# Patient Record
Sex: Male | Born: 1975
Health system: Southern US, Community
[De-identification: ages and names within clinical notes are randomized; demographics above are authoritative.]

## PROBLEM LIST (undated history)

## (undated) DIAGNOSIS — R600 Localized edema: Secondary | ICD-10-CM

## (undated) DIAGNOSIS — I10 Essential (primary) hypertension: Secondary | ICD-10-CM

## (undated) DIAGNOSIS — Z87442 Personal history of urinary calculi: Secondary | ICD-10-CM

## (undated) DIAGNOSIS — H269 Unspecified cataract: Secondary | ICD-10-CM

## (undated) DIAGNOSIS — E1121 Type 2 diabetes mellitus with diabetic nephropathy: Secondary | ICD-10-CM

## (undated) DIAGNOSIS — A630 Anogenital (venereal) warts: Secondary | ICD-10-CM

## (undated) DIAGNOSIS — N186 End stage renal disease: Secondary | ICD-10-CM

## (undated) DIAGNOSIS — C4490 Unspecified malignant neoplasm of skin, unspecified: Secondary | ICD-10-CM

## (undated) DIAGNOSIS — B348 Other viral infections of unspecified site: Secondary | ICD-10-CM

## (undated) DIAGNOSIS — Z7960 Long term (current) use of unspecified immunomodulators and immunosuppressants: Secondary | ICD-10-CM

## (undated) DIAGNOSIS — Z9889 Other specified postprocedural states: Secondary | ICD-10-CM

## (undated) DIAGNOSIS — Z79899 Other long term (current) drug therapy: Secondary | ICD-10-CM

## (undated) DIAGNOSIS — E109 Type 1 diabetes mellitus without complications: Secondary | ICD-10-CM

## (undated) DIAGNOSIS — E11319 Type 2 diabetes mellitus with unspecified diabetic retinopathy without macular edema: Secondary | ICD-10-CM

## (undated) HISTORY — PX: CYST REMOVAL TRUNK: SHX6283

## (undated) HISTORY — PX: KIDNEY TRANSPLANT: SHX239

## (undated) HISTORY — DX: Unspecified cataract: H26.9

## (undated) HISTORY — DX: Essential (primary) hypertension: I10

---
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
## 2007-05-19 ENCOUNTER — Emergency Department: Payer: Self-pay | Admitting: Emergency Medicine

## 2007-06-30 ENCOUNTER — Emergency Department: Payer: Self-pay | Admitting: Internal Medicine

## 2007-10-14 ENCOUNTER — Emergency Department: Payer: Self-pay | Admitting: Emergency Medicine

## 2007-10-15 ENCOUNTER — Emergency Department: Payer: Self-pay | Admitting: Emergency Medicine

## 2007-10-16 ENCOUNTER — Inpatient Hospital Stay: Payer: Self-pay | Admitting: Internal Medicine

## 2008-05-17 ENCOUNTER — Emergency Department: Payer: Self-pay | Admitting: Emergency Medicine

## 2009-06-11 ENCOUNTER — Emergency Department: Payer: Self-pay | Admitting: Emergency Medicine

## 2009-10-10 ENCOUNTER — Emergency Department: Payer: Self-pay | Admitting: Emergency Medicine

## 2010-08-09 ENCOUNTER — Other Ambulatory Visit: Payer: Self-pay | Admitting: Internal Medicine

## 2010-08-19 ENCOUNTER — Other Ambulatory Visit: Payer: Self-pay | Admitting: Internal Medicine

## 2010-11-15 ENCOUNTER — Other Ambulatory Visit: Payer: Self-pay | Admitting: Internal Medicine

## 2010-11-20 ENCOUNTER — Other Ambulatory Visit: Payer: Self-pay | Admitting: Internal Medicine

## 2011-03-08 ENCOUNTER — Emergency Department: Payer: Self-pay | Admitting: *Deleted

## 2012-09-18 ENCOUNTER — Emergency Department: Payer: Self-pay | Admitting: Emergency Medicine

## 2013-05-12 DIAGNOSIS — Z9841 Cataract extraction status, right eye: Secondary | ICD-10-CM

## 2013-05-12 HISTORY — DX: Cataract extraction status, right eye: Z98.41

## 2013-05-12 HISTORY — PX: CATARACT EXTRACTION W/ INTRAOCULAR LENS IMPLANT: SHX1309

## 2014-07-07 ENCOUNTER — Observation Stay: Payer: Self-pay | Admitting: Internal Medicine

## 2014-07-15 ENCOUNTER — Emergency Department: Payer: Self-pay | Admitting: Emergency Medicine

## 2014-08-09 ENCOUNTER — Ambulatory Visit: Payer: Self-pay | Admitting: Endocrinology

## 2014-09-10 NOTE — Discharge Summary (Signed)
PATIENT NAME:  Gabriel Huff, Gabriel Huff MR#:  [id number] DATE OF BIRTH:  10-Aug-1975  DATE OF ADMISSION:  07/07/2014 DATE OF DISCHARGE:  07/09/2014  DISCHARGE DIAGNOSES: 1.  Hypoglycemia, noncompliance. 2.  Acute on chronic kidney disease stage IV.    PROCEDURES: None.   CONSULTATIONS: Dr. Holley Raring, nephrology.  BRIEF HISTORY AND PHYSICAL AND HOSPITAL COURSE BASED ON PROBLEM:   Please review history and physical for details.  1.  Hypoglycemia. The patient is a 39 year old Caucasian male with a history of diabetes mellitus, dependent on insulin, who was brought into the ED as he was acting funny at his workplace and found to have a low blood sugar, in the mid 30s. The patient was given 0.5 amp of dextrose following which the blood sugar increased to 102, but again it dropped down to 55. The patient's BUN was at 34 and creatinine 3.58. The patient was having intermittent episodes of low blood sugars 2 days prior to the admission. He denies any urinary symptoms, nausea, vomiting. The patient was admitted to the hospital with the chief complaint of hypoglycemia. The patient was started on D5 half normal saline and blood sugars were monitored. His insulins were held. His Accu-Cheks were monitored closely, every 2 hours. The patient admitted he is not compliant with his insulin. He is supposed to take Humulin N 20 units in the a.m. and 15 units at bedtime, but admits to not taking insulin sometimes and sometimes he skips meals and takes insulin, which might be the reason for hypoglycemic episodes. By the next day, his hypoglycemia was resolved. D5 drip was discontinued The patient was started on NPH insulin at 10 units subcutaneously twice a day as he was having hypoglycemic episodes recently. The patient was not taking his meals on a regular basis. We have reinforced the importance of being compliant with his meals and taking insulin on a regular basis. The patient verbalized understanding. A consult with a dietician was  ordered to provide details about diabetic diet, but the patient has refused as he is familiar with diabetic diet. His NPH insulin was not available in the pharmacy and he was given Levemir and at the time of discharge he was asked to switch back to his home insulin NPH 10 units subcutaneous b.i.d. His diabetes mellitus was uncontrolled with hemoglobin A1c at 9, which is probably secondary to noncompliance.  2.  Hypertension. Blood pressure was elevated. His Coreg dose was increased to 12.5 mg, and nephrology has ordered losartan following which his blood pressure was better   3.  Renal failure. The patient has acute on chronic renal failure. His baseline creatinine was at 3.3. IV fluids were provided. Nephrotoxic agents were discontinued. Nephrology consult was placed. They have recommended the patient to follow up with them as an outpatient on a regular basis. The patient admitted that he is not seen by nephrology for more than 8 months because he does not have insurance. Dr. Elwyn Lade group is providing charity care and sliding scale insulin. We have encouraged the patient to follow up with nephrology as an outpatient. The patient is aware that Dr. Elwyn Lade nephrology group is offering a helping hand by doing charity care and sliding scale.  CONDITION AT DISCHARGE: The patient is discharged home in stable condition.  PHYSICAL EXAMINATION: VITAL SIGNS: At the time of discharge, his temperature is 97.8, pulse 78, respirations 18, blood pressure 134/80, and pulse ox is 98% on room air.  GENERAL APPEARANCE: Not in any distress. Moderately built and nourished.  HEENT: Normocephalic, atraumatic. Pupils are equally reacting to light and accommodation. No scleral icterus. No conjunctival injection. No sinus tenderness. No postnasal drip. Moist mucous membranes.  NECK: Supple. No JVD. No thyromegaly. Range of motion is intact.  LUNGS: Clear to auscultation bilaterally. No accessory muscle usage. No anterior chest  wall tenderness on palpation.  CARDIOVASCULAR: S1 and S2. Regular rate and rhythm. No murmurs.  GASTROINTESTINAL: Abdominal soft. Bowel sounds are positive. Nontender, nondistended. No masses.  NEUROLOGIC: Awake, alert, and oriented x3. Cranial nerves II through XII are grossly intact. Motor and sensory are intact. Reflexes are 2+.  EXTREMITIES: No edema. No cyanosis. No clubbing.  SKIN: Warm to touch. Normal turgor. No rashes. No lesions.   DIAGNOSTIC DATA: EKG: Normal EKG at 76 beats per minute.  On 27th February glucose 122, BUN 32, creatinine 3.55. Sodium and potassium are normal. Chloride 112. CO2 24. Anion gap 8.  Serum osmolality 95. Calcium 8.1. Hemoglobin A1c 9. Phosphorus 3. WBC 6.4, hemoglobin 9.8, hematocrit 39.8, platelets 231,000.  DISCHARGE MEDICATIONS: Gentlemen's therapeutic multivitamin 1 tablet p.o. once daily, Humulin N (NPH) 10  units subcutaneously 2 times a day, Coreg 12.5 mg p.o. b.i.d., losartan 50 mg p.o. once daily, insulin Aspart subcutaneously 3 times a day with meals. Follow protocol: Less than 150 no coverage, 151 to 200 two units, 201 to 250 four units, 251 to 300 six units; 301 to 350 eight units and 351 to  400 ten units.   DISCHARGE DIET: Low-sodium, carb-controlled. Regular consistency.   DISCHARGE ACTIVITY: As tolerated.   DISCHARGE INSTRUCTIONS: Follow up with primary care physician in a week, Dr. Juleen China from nephrology group in 2 to 4 weeks, outpatient diabetic clinic in a week.   The diagnosis and plan of care was discussed in detail with the patient who voiced understanding of the plan. Care management was consulted regarding medicine assistance and outpatient followup. All the patient's questions were answered.   TOTAL TIME SPENT ON DISCHARGE AND COORDINATION OF CARE: 45 minutes.  ____________________________ Nicholes Mango, MD ag:sb D: 07/13/2014 22:22:47 ET T: 07/14/2014 09:28:26 ET JOB#: [id number]  cc: Nicholes Mango, MD, <Dictator> Nicholes Mango  MD ELECTRONICALLY SIGNED 07/25/2014 20:43

## 2014-09-10 NOTE — H&P (Signed)
PATIENT NAME:  Gabriel Huff, Gabriel Huff MR#:  [id number] DATE OF BIRTH:  10/11/1975  DATE OF ADMISSION:  07/07/2014  REFERRING PHYSICIAN: Debbrah Alar, MD.  PRIMARY CARE PRACTITIONER: Nonlocal.   ADMITTING PHYSICIAN: Juluis Mire, MD    CHIEF COMPLAINT: Acting funny at his workplace and found to have low blood sugars of mid 30s.   HISTORY OF PRESENT ILLNESS: A 39 year old Caucasian male with a history of diabetes mellitus on insulin, hypertension, was brought by the EMS with a history of acting funny at workplace and was found to have blood glucose in the mid 30s. The patient was given 1/2 ampule of dextrose, following which her blood sugar increased to 102. On arrival to the Emergency Room, the patient was again noted to have blood sugars of 55. By the time he had arrived to the Emergency Room, he was alert, awake, and oriented, but he continues to run low blood sugars. Work-up in the Emergency Room revealed low blood sugars on Accu-Cheks, as mentioned earlier, and his Chem-7 was remarkable for renal failure with a BUN of 34 and a creatinine of 3.58. The patient was given further D50 ampules in the Emergency Room and currently, blood sugars are maintaining around 100-108. The patient is alert, awake, and oriented. The patient states that he took his usual doses of insulin this morning, but did not have adequate food intake and went to work, following which, the events happened. He did have an episode of low blood sugars about 2 days ago, during which time, EMS was called and found the blood sugars to be low. Regarding his renal insufficiency, the patient states that he has seen a nephrologist a few weeks ago and was told that  he has renal insufficiency and he is close to being initiated on dialysis and further details of the work-up are not known at this time. Denies any fever, cough. No history of any nausea, vomiting, diarrhea, or illness recently. No urinary symptoms. The patient is alert, awake,  and oriented x 3 and resting comfortably in the bed at this time.   PAST MEDICAL HISTORY: 1. Hypertension.  2. Diabetes mellitus type 2, on insulin.    PAST SURGICAL HISTORY:  Right breast surgery for gynecomastia.   ALLERGIES: No known drug allergies.   HOME MEDICATIONS:  1.  Carvedilol 6.5 mg tablet, 1 tablet orally 2 times a day.  2.  Centrum multivitamin tablet, 1 tablet orally once a day.  3.  Humulin N 20 units subcutaneously once in the morning.   FAMILY HISTORY: Mother with diabetes mellitus and coronary artery disease.   SOCIAL HISTORY: He is single, works as an Clinical biochemist, and denies any history of smoking, alcohol, or substance abuse.    REVIEW OF SYSTEMS: CONSTITUTIONAL: Negative for fever, chills. No fatigue. No generalized weakness. EYES: Negative for blurred vision, double vision. No pain. No redness. No discharge.  EARS, NOSE, AND THROAT: Negative for tinnitus, ear pain, hearing loss, epistaxis, nasal discharge, or difficulty swallowing.  RESPIRATORY: Negative for cough, wheezing, dyspnea, hemoptysis, or painful respiration.  CARDIOVASCULAR: Negative for chest pain, palpitations, dizziness, syncopal episodes, orthopnea, dyspnea on exertion, pedal edema.  GASTROINTESTINAL: Negative for nausea, vomiting, diarrhea, abdominal pain, hematemesis, melena, rectal bleeding, or GERD symptoms.  GENITOURINARY: Negative for dysuria, frequency, urgency, or hematuria.  ENDOCRINE: Negative for polyuria, nocturia, or heat or cold intolerance.  HEMATOLOGIC AND LYMPHATIC: Negative for anemia, easy bruising, bleeding, or swollen glands.  ENDOCRINE: Negative for acne, skin rash, or lesions.  MUSCULOSKELETAL:  Negative for neck or back pain. No history of arthritis or gout.  NEUROLOGICAL: Negative for focal weakness, numbness. No history of CVA, TIA, or seizure disorder.  PSYCHIATRIC: Negative for anxiety, insomnia, depression.   PHYSICAL EXAMINATION: VITAL SIGNS: Temperature 97.6,  respirations 20 per minute, blood pressure 191/106, and pulse oximetry 100% on room air.  GENERAL: Well-developed, well-nourished, alert, in no acute distress, comfortably resting in the bed.  HEAD: Atraumatic, normocephalic.  EYES: Pupils are equal, react to light and accommodation. No conjunctivae. No icterus. Extraocular movements intact.  NOSE: No drainage. No lesions.  EARS: No drainage. No external lesions.  ORAL CAVITY: No mucosal lesions. No exudates.  NECK: Supple. No JVD. No thyromegaly. No carotid bruit. Range of motion of neck within normal limits.  RESPIRATORY: Good respiratory effort. Not using accessory muscles of respiration. Bilateral vesicular breath sounds and no rales or rhonchi.  CARDIOVASCULAR: S1, S2 regular. No murmurs, gallops, or clicks. Peripheral pulses equal at carotid, femoral, and pedal pulses, no peripheral edema.  GASTROINTESTINAL: Abdomen is soft and nontender. No hepatosplenomegaly. No masses noted. No guarding. Bowel sounds present and equal in all 4 quadrants.  GENITOURINARY: Deferred.  MUSCULOSKELETAL: No joint tenderness or effusion. Range of motion is adequate. Strength and tone equal bilaterally.  SKIN: Inspection within normal limits. No obvious wounds.  LYMPHATIC: No cervical lymphadenopathy.  VASCULAR: Good dorsalis pedis and posterior tibial pulses.  NEUROLOGICAL: Alert, awake, and oriented x 3. Cranial nerves II through XII are grossly intact. No sensory deficit. Motor strength is 5/5 in both upper and lower extremities. DTR are 2+ bilaterally and symmetrical. Plantars downgoing.  PSYCHIATRIC: Alert, awake, and oriented x 3, Judgment and insight adequate. Memory and mood within normal limits.   ANCILLARY DATA:   LABORATORY DATA: Blood glucose and point of test Accu-Cheks 55 on arrival and repeat in 1 hour of 108, serum glucose 125, BUN 34, creatinine 3.58, sodium 143, potassium 4.4, chloride 110, bicarbonate 26, total calcium 8.5. WBC 7.1, hemoglobin  10.3, hematocrit 31.2, platelet count 235,000.   EKG normal sinus rhythm with ventricular rate of 76 beats per minute. No acute ST or T changes.   ASSESSMENT AND PLAN: A 39 year old Caucasian male with a history of diabetes mellitus type 2, hypertension, presents with acting funny at work, found to have low blood sugars in the mid-30s, also noted to have renal insufficiency, probably acute on chronic, admitted for observation for further evaluation and management.  1.  Hypoglycemia: Recurrent, secondary to inadequate oral intake following taking his usual dosage of insulin this morning.  Plan: Admit to medical floor for observation. Hold insulin for now. Begin D5 1/2 normal saline. Monitor blood sugars hourly and follow up accordingly.  2.  Renal failure: Likely acute on chronic. The patient was seen by nephrology a few weeks as an outpatient and was told he is close to being initiated on dialysis.  Work-up done at the nephrologist's office not known at this time. Plan: Gentle IV hydration. Follow BMP. Avoid nephrotoxic agents. Nephrology consultation requested.  3.  Diabetes mellitus on insulin: Now with hypoglycemia. Plan: We hold insulin for now and start sliding scale insulin.  4.  Hypertension: On medications, blood pressure poorly controlled. Plan:  Continue home medications and adjust medications for optimal control of blood pressure.  5.  Deep vein thrombosis prophylaxis subcutaneous heparin.  6. Gastrointestinal. PPI.   CODE STATUS: Full code.   TIME SPENT: 50 minutes.     ____________________________ Juluis Mire, MD enr:mw D: 07/07/2014  15:12:37 ET T: 07/07/2014 15:39:06 ET JOB#: [id number]  cc: Juluis Mire, MD, <Dictator> PRIMARY CARE PRACTITIONER Juluis Mire MD ELECTRONICALLY SIGNED 07/12/2014 8:38

## 2014-11-14 ENCOUNTER — Telehealth: Payer: Self-pay | Admitting: Unknown Physician Specialty

## 2014-11-14 DIAGNOSIS — N19 Unspecified kidney failure: Secondary | ICD-10-CM

## 2014-11-14 DIAGNOSIS — E1022 Type 1 diabetes mellitus with diabetic chronic kidney disease: Secondary | ICD-10-CM

## 2014-11-14 DIAGNOSIS — IMO0002 Reserved for concepts with insufficient information to code with codable children: Secondary | ICD-10-CM

## 2014-11-14 DIAGNOSIS — I10 Essential (primary) hypertension: Secondary | ICD-10-CM

## 2014-11-14 DIAGNOSIS — E1159 Type 2 diabetes mellitus with other circulatory complications: Secondary | ICD-10-CM | POA: Insufficient documentation

## 2014-11-14 DIAGNOSIS — Z940 Kidney transplant status: Secondary | ICD-10-CM | POA: Insufficient documentation

## 2014-11-14 DIAGNOSIS — I129 Hypertensive chronic kidney disease with stage 1 through stage 4 chronic kidney disease, or unspecified chronic kidney disease: Secondary | ICD-10-CM

## 2014-11-14 DIAGNOSIS — E119 Type 2 diabetes mellitus without complications: Secondary | ICD-10-CM | POA: Insufficient documentation

## 2014-11-14 DIAGNOSIS — E1065 Type 1 diabetes mellitus with hyperglycemia: Secondary | ICD-10-CM | POA: Insufficient documentation

## 2014-11-14 NOTE — Telephone Encounter (Signed)
Pt's father called in and stated that he was in last appt that he needed to go to a diabetic doctor and due to lack of insurance he was unable to until now. Pt now has insurance and is wanting a referral to go to [facility] to see dr. Gracy Bruins colum phone number is [phone]. Pt's dad is requesting a call back at [phone]. i scheduled an appt for pt July 15th just in case they needed to come in.

## 2014-11-14 NOTE — Telephone Encounter (Signed)
Routing to provider  

## 2014-11-15 NOTE — Telephone Encounter (Signed)
Called and let patient's father know about referral.

## 2014-11-15 NOTE — Telephone Encounter (Signed)
Referral entered  

## 2014-11-23 ENCOUNTER — Telehealth: Payer: Self-pay | Admitting: Unknown Physician Specialty

## 2014-11-23 NOTE — Telephone Encounter (Signed)
Routing to provider  

## 2014-11-24 ENCOUNTER — Ambulatory Visit: Payer: Medicaid Other | Admitting: Unknown Physician Specialty

## 2014-11-24 NOTE — Telephone Encounter (Signed)
Called and scheduled patient an appointment on 11/29/14.

## 2014-11-24 NOTE — Telephone Encounter (Signed)
Actually patient is wanting to establish with Gabriel Huff; I discharged him at last visit; discussed with office manager

## 2014-11-24 NOTE — Telephone Encounter (Signed)
Since I don't know him, I would have to see him.

## 2014-11-24 NOTE — Telephone Encounter (Signed)
Dr. Sanda Klein is his primary

## 2014-11-29 ENCOUNTER — Telehealth: Payer: Self-pay | Admitting: Unknown Physician Specialty

## 2014-11-29 ENCOUNTER — Ambulatory Visit: Payer: Self-pay | Admitting: Unknown Physician Specialty

## 2014-11-29 NOTE — Telephone Encounter (Signed)
Patient did not show up for the visit with Malachy Mood today. Would you be willing to write this?

## 2014-11-29 NOTE — Telephone Encounter (Signed)
Pt's father called stated that he needs Gabriel Huff to write a letter stating that the pt is unable to work so that he can get food stamp benefits. Pt's father stated he has 14% function in his kidneys. Letter would have to be on [place]. Please call if further information is needed. Pt is unable to work due to dialysis. Please call when letter is ready for pick up. Thanks.

## 2014-11-29 NOTE — Telephone Encounter (Signed)
I am forwarding to the office manager I ended my relationship with the patient at his last visit with me I only put in the referral in Cheryl's absence while she was out of the office

## 2014-11-30 ENCOUNTER — Ambulatory Visit
Admission: RE | Admit: 2014-11-30 | Discharge: 2014-11-30 | Disposition: A | Discharge status: Home / Self Care | Payer: Medicaid Other | Source: Ambulatory Visit | Attending: Vascular Surgery | Admitting: Vascular Surgery

## 2014-11-30 DIAGNOSIS — Z01812 Encounter for preprocedural laboratory examination: Secondary | ICD-10-CM | POA: Insufficient documentation

## 2014-11-30 DIAGNOSIS — N186 End stage renal disease: Secondary | ICD-10-CM | POA: Insufficient documentation

## 2014-11-30 DIAGNOSIS — I10 Essential (primary) hypertension: Secondary | ICD-10-CM

## 2014-11-30 DIAGNOSIS — I120 Hypertensive chronic kidney disease with stage 5 chronic kidney disease or end stage renal disease: Secondary | ICD-10-CM | POA: Diagnosis not present

## 2014-11-30 DIAGNOSIS — Z7982 Long term (current) use of aspirin: Secondary | ICD-10-CM | POA: Diagnosis not present

## 2014-11-30 DIAGNOSIS — Z01818 Encounter for other preprocedural examination: Secondary | ICD-10-CM | POA: Diagnosis present

## 2014-11-30 DIAGNOSIS — E1122 Type 2 diabetes mellitus with diabetic chronic kidney disease: Secondary | ICD-10-CM | POA: Insufficient documentation

## 2014-11-30 DIAGNOSIS — Z992 Dependence on renal dialysis: Secondary | ICD-10-CM | POA: Diagnosis not present

## 2014-11-30 DIAGNOSIS — Z794 Long term (current) use of insulin: Secondary | ICD-10-CM | POA: Diagnosis not present

## 2014-11-30 DIAGNOSIS — Z79899 Other long term (current) drug therapy: Secondary | ICD-10-CM | POA: Diagnosis not present

## 2014-11-30 DIAGNOSIS — E119 Type 2 diabetes mellitus without complications: Secondary | ICD-10-CM

## 2014-11-30 DIAGNOSIS — N185 Chronic kidney disease, stage 5: Secondary | ICD-10-CM | POA: Diagnosis present

## 2014-11-30 LAB — BASIC METABOLIC PANEL
ANION GAP: 6 (ref 5–15)
BUN: 65 mg/dL — AB (ref 6–20)
CALCIUM: 9.1 mg/dL (ref 8.9–10.3)
CO2: 25 mmol/L (ref 22–32)
Chloride: 108 mmol/L (ref 101–111)
Creatinine, Ser: 5.35 mg/dL — ABNORMAL HIGH (ref 0.61–1.24)
GFR calc Af Amer: 14 mL/min — ABNORMAL LOW (ref >60)
GFR calc non Af Amer: 12 mL/min — ABNORMAL LOW (ref >60)
GLUCOSE: 180 mg/dL — AB (ref 65–99)
POTASSIUM: 4.7 mmol/L (ref 3.5–5.1)
SODIUM: 139 mmol/L (ref 135–145)

## 2014-11-30 LAB — CBC
HCT: 27.6 % — ABNORMAL LOW (ref 40.0–52.0)
Hemoglobin: 9.3 g/dL — ABNORMAL LOW (ref 13.0–18.0)
MCH: 29.5 pg (ref 26.0–34.0)
MCHC: 33.7 g/dL (ref 32.0–36.0)
MCV: 87.7 fL (ref 80.0–100.0)
Platelets: 239 10*3/uL (ref 150–440)
RBC: 3.15 MIL/uL — ABNORMAL LOW (ref 4.40–5.90)
RDW: 13.7 % (ref 11.5–14.5)
WBC: 6.6 10*3/uL (ref 3.8–10.6)

## 2014-11-30 LAB — PROTIME-INR
INR: 1.05
Prothrombin Time: 13.9 seconds (ref 11.4–15.0)

## 2014-11-30 LAB — APTT: APTT: 27 s (ref 24–36)

## 2014-11-30 NOTE — Patient Instructions (Signed)
  Your procedure is scheduled on: Wednesday December 06, 2014 Report to Same Day Surgery. To find out your arrival time please call [phone] between 1PM - 3PM on Tuesday July 26. 2016.  Remember: Instructions that are not followed completely may result in serious medical risk, up to and including death, or upon the discretion of your surgeon and anesthesiologist your surgery may need to be rescheduled.    __x__ 1. Do not eat food or drink liquids after midnight. No gum chewing or hard candies.     ____ 2. No Alcohol for 24 hours before or after surgery.   ____ 3. Bring all medications with you on the day of surgery if instructed.    _x___ 4. Notify your doctor if there is any change in your medical condition     (cold, fever, infections).     Do not wear jewelry, make-up, hairpins, clips or nail polish.  Do not wear lotions, powders, or perfumes. You may wear deodorant.  Do not shave 48 hours prior to surgery. Men may shave face and neck.  Do not bring valuables to the hospital.    [facility] is not responsible for any belongings or valuables.               Contacts, dentures or bridgework may not be worn into surgery.  Leave your suitcase in the car. After surgery it may be brought to your room.  For patients admitted to the hospital, discharge time is determined by your  treatment team.   Patients discharged the day of surgery will not be allowed to drive home.    Please read over the following fact sheets that you were given:   [facility] Preparing for Surgery  __x__ Take these medicines the morning of surgery with A SIP OF WATER:    1. amLODipine (NORVASC)     ____ Fleet Enema (as directed)   __x__ Use CHG Soap as directed  ____ Use inhalers on the day of surgery  ____ Stop metformin 2 days prior to surgery    __x_ Take 1/2 of usual insulin dose the night before surgery and none on the morning of surgery.   ____ Stop Coumadin/Plavix/aspirin on does not  apply.  ____ Stop Anti-inflammatories on does not apply. Tylenol is ok to take for pain.   ____ Stop supplements until after surgery.    ____ Bring C-Pap to the hospital.

## 2014-12-05 ENCOUNTER — Ambulatory Visit (INDEPENDENT_AMBULATORY_CARE_PROVIDER_SITE_OTHER): Payer: Medicaid Other | Admitting: Unknown Physician Specialty

## 2014-12-05 ENCOUNTER — Telehealth: Payer: Self-pay | Admitting: Unknown Physician Specialty

## 2014-12-05 ENCOUNTER — Encounter: Payer: Self-pay | Admitting: Unknown Physician Specialty

## 2014-12-05 VITALS — BP 160/101 | HR 77 | Temp 97.9°F | Ht 67.2 in | Wt 154.4 lb

## 2014-12-05 DIAGNOSIS — N19 Unspecified kidney failure: Secondary | ICD-10-CM | POA: Diagnosis not present

## 2014-12-05 DIAGNOSIS — IMO0002 Reserved for concepts with insufficient information to code with codable children: Secondary | ICD-10-CM

## 2014-12-05 DIAGNOSIS — Z23 Encounter for immunization: Secondary | ICD-10-CM

## 2014-12-05 DIAGNOSIS — N189 Chronic kidney disease, unspecified: Secondary | ICD-10-CM

## 2014-12-05 DIAGNOSIS — I129 Hypertensive chronic kidney disease with stage 1 through stage 4 chronic kidney disease, or unspecified chronic kidney disease: Secondary | ICD-10-CM | POA: Diagnosis not present

## 2014-12-05 DIAGNOSIS — E1065 Type 1 diabetes mellitus with hyperglycemia: Secondary | ICD-10-CM | POA: Diagnosis not present

## 2014-12-05 DIAGNOSIS — E1022 Type 1 diabetes mellitus with diabetic chronic kidney disease: Secondary | ICD-10-CM | POA: Diagnosis not present

## 2014-12-05 MED ORDER — GLUCOSE BLOOD VI STRP: 1.0000 | ORAL_STRIP | Status: DC | PRN

## 2014-12-05 NOTE — Assessment & Plan Note (Signed)
Elevated.  Being followed by Dr. Abigail Butts

## 2014-12-05 NOTE — Assessment & Plan Note (Addendum)
Gave an appointment for an endocrinologist.

## 2014-12-05 NOTE — Progress Notes (Signed)
BP 160/101 mmHg  Pulse 77  Temp(Src) 97.9 F (36.6 C)  Ht 5' 7.2" (1.707 m)  Wt 154 lb 6.4 oz (70.035 kg)  BMI 24.04 kg/m2  SpO2 99%   Subjective:    Patient ID: Gabriel Mannan Sr., male    DOB: 20-May-1975, 39 y.o.   MRN: [id number]  HPI: Gabriel ETZEL Sr. is a 39 y.o. male  Chief Complaint  Patient presents with  . Letter for School/Work    pt states he needs a letter for food stamps because he is unable to work. Pt's father states pt is starting dialysis tomorrow   Pt is here with his father stating he is unable to work due to multiple medical problems so he can be eligible for food stamps.    Pt with poorly controlled DM and now on renal failure.  He is currently taking 16 units of Novolog and the other is on a sliding scale.  His nephrologist is currently managing his DM.  He is trying to find an Endocrinologist who will see Medicaid.    He is getting dialysis tomorrow.    Relevant past medical, surgical, family and social history reviewed and updated as indicated. Interim medical history since our last visit reviewed. Allergies and medications reviewed and updated.  Review of Systems  Per HPI unless specifically indicated above     Objective:    BP 160/101 mmHg  Pulse 77  Temp(Src) 97.9 F (36.6 C)  Ht 5' 7.2" (1.707 m)  Wt 154 lb 6.4 oz (70.035 kg)  BMI 24.04 kg/m2  SpO2 99%  Wt Readings from Last 3 Encounters:  12/05/14 154 lb 6.4 oz (70.035 kg)  11/30/14 155 lb (70.308 kg)  09/13/14 153 lb (69.4 kg)    Physical Exam  Constitutional: He is oriented to person, place, and time. He appears well-developed and well-nourished. No distress.  HENT:  Head: Normocephalic and atraumatic.  Eyes: Conjunctivae and lids are normal. Right eye exhibits no discharge. Left eye exhibits no discharge. No scleral icterus.  Cardiovascular: Normal rate, regular rhythm and normal heart sounds.   Pulmonary/Chest: Effort normal and breath sounds normal. No respiratory distress.   Abdominal: Normal appearance. There is no splenomegaly or hepatomegaly.  Musculoskeletal: Normal range of motion.  Neurological: He is alert and oriented to person, place, and time.  Skin: Skin is intact. No rash noted. No pallor.  Psychiatric: He has a normal mood and affect. His behavior is normal. Judgment and thought content normal.    Results for orders placed or performed during the hospital encounter of 11/30/14  CBC  Result Value Ref Range   WBC 6.6 3.8 - 10.6 K/uL   RBC 3.15 (L) 4.40 - 5.90 MIL/uL   Hemoglobin 9.3 (L) 13.0 - 18.0 g/dL   HCT 27.6 (L) 40.0 - 52.0 %   MCV 87.7 80.0 - 100.0 fL   MCH 29.5 26.0 - 34.0 pg   MCHC 33.7 32.0 - 36.0 g/dL   RDW 13.7 11.5 - 14.5 %   Platelets 239 150 - 440 K/uL  Basic metabolic panel  Result Value Ref Range   Sodium 139 135 - 145 mmol/L   Potassium 4.7 3.5 - 5.1 mmol/L   Chloride 108 101 - 111 mmol/L   CO2 25 22 - 32 mmol/L   Glucose, Bld 180 (H) 65 - 99 mg/dL   BUN 65 (H) 6 - 20 mg/dL   Creatinine, Ser 5.35 (H) 0.61 - 1.24 mg/dL   Calcium 9.1  8.9 - 10.3 mg/dL   GFR calc non Af Amer 12 (L) >60 mL/min   GFR calc Af Amer 14 (L) >60 mL/min   Anion gap 6 5 - 15  Protime-INR  Result Value Ref Range   Prothrombin Time 13.9 11.4 - 15.0 seconds   INR 1.05   APTT  Result Value Ref Range   aPTT 27 24 - 36 seconds      Assessment & Plan:   Problem List Items Addressed This Visit      Unprioritized   Renal failure   Relevant Orders   Tdap vaccine greater than or equal to 7yo IM   Pneumococcal polysaccharide vaccine 23-valent greater than or equal to 2yo subcutaneous/IM   Type I diabetes mellitus, uncontrolled - Primary    Gave an appointment for an endocrinologist.        Relevant Orders   Tdap vaccine greater than or equal to 7yo IM   Pneumococcal polysaccharide vaccine 23-valent greater than or equal to 2yo subcutaneous/IM   Hypertension associated with chronic kidney disease due to type 1 diabetes mellitus    Elevated.   Being followed by Dr. Renold Don for note.   Follow up plan: Return if symptoms worsen or fail to improve.  Since he is seeing nephrology and will see Endocrine, Inwill see him on an as needed basis

## 2014-12-05 NOTE — Telephone Encounter (Signed)
Routing to provider  

## 2014-12-05 NOTE — Telephone Encounter (Signed)
Pt's father called stated the pharmacy did not receive the RX for diabetic testing strips. Can RX be sent to [facility] on [address]. [place]. Thanks.

## 2014-12-06 ENCOUNTER — Encounter
Admission: RE | Disposition: A | Discharge status: Home / Self Care | Payer: Self-pay | Source: Ambulatory Visit | Attending: Vascular Surgery

## 2014-12-06 ENCOUNTER — Ambulatory Visit: Payer: Medicaid Other | Admitting: Certified Registered"

## 2014-12-06 ENCOUNTER — Ambulatory Visit
Admission: RE | Admit: 2014-12-06 | Discharge: 2014-12-06 | Disposition: A | Discharge status: Home / Self Care | Payer: Medicaid Other | Source: Ambulatory Visit | Attending: Vascular Surgery | Admitting: Vascular Surgery

## 2014-12-06 ENCOUNTER — Encounter: Payer: Self-pay | Admitting: *Deleted

## 2014-12-06 ENCOUNTER — Telehealth: Payer: Self-pay

## 2014-12-06 DIAGNOSIS — IMO0002 Reserved for concepts with insufficient information to code with codable children: Secondary | ICD-10-CM

## 2014-12-06 DIAGNOSIS — I120 Hypertensive chronic kidney disease with stage 5 chronic kidney disease or end stage renal disease: Secondary | ICD-10-CM | POA: Insufficient documentation

## 2014-12-06 DIAGNOSIS — E1065 Type 1 diabetes mellitus with hyperglycemia: Secondary | ICD-10-CM

## 2014-12-06 DIAGNOSIS — Z7982 Long term (current) use of aspirin: Secondary | ICD-10-CM | POA: Insufficient documentation

## 2014-12-06 DIAGNOSIS — Z794 Long term (current) use of insulin: Secondary | ICD-10-CM | POA: Insufficient documentation

## 2014-12-06 DIAGNOSIS — N185 Chronic kidney disease, stage 5: Secondary | ICD-10-CM | POA: Insufficient documentation

## 2014-12-06 DIAGNOSIS — Z79899 Other long term (current) drug therapy: Secondary | ICD-10-CM | POA: Insufficient documentation

## 2014-12-06 DIAGNOSIS — E1122 Type 2 diabetes mellitus with diabetic chronic kidney disease: Secondary | ICD-10-CM | POA: Insufficient documentation

## 2014-12-06 HISTORY — PX: CAPD INSERTION: SHX5233

## 2014-12-06 LAB — GLUCOSE, CAPILLARY
Glucose-Capillary: 343 mg/dL — ABNORMAL HIGH (ref 65–99)
Glucose-Capillary: 324 mg/dL — ABNORMAL HIGH (ref 65–99)
Glucose-Capillary: 134 mg/dL — ABNORMAL HIGH (ref 65–99)

## 2014-12-06 LAB — POTASSIUM: POTASSIUM: 4.6 mmol/L (ref 3.5–5.1)

## 2014-12-06 SURGERY — LAPAROSCOPIC INSERTION CONTINUOUS AMBULATORY PERITONEAL DIALYSIS  (CAPD) CATHETER
Anesthesia: General | Wound class: Clean Contaminated

## 2014-12-06 MED ORDER — BACITRACIN ZINC 500 UNIT/GM EX OINT
TOPICAL_OINTMENT | CUTANEOUS | Status: AC
  Filled 2014-12-06: qty 0.9

## 2014-12-06 MED ORDER — LIDOCAINE HCL (CARDIAC) 20 MG/ML IV SOLN
INTRAVENOUS | Status: DC | PRN
  Administered 2014-12-06: 100 mg via INTRAVENOUS

## 2014-12-06 MED ORDER — LIDOCAINE HCL 2 % IJ SOLN: 0.1000 mL | Freq: Once | INTRAMUSCULAR | Status: DC

## 2014-12-06 MED ORDER — PROPOFOL 10 MG/ML IV BOLUS
INTRAVENOUS | Status: DC | PRN
  Administered 2014-12-06: 150 mg via INTRAVENOUS

## 2014-12-06 MED ORDER — ROCURONIUM BROMIDE 100 MG/10ML IV SOLN
INTRAVENOUS | Status: DC | PRN
  Administered 2014-12-06: 40 mg via INTRAVENOUS

## 2014-12-06 MED ORDER — INSULIN ASPART PROT & ASPART (70-30 MIX) 100 UNIT/ML ~~LOC~~ SUSP
8.0000 [IU] | Freq: Once | SUBCUTANEOUS | Status: DC
  Filled 2014-12-06: qty 10

## 2014-12-06 MED ORDER — CEFAZOLIN SODIUM 1-5 GM-% IV SOLN
1.0000 g | Freq: Once | INTRAVENOUS | Status: AC
  Administered 2014-12-06: 1 g via INTRAVENOUS

## 2014-12-06 MED ORDER — SODIUM CHLORIDE 0.9 % IV SOLN
INTRAVENOUS | Status: DC
  Administered 2014-12-06: 12:00:00 via INTRAVENOUS

## 2014-12-06 MED ORDER — FENTANYL CITRATE (PF) 100 MCG/2ML IJ SOLN
INTRAMUSCULAR | Status: AC
Start: 2014-12-06 — End: 2014-12-06
  Administered 2014-12-06: 25 ug via INTRAVENOUS
  Filled 2014-12-06: qty 2

## 2014-12-06 MED ORDER — MIDAZOLAM HCL 2 MG/2ML IJ SOLN
INTRAMUSCULAR | Status: DC | PRN
  Administered 2014-12-06: 2 mg via INTRAVENOUS

## 2014-12-06 MED ORDER — SUGAMMADEX SODIUM 500 MG/5ML IV SOLN
INTRAVENOUS | Status: DC | PRN
  Administered 2014-12-06: 280 mg via INTRAVENOUS

## 2014-12-06 MED ORDER — FENTANYL CITRATE (PF) 100 MCG/2ML IJ SOLN
25.0000 ug | INTRAMUSCULAR | Status: DC | PRN
  Administered 2014-12-06 (×4): 25 ug via INTRAVENOUS

## 2014-12-06 MED ORDER — INSULIN ASPART 100 UNIT/ML ~~LOC~~ SOLN
SUBCUTANEOUS | Status: AC
  Administered 2014-12-06: 8 [IU] via SUBCUTANEOUS
  Filled 2014-12-06: qty 8

## 2014-12-06 MED ORDER — ONDANSETRON HCL 4 MG/2ML IJ SOLN
INTRAMUSCULAR | Status: AC
Start: 2014-12-06 — End: 2014-12-06
  Administered 2014-12-06: 4 mg via INTRAVENOUS
  Filled 2014-12-06: qty 2

## 2014-12-06 MED ORDER — BUPIVACAINE-EPINEPHRINE (PF) 0.25% -1:200000 IJ SOLN
INTRAMUSCULAR | Status: AC
  Filled 2014-12-06: qty 30

## 2014-12-06 MED ORDER — OXYCODONE-ACETAMINOPHEN 5-325 MG PO TABS
ORAL_TABLET | ORAL | Status: AC
  Administered 2014-12-06: 16:00:00
  Filled 2014-12-06: qty 1

## 2014-12-06 MED ORDER — PHENYLEPHRINE HCL 10 MG/ML IJ SOLN
INTRAMUSCULAR | Status: DC | PRN
  Administered 2014-12-06: 200 ug via INTRAVENOUS
  Administered 2014-12-06: 100 ug via INTRAVENOUS

## 2014-12-06 MED ORDER — FENTANYL CITRATE (PF) 100 MCG/2ML IJ SOLN
INTRAMUSCULAR | Status: DC | PRN
  Administered 2014-12-06 (×2): 50 ug via INTRAVENOUS

## 2014-12-06 MED ORDER — CEFAZOLIN SODIUM 1-5 GM-% IV SOLN
INTRAVENOUS | Status: AC
  Administered 2014-12-06: 1 g via INTRAVENOUS
  Filled 2014-12-06: qty 50

## 2014-12-06 MED ORDER — CEFAZOLIN (ANCEF) 1 G IV SOLR: 1.0000 g | INTRAVENOUS | Status: DC

## 2014-12-06 MED ORDER — ONDANSETRON HCL 4 MG/2ML IJ SOLN
INTRAMUSCULAR | Status: DC | PRN
  Administered 2014-12-06: 4 mg via INTRAVENOUS

## 2014-12-06 MED ORDER — ONDANSETRON HCL 4 MG/2ML IJ SOLN
4.0000 mg | Freq: Once | INTRAMUSCULAR | Status: AC | PRN
  Administered 2014-12-06: 4 mg via INTRAVENOUS

## 2014-12-06 SURGICAL SUPPLY — 34 items
ADAPTER CATH DIALYSIS 18.75 (CATHETERS) ×2 IMPLANT
ADAPTER CATH DIALYSIS 18.75CM (CATHETERS) ×1
BLADE CLIPPER SURG (BLADE) ×2 IMPLANT
BLADE SURG 15 STRL LF DISP TIS (BLADE) ×1 IMPLANT
BLADE SURG 15 STRL SS (BLADE) ×3
CANISTER SUCT 1200ML W/VALVE (MISCELLANEOUS) ×3 IMPLANT
CATH DLYS SWAN NECK 62.5CM (CATHETERS) ×3 IMPLANT
CHLORAPREP W/TINT 26ML (MISCELLANEOUS) ×3 IMPLANT
GLOVE SURG SYN 8.0 (GLOVE) ×9 IMPLANT
GLOVE SURG SYN 8.0 PF PI (GLOVE) ×1 IMPLANT
GOWN STRL REUS W/ TWL LRG LVL3 (GOWN DISPOSABLE) ×1 IMPLANT
GOWN STRL REUS W/ TWL XL LVL3 (GOWN DISPOSABLE) ×1 IMPLANT
GOWN STRL REUS W/TWL LRG LVL3 (GOWN DISPOSABLE) ×3
GOWN STRL REUS W/TWL XL LVL3 (GOWN DISPOSABLE) ×3
KIT RM TURNOVER STRD PROC AR (KITS) ×3 IMPLANT
LABEL OR SOLS (LABEL) ×3 IMPLANT
LIQUID BAND (GAUZE/BANDAGES/DRESSINGS) ×3 IMPLANT
MINICAP W/POVIDONE IODINE SOL (MISCELLANEOUS) ×3 IMPLANT
NDL SAFETY 25GX1.5 (NEEDLE) ×3 IMPLANT
NEEDLE INSUFFLATION 150MM (ENDOMECHANICALS) ×3 IMPLANT
NS IRRIG 500ML POUR BTL (IV SOLUTION) ×3 IMPLANT
PACK LAP CHOLECYSTECTOMY (MISCELLANEOUS) ×3 IMPLANT
PAD GROUND ADULT SPLIT (MISCELLANEOUS) ×3 IMPLANT
PENCIL ELECTRO HAND CTR (MISCELLANEOUS) ×3 IMPLANT
SET TRANSFER 6 W/TWIST CLAMP 5 (SET/KITS/TRAYS/PACK) ×3 IMPLANT
SLEEVE ENDOPATH XCEL 5M (ENDOMECHANICALS) ×3 IMPLANT
SUT ETHIBOND CT1 BRD 2-0 30IN (SUTURE) ×2 IMPLANT
SUT MNCRL AB 4-0 PS2 18 (SUTURE) ×3 IMPLANT
SUT VIC AB 3-0 SH 27 (SUTURE) ×3
SUT VIC AB 3-0 SH 27X BRD (SUTURE) ×1 IMPLANT
SUT VICRYL 0 AB UR-6 (SUTURE) ×6 IMPLANT
SYR 50ML LL SCALE MARK (SYRINGE) ×3 IMPLANT
TROCAR XCEL NON-BLD 5MMX100MML (ENDOMECHANICALS) ×3 IMPLANT
TUBING INSUFFLATOR HI FLOW (MISCELLANEOUS) ×3 IMPLANT

## 2014-12-06 NOTE — Op Note (Signed)
  OPERATIVE NOTE   PROCEDURE: 1. Laparoscopic peritoneal dialysis catheter placement.  PRE-OPERATIVE DIAGNOSIS: 1. Stage V renal insufficiency 2. Diabetes  POST-OPERATIVE DIAGNOSIS: Same  SURGEON: Schnier, Dolores Lory  ASSISTANT(S): None  ANESTHESIA: general  ESTIMATED BLOOD LOSS: Minimal   FINDING(S): 1. None  SPECIMEN(S): None  INDICATIONS:  Gabriel Tartaglione Police Sr. is a 39 y.o. male who presents with stage V renal insufficiency. The patient has decided to do peritoneal dialysis for his long-term dialysis. Risks and benefits of placement were discussed and he is agreeable to proceed.  Differences between peritoneal dialysis and hemodialysis were discussed.    DESCRIPTION: After obtaining full informed written consent, the patient was brought back to the operating room and placed supine upon the operating table. The patient received IV antibiotics prior to induction. After obtaining adequate anesthesia, the abdomen was prepped and draped in the standard fashion. A small transverse incision was created just to the left of the umbilicus and we dissected down to the fascia and placed a pursestring Vicryl suture. I then entered the peritoneum with an 60mm Optiview trocar placed in the right upper quadrant and insufflated the abdomen with carbon dioxide. I then entered the peritoneum just beside the umbilicus with a trocar and the peritoneal dialysis catheter under direct visualization. The coiled portion of the catheter was parked into the pelvis under direct laparoscopic guidance. The deep cuff was secured to the fascial pursestring suture. A small counterincision was made in the left abdomen and the catheter was brought out this site. The appropriate distal connectors were placed, and I then placed 200 cc of saline through the catheter into the pelvis. The abdomen was desufflated. Immediately, 200 cc of effluent returned through the catheter when the bag was placed to gravity. I took one  more look with the camera to ensure that the catheter was in the pelvis and it was. The 52mm trocar was then removed. I then closed the incisions with 3-0 Vicryl and 4-0 Monocryl and placed Dermabond as dressing. Dry dressing was placed around the catheter exit site. The patient was then awakened from anesthesia and taken to the recovery room in stable condition having tolerated the procedure well.  COMPLICATIONS: None  CONDITION: None  Gabriel Huff 12/06/2014, 2:44 PM

## 2014-12-06 NOTE — OR Nursing (Signed)
Blood sugar results given to Dr Boston Service

## 2014-12-06 NOTE — Transfer of Care (Signed)
Immediate Anesthesia Transfer of Care Note  Patient: Gabriel Stoetzel Brinkmeyer Sr.  Procedure(s) Performed: Procedure(s): LAPAROSCOPIC INSERTION CONTINUOUS AMBULATORY PERITONEAL DIALYSIS  (CAPD) CATHETER (N/A)  Patient Location: PACU  Anesthesia Type:General  Level of Consciousness: awake, alert , oriented and patient cooperative  Airway & Oxygen Therapy: Patient Spontanous Breathing and Patient connected to face mask oxygen  Post-op Assessment: Report given to RN, Post -op Vital signs reviewed and stable and Patient moving all extremities X 4  Post vital signs: Reviewed and stable  Last Vitals:  Filed Vitals:   12/06/14 1451  BP: 136/86  Pulse: 76  Temp:   Resp: 14    Complications: No apparent anesthesia complications

## 2014-12-06 NOTE — Op Note (Signed)
[facility] History & Physical Update  The patient was interviewed and re-examined.  The patient's previous History and Physical has been reviewed and is unchanged.  There is no change in the plan of care. We plan to proceed with the scheduled procedure.  Schnier, Dolores Lory, MD  12/06/2014, 1:25 PM

## 2014-12-06 NOTE — Anesthesia Procedure Notes (Signed)
Procedure Name: Intubation Date/Time: 12/06/2014 1:44 PM Performed by: Silvana Newness Pre-anesthesia Checklist: Patient identified, Emergency Drugs available, Suction available, Patient being monitored and Timeout performed Patient Re-evaluated:Patient Re-evaluated prior to inductionOxygen Delivery Method: Circle system utilized Preoxygenation: Pre-oxygenation with 100% oxygen Intubation Type: IV induction Ventilation: Mask ventilation without difficulty Laryngoscope Size: Mac and 4 Grade View: Grade II Tube type: Oral Tube size: 7.5 mm Number of attempts: 1 Airway Equipment and Method: Rigid stylet Placement Confirmation: ETT inserted through vocal cords under direct vision,  positive ETCO2 and breath sounds checked- equal and bilateral Secured at: 23 cm Tube secured with: Tape Dental Injury: Teeth and Oropharynx as per pre-operative assessment

## 2014-12-06 NOTE — Telephone Encounter (Signed)
Pharmacy called and stated they need an rx for the meter for the patient to test his sugar. The Accu Check plus. Pharmacy is [facility] in [place].

## 2014-12-06 NOTE — Anesthesia Postprocedure Evaluation (Signed)
  Anesthesia Post-op Note  Patient: Gabriel Speyrer Archbold Sr.  Procedure(s) Performed: Procedure(s): LAPAROSCOPIC INSERTION CONTINUOUS AMBULATORY PERITONEAL DIALYSIS  (CAPD) CATHETER (N/A)  Anesthesia type:General  Patient location: PACU  Post pain: Pain level controlled  Post assessment: Post-op Vital signs reviewed, Patient's Cardiovascular Status Stable, Respiratory Function Stable, Patent Airway and No signs of Nausea or vomiting  Post vital signs: Reviewed and stable  Last Vitals:  Filed Vitals:   12/06/14 1543  BP: 114/70  Pulse: 78  Temp: 36.4 C  Resp: 16    Level of consciousness: awake, alert  and patient cooperative  Complications: No apparent anesthesia complications Sugar down to 134 in PACU and doing well

## 2014-12-06 NOTE — Discharge Instructions (Addendum)

## 2014-12-06 NOTE — Anesthesia Preprocedure Evaluation (Addendum)
Anesthesia Evaluation  Patient identified by MRN, date of birth, ID band Patient awake    Reviewed: Allergy & Precautions, NPO status , Patient's Chart, lab work & pertinent test results  History of Anesthesia Complications Negative for: history of anesthetic complications  Airway Mallampati: II       Dental  (+) Teeth Intact   Pulmonary neg pulmonary ROS,    Pulmonary exam normal       Cardiovascular hypertension, Pt. on medications Normal cardiovascular exam    Neuro/Psych negative neurological ROS     GI/Hepatic negative GI ROS, Neg liver ROS,   Endo/Other  diabetes, Poorly Controlled, Type 1, Insulin Dependent  Renal/GU CRFRenal disease     Musculoskeletal negative musculoskeletal ROS (+)   Abdominal   Peds  Hematology negative hematology ROS (+)   Anesthesia Other Findings   Reproductive/Obstetrics                            Anesthesia Physical Anesthesia Plan  ASA: III  Anesthesia Plan: General   Post-op Pain Management:    Induction: Intravenous  Airway Management Planned: Oral ETT  Additional Equipment:   Intra-op Plan:   Post-operative Plan: Extubation in OR  Informed Consent: I have reviewed the patients History and Physical, chart, labs and discussed the procedure including the risks, benefits and alternatives for the proposed anesthesia with the patient or authorized representative who has indicated his/her understanding and acceptance.     Plan Discussed with: CRNA  Anesthesia Plan Comments:         Anesthesia Quick Evaluation

## 2014-12-11 ENCOUNTER — Other Ambulatory Visit: Payer: Self-pay

## 2014-12-11 ENCOUNTER — Other Ambulatory Visit: Payer: Self-pay | Admitting: Unknown Physician Specialty

## 2014-12-11 MED ORDER — ACCU-CHEK AVIVA PLUS W/DEVICE KIT: 1.0000 | PACK | Freq: Four times a day (QID) | Status: DC

## 2014-12-11 NOTE — Telephone Encounter (Signed)
E-fax came through for refill on: Rx: Oxycodone-acetaminophi [id number] aviva plus Rx: Denavir Rx: Valacyclovir Copy in basket.

## 2014-12-12 NOTE — Telephone Encounter (Signed)
Oxycodone was refilled on July 27th. Accu Check Meter was written on 12/11/14.

## 2014-12-27 ENCOUNTER — Telehealth: Payer: Self-pay

## 2014-12-27 NOTE — Telephone Encounter (Signed)
Patient would like referral to go to a DM doctor in [place].

## 2014-12-27 NOTE — Telephone Encounter (Signed)
I called patient back to find out who he was wanting to go see. He did not remember the person's name and stated that he had the info at home. He will call back with the provider and their phone number. Patient also no longer sees Dr. Sanda Klein, but is seeing Kathrine Haddock, [place].

## 2015-03-30 ENCOUNTER — Encounter: Payer: Self-pay | Admitting: Emergency Medicine

## 2015-03-30 ENCOUNTER — Ambulatory Visit (INDEPENDENT_AMBULATORY_CARE_PROVIDER_SITE_OTHER): Payer: Medicaid Other | Admitting: Unknown Physician Specialty

## 2015-03-30 ENCOUNTER — Encounter: Payer: Self-pay | Admitting: Unknown Physician Specialty

## 2015-03-30 ENCOUNTER — Emergency Department: Payer: Medicaid Other

## 2015-03-30 ENCOUNTER — Emergency Department
Admission: EM | Admit: 2015-03-30 | Discharge: 2015-03-30 | Disposition: A | Discharge status: Home / Self Care | Payer: Medicaid Other | Attending: Emergency Medicine | Admitting: Emergency Medicine

## 2015-03-30 VITALS — BP 127/84 | HR 86 | Temp 98.3°F | Ht 67.1 in | Wt 149.8 lb

## 2015-03-30 DIAGNOSIS — E1022 Type 1 diabetes mellitus with diabetic chronic kidney disease: Secondary | ICD-10-CM | POA: Diagnosis not present

## 2015-03-30 DIAGNOSIS — Y998 Other external cause status: Secondary | ICD-10-CM | POA: Insufficient documentation

## 2015-03-30 DIAGNOSIS — N189 Chronic kidney disease, unspecified: Secondary | ICD-10-CM | POA: Insufficient documentation

## 2015-03-30 DIAGNOSIS — M25532 Pain in left wrist: Secondary | ICD-10-CM

## 2015-03-30 DIAGNOSIS — S52502A Unspecified fracture of the lower end of left radius, initial encounter for closed fracture: Secondary | ICD-10-CM | POA: Insufficient documentation

## 2015-03-30 DIAGNOSIS — S52602A Unspecified fracture of lower end of left ulna, initial encounter for closed fracture: Secondary | ICD-10-CM | POA: Insufficient documentation

## 2015-03-30 DIAGNOSIS — Y9351 Activity, roller skating (inline) and skateboarding: Secondary | ICD-10-CM | POA: Diagnosis not present

## 2015-03-30 DIAGNOSIS — Y9289 Other specified places as the place of occurrence of the external cause: Secondary | ICD-10-CM | POA: Diagnosis not present

## 2015-03-30 DIAGNOSIS — S60212A Contusion of left wrist, initial encounter: Secondary | ICD-10-CM | POA: Insufficient documentation

## 2015-03-30 DIAGNOSIS — I129 Hypertensive chronic kidney disease with stage 1 through stage 4 chronic kidney disease, or unspecified chronic kidney disease: Secondary | ICD-10-CM | POA: Insufficient documentation

## 2015-03-30 DIAGNOSIS — S6992XA Unspecified injury of left wrist, hand and finger(s), initial encounter: Secondary | ICD-10-CM | POA: Diagnosis present

## 2015-03-30 MED ORDER — IBUPROFEN 800 MG PO TABS: 800.0000 mg | ORAL_TABLET | Freq: Three times a day (TID) | ORAL | Status: DC | PRN

## 2015-03-30 MED ORDER — IBUPROFEN 800 MG PO TABS
ORAL_TABLET | ORAL | Status: AC
  Administered 2015-03-30: 800 mg via ORAL
  Filled 2015-03-30: qty 1

## 2015-03-30 MED ORDER — OXYCODONE-ACETAMINOPHEN 5-325 MG PO TABS: 2.0000 | ORAL_TABLET | Freq: Four times a day (QID) | ORAL | Status: DC | PRN

## 2015-03-30 MED ORDER — OXYCODONE-ACETAMINOPHEN 5-325 MG PO TABS
2.0000 | ORAL_TABLET | Freq: Once | ORAL | Status: AC
  Administered 2015-03-30: 2 via ORAL

## 2015-03-30 MED ORDER — OXYCODONE-ACETAMINOPHEN 5-325 MG PO TABS
ORAL_TABLET | ORAL | Status: AC
  Administered 2015-03-30: 2 via ORAL
  Filled 2015-03-30: qty 2

## 2015-03-30 MED ORDER — IBUPROFEN 800 MG PO TABS
800.0000 mg | ORAL_TABLET | Freq: Once | ORAL | Status: AC
  Administered 2015-03-30: 800 mg via ORAL

## 2015-03-30 NOTE — ED Notes (Signed)
Pt states he was roller skating and fell on his left wrist yesterday. Large amount of swelling to that wrist

## 2015-03-30 NOTE — Discharge Instructions (Signed)
Radial Fracture  A radial fracture is a break in the radius bone, which is the long bone of the forearm that is on the same side as your thumb. Your forearm is the part of your arm that is between your elbow and your wrist. It is made up of two bones: the radius and the ulna.  Most radial fractures occur near the wrist (distal radialfracture) or near the elbow (radial head fracture). A distal radial fracture is the most common type of broken arm. This fracture usually occurs about an inch above the wrist. Fractures of the middle part of the bone are less common.  CAUSES   Falling with your arm outstretched is the most common cause of a radial fracture. Other causes include:   Car accidents.   Bike accidents.   A direct blow to the middle part of the radius.  RISK FACTORS   You may be at greater risk for a distal radial fracture if you are 60 years of age or older.   You may be at greater risk for a radial head fracture if you are:    Male.    30-40 years old.   You may be at a greater risk for all types of radial fractures if you have a condition that causes your bones to be weak or thin (osteoporosis).  SIGNS AND SYMPTOMS  A radial fracture causes pain immediately after the injury. Other signs and symptoms include:   An abnormal bend or bump in your arm (deformity).   Swelling.   Bruising.   Numbness or tingling.   Tenderness.   Limited movement.  DIAGNOSIS   Your health care provider may diagnose a radial fracture based on:   Your symptoms.   Your medical history, including any recent injury.   A physical exam. Your health care provider will look for any deformity and feel for tenderness over the break. Your health care provider will also check whether the bone is out of place.   An X-ray exam to confirm the diagnosis and learn more about the type of fracture.  TREATMENT  The goals of treatment are to get the bone in proper position for healing and to keep it from moving so it will heal over  time. Your treatment will depend on many factors, especially the type of fracture that you have.   If the fractured bone:    Is in the correct position (nondisplaced), you may only need to wear a cast or a splint.    Has a slightly displaced fracture, you may need to have the bones moved back into place manually (closed reduction) before the splint or cast is put on.   You may have a temporary splint before you have a plaster cast. The splint allows room for some swelling. After a few days, a cast can replace the splint.    You may have to wear the cast for about 6 weeks or as directed by your health care provider.    The cast may be changed after about 3 weeks or as directed by your health care provider.   After your cast is taken off, you may need physical therapy to regain full movement in your wrist or elbow.   You may need emergency surgery if you have:    A fractured bone that is out of position (displaced).    A fracture with multiple fragments (comminuted fracture).    A fracture that breaks the skin (open fracture). This type of   fracture may require surgical wires, plates, or screws to hold the bone in place.   You may have X-rays every couple of weeks to check on your healing.  HOME CARE INSTRUCTIONS   Keep the injured arm above the level of your heart while you are sitting or lying down. This helps to reduce swelling and pain.   Apply ice to the injured area:    Put ice in a plastic bag.    Place a towel between your skin and the bag.    Leave the ice on for 20 minutes, 2-3 times per day.   Move your fingers often to avoid stiffness and to minimize swelling.   If you have a plaster or fiberglass cast:    Do not try to scratch the skin under the cast using sharp or pointed objects.    Check the skin around the cast every day. You may put lotion on any red or sore areas.    Keep your cast dry and clean.   If you have a plaster splint:    Wear the splint as directed.    Loosen the elastic around  the splint if your fingers become numb and tingle, or if they turn cold and blue.   Do not put pressure on any part of your cast until it is fully hardened. Rest your cast only on a pillow for the first 24 hours.   Protect your cast or splint while bathing or showering, as directed by your health care provider. Do not put your cast or splint into water.   Take medicines only as directed by your health care provider.   Return to activities, such as sports, as directed by your health care provider. Ask your health care provider what activities are safe for you.   Keep all follow-up visits as directed by your health care provider. This is important.  SEEK MEDICAL CARE IF:   Your pain medicine is not helping.   Your cast gets damaged or it breaks.   Your cast becomes loose.   Your cast gets wet.   You have more severe pain or swelling than you did before the cast.   You have severe pain when stretching your fingers.   You continue to have pain or stiffness in your elbow or your wrist after your cast is taken off.  SEEK IMMEDIATE MEDICAL CARE IF:   You cannot move your fingers.   You lose feeling in your fingers or your hand.   Your hand or your fingers turn cold and pale or blue.   You notice a bad smell coming from your cast.   You have drainage from underneath your cast.   You have new stains from blood or drainage seeping through your cast.     This information is not intended to replace advice given to you by your health care provider. Make sure you discuss any questions you have with your health care provider.     Document Released: 10/09/2005 Document Revised: 05/19/2014 Document Reviewed: 10/21/2013  Elsevier Interactive Patient Education 2016 [employer]

## 2015-03-30 NOTE — ED Provider Notes (Signed)
[facility] Emergency Department Provider Note     Time seen: ----------------------------------------- 3:33 PM on 03/30/2015 -----------------------------------------    I have reviewed the triage vital signs and the nursing notes.   HISTORY  Chief Complaint No chief complaint on file.    HPI Gabriel Canner Allmon Sr. is a 39 y.o. male who presents ER after he fell roller skating last night. Patient states he fell with an outstretched hand and has severe pain with range of motion of his left wrist. He also has swelling of left wrist, doesn't have pain elsewhere. He denies any other injuries or complaints.   Past Medical History  Diagnosis Date  . Cataract     bilateral  . Diabetes mellitus without complication ([place])   . Hypertension   . Chronic kidney disease     Patient Active Problem List   Diagnosis Date Noted  . Poorly controlled type 1 diabetes mellitus ([place]) 12/05/2014  . Diabetes ([place]) 11/14/2014  . Renal failure 11/14/2014  . Type I diabetes mellitus, uncontrolled ([place]) 11/14/2014  . Hypertension associated with chronic kidney disease due to type 1 diabetes mellitus ([place]) 11/14/2014    Past Surgical History  Procedure Laterality Date  . Cyst removal trunk    . Cataract extraction w/ intraocular lens implant Bilateral 2015  . Gynecomastia excision  1993  . Capd insertion N/A 12/06/2014    Procedure: LAPAROSCOPIC INSERTION CONTINUOUS AMBULATORY PERITONEAL DIALYSIS  (CAPD) CATHETER;  Surgeon: Katha Cabal, MD;  Location: [facility] ORS;  Service: Vascular;  Laterality: N/A;    Allergies Losartan  Social History Social History  Substance Use Topics  . Smoking status: Never Smoker   . Smokeless tobacco: Never Used  . Alcohol Use: No    Review of Systems Constitutional: Negative for fever. Musculoskeletal: Positive for left wrist pain  Skin: There is dorsal wrist ecchymosis without laceration Neurological: Negative for headaches,  focal weakness or numbness.  10-point ROS otherwise negative.  ____________________________________________   PHYSICAL EXAM:  VITAL SIGNS: ED Triage Vitals  Enc Vitals Group     BP --      Pulse --      Resp --      Temp --      Temp src --      SpO2 --      Weight --      Height --      Head Cir --      Peak Flow --      Pain Score --      Pain Loc --      Pain Edu? --      Excl. in [place]? --     Constitutional: Alert and oriented. Well appearing and in no distress. Musculoskeletal: Left wrist swelling and tenderness, pain with range of motion of the left wrist or hand. Neurologic:  Normal speech and language. No gross focal neurologic deficits are appreciated. Speech is normal. No gait instability. Skin:  Skin is warm, dry and intact. Ecchymosis is noted dorsally on the left wrist ____________________________________________  ED COURSE:  Pertinent labs & imaging results that were available during my care of the patient were reviewed by me and considered in my medical decision making (see chart for details). Patient likely with distal radius fracture, will order pain medicine and x-rays of her left wrist.  RADIOLOGY Images were viewed by me  Left wrist x-rays IMPRESSION: 1. Minimally comminuted intra-articular distal radial fracture with neutral angulation of the distal radial articular  surface. 2. Ulnar styloid avulsion fracture. ____________________________________________  FINAL ASSESSMENT AND PLAN  Distal radius and ulnar styloid fracture  Plan: Patient with labs and imaging as dictated above. Patient fracture as dictated above, patient placed in a Velcro wrist splint. We discharged with pain medication and orthopedic referral.   Earleen Newport, MD   Earleen Newport, MD 03/30/15 [phone]

## 2015-03-30 NOTE — Progress Notes (Signed)
   BP 127/84 mmHg  Pulse 86  Temp(Src) 98.3 F (36.8 C)  Ht 5' 7.1" (1.704 m)  Wt 149 lb 12.8 oz (67.949 kg)  BMI 23.40 kg/m2  SpO2 100%   Subjective:    Patient ID: Gabriel Mannan Sr., male    DOB: 1975/12/01, 39 y.o.   MRN: [id number]  HPI: Gabriel THIM Sr. is a 39 y.o. male  Chief Complaint  Patient presents with  . Wrist Pain    pt states he has pain and swelling in left wrist. States he was roller skating and fell on it.   Injury happened last night.  Fell on outstretched arm.   Relevant past medical, surgical, family and social history reviewed and updated as indicated. Interim medical history since our last visit reviewed. Allergies and medications reviewed and updated.  Review of Systems  Per HPI unless specifically indicated above     Objective:    BP 127/84 mmHg  Pulse 86  Temp(Src) 98.3 F (36.8 C)  Ht 5' 7.1" (1.704 m)  Wt 149 lb 12.8 oz (67.949 kg)  BMI 23.40 kg/m2  SpO2 100%  Wt Readings from Last 3 Encounters:  03/30/15 149 lb 12.8 oz (67.949 kg)  11/30/14 155 lb (70.308 kg)  12/05/14 154 lb 6.4 oz (70.035 kg)    Physical Exam  Constitutional: He is oriented to person, place, and time. He appears well-developed and well-nourished. No distress.  HENT:  Head: Normocephalic and atraumatic.  Eyes: Conjunctivae and lids are normal. Right eye exhibits no discharge. Left eye exhibits no discharge. No scleral icterus.  Cardiovascular: Normal rate and regular rhythm.   Pulmonary/Chest: Effort normal. No respiratory distress.  Abdominal: Normal appearance. There is no splenomegaly or hepatomegaly.  Musculoskeletal:       Left wrist: He exhibits decreased range of motion, tenderness and swelling.  Very swollen left wrist that he is holding.    Neurological: He is alert and oriented to person, place, and time.  Skin: Skin is intact. No rash noted. No pallor.  Psychiatric: He has a normal mood and affect. His behavior is normal. Judgment and thought content  normal.    Results for orders placed or performed during the hospital encounter of 12/06/14  Glucose, capillary  Result Value Ref Range   Glucose-Capillary 343 (H) 65 - 99 mg/dL  Potassium  Result Value Ref Range   Potassium 4.6 3.5 - 5.1 mmol/L  Glucose, capillary  Result Value Ref Range   Glucose-Capillary 324 (H) 65 - 99 mg/dL  Glucose, capillary  Result Value Ref Range   Glucose-Capillary 134 (H) 65 - 99 mg/dL      Assessment & Plan:   Problem List Items Addressed This Visit    None    Visit Diagnoses    Wrist pain, acute, left    -  Primary    Refer to ER as late Friday afternoon for x-ray and spinting.          Follow up plan: Return for To ER.

## 2015-04-10 ENCOUNTER — Other Ambulatory Visit: Payer: Self-pay | Admitting: Nephrology

## 2015-04-10 DIAGNOSIS — Z7682 Awaiting organ transplant status: Secondary | ICD-10-CM

## 2015-04-16 ENCOUNTER — Ambulatory Visit: Payer: Medicaid Other

## 2015-04-23 ENCOUNTER — Ambulatory Visit: Payer: Medicaid Other

## 2015-06-21 ENCOUNTER — Ambulatory Visit: Payer: Medicaid Other | Admitting: Family Medicine

## 2015-07-27 ENCOUNTER — Ambulatory Visit: Payer: Medicaid Other | Admitting: Family Medicine

## 2015-07-27 ENCOUNTER — Encounter: Payer: Self-pay | Admitting: Emergency Medicine

## 2015-07-27 ENCOUNTER — Inpatient Hospital Stay
Admission: EM | Admit: 2015-07-27 | Discharge: 2015-07-28 | DRG: 637 | Disposition: A | Discharge status: Home / Self Care | Payer: Medicare Other | Attending: Internal Medicine | Admitting: Internal Medicine

## 2015-07-27 DIAGNOSIS — R0981 Nasal congestion: Secondary | ICD-10-CM | POA: Diagnosis present

## 2015-07-27 DIAGNOSIS — Z992 Dependence on renal dialysis: Secondary | ICD-10-CM | POA: Diagnosis not present

## 2015-07-27 DIAGNOSIS — Z888 Allergy status to other drugs, medicaments and biological substances status: Secondary | ICD-10-CM

## 2015-07-27 DIAGNOSIS — E860 Dehydration: Secondary | ICD-10-CM | POA: Diagnosis present

## 2015-07-27 DIAGNOSIS — E1110 Type 2 diabetes mellitus with ketoacidosis without coma: Secondary | ICD-10-CM | POA: Diagnosis present

## 2015-07-27 DIAGNOSIS — H269 Unspecified cataract: Secondary | ICD-10-CM | POA: Diagnosis present

## 2015-07-27 DIAGNOSIS — N186 End stage renal disease: Secondary | ICD-10-CM | POA: Diagnosis present

## 2015-07-27 DIAGNOSIS — E1022 Type 1 diabetes mellitus with diabetic chronic kidney disease: Secondary | ICD-10-CM | POA: Diagnosis present

## 2015-07-27 DIAGNOSIS — Z79899 Other long term (current) drug therapy: Secondary | ICD-10-CM | POA: Diagnosis not present

## 2015-07-27 DIAGNOSIS — D631 Anemia in chronic kidney disease: Secondary | ICD-10-CM | POA: Diagnosis present

## 2015-07-27 DIAGNOSIS — E871 Hypo-osmolality and hyponatremia: Secondary | ICD-10-CM | POA: Diagnosis present

## 2015-07-27 DIAGNOSIS — N2581 Secondary hyperparathyroidism of renal origin: Secondary | ICD-10-CM | POA: Diagnosis present

## 2015-07-27 DIAGNOSIS — Z79891 Long term (current) use of opiate analgesic: Secondary | ICD-10-CM

## 2015-07-27 DIAGNOSIS — K529 Noninfective gastroenteritis and colitis, unspecified: Secondary | ICD-10-CM | POA: Diagnosis present

## 2015-07-27 DIAGNOSIS — I959 Hypotension, unspecified: Secondary | ICD-10-CM | POA: Diagnosis present

## 2015-07-27 DIAGNOSIS — I120 Hypertensive chronic kidney disease with stage 5 chronic kidney disease or end stage renal disease: Secondary | ICD-10-CM | POA: Diagnosis present

## 2015-07-27 DIAGNOSIS — E10319 Type 1 diabetes mellitus with unspecified diabetic retinopathy without macular edema: Secondary | ICD-10-CM | POA: Diagnosis present

## 2015-07-27 DIAGNOSIS — R599 Enlarged lymph nodes, unspecified: Secondary | ICD-10-CM | POA: Diagnosis present

## 2015-07-27 DIAGNOSIS — E1010 Type 1 diabetes mellitus with ketoacidosis without coma: Principal | ICD-10-CM | POA: Diagnosis present

## 2015-07-27 HISTORY — DX: Type 1 diabetes mellitus without complications: E10.9

## 2015-07-27 HISTORY — DX: End stage renal disease: N18.6

## 2015-07-27 LAB — BASIC METABOLIC PANEL
ANION GAP: 17 — AB (ref 5–15)
ANION GAP: 9 (ref 5–15)
BUN: 64 mg/dL — ABNORMAL HIGH (ref 6–20)
BUN: 63 mg/dL — ABNORMAL HIGH (ref 6–20)
CALCIUM: 7.7 mg/dL — AB (ref 8.9–10.3)
CHLORIDE: 94 mmol/L — AB (ref 101–111)
CO2: 15 mmol/L — ABNORMAL LOW (ref 22–32)
CO2: 20 mmol/L — AB (ref 22–32)
CREATININE: 6.61 mg/dL — AB (ref 0.61–1.24)
CREATININE: 6.35 mg/dL — AB (ref 0.61–1.24)
Calcium: 7.7 mg/dL — ABNORMAL LOW (ref 8.9–10.3)
Chloride: 85 mmol/L — ABNORMAL LOW (ref 101–111)
GFR calc non Af Amer: 9 mL/min — ABNORMAL LOW (ref >60)
GFR calc non Af Amer: 10 mL/min — ABNORMAL LOW (ref >60)
GFR, EST AFRICAN AMERICAN: 11 mL/min — AB (ref >60)
GFR, EST AFRICAN AMERICAN: 11 mL/min — AB (ref >60)
Glucose, Bld: 1015 mg/dL (ref 65–99)
Glucose, Bld: 649 mg/dL (ref 65–99)
POTASSIUM: 4.6 mmol/L (ref 3.5–5.1)
Potassium: 3.6 mmol/L (ref 3.5–5.1)
SODIUM: 117 mmol/L — AB (ref 135–145)
SODIUM: 123 mmol/L — AB (ref 135–145)

## 2015-07-27 LAB — COMPREHENSIVE METABOLIC PANEL
ALT: 24 U/L (ref 17–63)
AST: 13 U/L — AB (ref 15–41)
Albumin: 2.9 g/dL — ABNORMAL LOW (ref 3.5–5.0)
Alkaline Phosphatase: 127 U/L — ABNORMAL HIGH (ref 38–126)
Anion gap: 17 — ABNORMAL HIGH (ref 5–15)
BUN: 66 mg/dL — ABNORMAL HIGH (ref 6–20)
CHLORIDE: 79 mmol/L — AB (ref 101–111)
CO2: 18 mmol/L — ABNORMAL LOW (ref 22–32)
Calcium: 7.8 mg/dL — ABNORMAL LOW (ref 8.9–10.3)
Creatinine, Ser: 6.49 mg/dL — ABNORMAL HIGH (ref 0.61–1.24)
GFR, EST AFRICAN AMERICAN: 11 mL/min — AB (ref >60)
GFR, EST NON AFRICAN AMERICAN: 10 mL/min — AB (ref >60)
Glucose, Bld: 1118 mg/dL (ref 65–99)
Potassium: 4.4 mmol/L (ref 3.5–5.1)
SODIUM: 114 mmol/L — AB (ref 135–145)
Total Bilirubin: 1.8 mg/dL — ABNORMAL HIGH (ref 0.3–1.2)
Total Protein: 5.6 g/dL — ABNORMAL LOW (ref 6.5–8.1)

## 2015-07-27 LAB — URINALYSIS COMPLETE WITH MICROSCOPIC (ARMC ONLY)
BACTERIA UA: NONE SEEN
Bilirubin Urine: NEGATIVE
Glucose, UA: >500 mg/dL — AB
Leukocytes, UA: NEGATIVE
NITRITE: NEGATIVE
PH: 5 (ref 5.0–8.0)
PROTEIN: NEGATIVE mg/dL
RBC / HPF: NONE SEEN RBC/hpf (ref 0–5)
SPECIFIC GRAVITY, URINE: 1.015 (ref 1.005–1.030)
Squamous Epithelial / LPF: NONE SEEN

## 2015-07-27 LAB — RAPID INFLUENZA A&B ANTIGENS (ARMC ONLY)
INFLUENZA A (ARMC): NEGATIVE
INFLUENZA B (ARMC): NEGATIVE

## 2015-07-27 LAB — GLUCOSE, CAPILLARY
GLUCOSE-CAPILLARY: 541 mg/dL — AB (ref 65–99)
GLUCOSE-CAPILLARY: 541 mg/dL — AB (ref 65–99)
Glucose-Capillary: >600 mg/dL (ref 65–99)

## 2015-07-27 LAB — BLOOD GAS, VENOUS
ACID-BASE DEFICIT: 7.8 mmol/L — AB (ref 0.0–2.0)
Bicarbonate: 18.9 mEq/L — ABNORMAL LOW (ref 21.0–28.0)
FIO2: 21
O2 SAT: 78.9 %
PATIENT TEMPERATURE: 37
PCO2 VEN: 43 mmHg — AB (ref 44.0–60.0)
pH, Ven: 7.25 — ABNORMAL LOW (ref 7.320–7.430)
pO2, Ven: 51 mmHg — ABNORMAL HIGH (ref 31.0–45.0)

## 2015-07-27 LAB — MAGNESIUM: MAGNESIUM: 1.8 mg/dL (ref 1.7–2.4)

## 2015-07-27 LAB — CBC
HEMATOCRIT: 25.4 % — AB (ref 40.0–52.0)
Hemoglobin: 8.2 g/dL — ABNORMAL LOW (ref 13.0–18.0)
MCH: 31.3 pg (ref 26.0–34.0)
MCHC: 32.4 g/dL (ref 32.0–36.0)
MCV: 96.5 fL (ref 80.0–100.0)
Platelets: 227 10*3/uL (ref 150–440)
RBC: 2.63 MIL/uL — AB (ref 4.40–5.90)
RDW: 16.3 % — ABNORMAL HIGH (ref 11.5–14.5)
WBC: 8 10*3/uL (ref 3.8–10.6)

## 2015-07-27 LAB — PHOSPHORUS: PHOSPHORUS: 4.5 mg/dL (ref 2.5–4.6)

## 2015-07-27 LAB — POCT RAPID STREP A: STREPTOCOCCUS, GROUP A SCREEN (DIRECT): NEGATIVE

## 2015-07-27 LAB — MRSA PCR SCREENING: MRSA by PCR: NEGATIVE

## 2015-07-27 LAB — LIPASE, BLOOD: LIPASE: 25 U/L (ref 11–51)

## 2015-07-27 MED ORDER — SODIUM CHLORIDE 0.9 % IV SOLN
INTRAVENOUS | Status: AC
  Administered 2015-07-27: 17:00:00 via INTRAVENOUS

## 2015-07-27 MED ORDER — ADULT MULTIVITAMIN W/MINERALS CH
1.0000 | ORAL_TABLET | Freq: Every day | ORAL | Status: DC
  Administered 2015-07-27 – 2015-07-28 (×2): 1 via ORAL
  Filled 2015-07-27 (×2): qty 1

## 2015-07-27 MED ORDER — CARVEDILOL 6.25 MG PO TABS
12.5000 mg | ORAL_TABLET | Freq: Two times a day (BID) | ORAL | Status: DC
  Administered 2015-07-27: 12.5 mg via ORAL
  Filled 2015-07-27 (×3): qty 2

## 2015-07-27 MED ORDER — ONDANSETRON HCL 4 MG/2ML IJ SOLN: 4.0000 mg | Freq: Four times a day (QID) | INTRAMUSCULAR | Status: DC

## 2015-07-27 MED ORDER — SODIUM CHLORIDE 0.9 % IV SOLN
INTRAVENOUS | Status: DC
  Administered 2015-07-27: 18:00:00 via INTRAVENOUS

## 2015-07-27 MED ORDER — AMLODIPINE BESYLATE 10 MG PO TABS
10.0000 mg | ORAL_TABLET | ORAL | Status: DC
  Filled 2015-07-27: qty 1

## 2015-07-27 MED ORDER — ENOXAPARIN SODIUM 40 MG/0.4ML ~~LOC~~ SOLN
40.0000 mg | SUBCUTANEOUS | Status: DC
  Administered 2015-07-27: 40 mg via SUBCUTANEOUS
  Filled 2015-07-27: qty 0.4

## 2015-07-27 MED ORDER — SODIUM CHLORIDE 0.9 % IV SOLN
1000.0000 mL | Freq: Once | INTRAVENOUS | Status: AC
  Administered 2015-07-27: 1000 mL via INTRAVENOUS

## 2015-07-27 MED ORDER — OXYCODONE-ACETAMINOPHEN 5-325 MG PO TABS: 2.0000 | ORAL_TABLET | Freq: Four times a day (QID) | ORAL | Status: DC | PRN

## 2015-07-27 MED ORDER — LOSARTAN POTASSIUM 50 MG PO TABS
50.0000 mg | ORAL_TABLET | Freq: Every day | ORAL | Status: DC
  Administered 2015-07-27: 50 mg via ORAL
  Filled 2015-07-27: qty 1

## 2015-07-27 MED ORDER — DEXTROSE-NACL 5-0.45 % IV SOLN
INTRAVENOUS | Status: DC
  Administered 2015-07-28: 03:00:00 via INTRAVENOUS

## 2015-07-27 MED ORDER — SODIUM CHLORIDE 0.9 % IV SOLN
INTRAVENOUS | Status: DC
  Administered 2015-07-27: 5.4 [IU]/h via INTRAVENOUS
  Filled 2015-07-27: qty 2.5

## 2015-07-27 MED ORDER — ONDANSETRON HCL 4 MG/2ML IJ SOLN
4.0000 mg | Freq: Four times a day (QID) | INTRAMUSCULAR | Status: DC | PRN
  Administered 2015-07-27: 4 mg via INTRAVENOUS
  Filled 2015-07-27: qty 2

## 2015-07-27 MED ORDER — ACCU-CHEK AVIVA PLUS W/DEVICE KIT: 1.0000 | PACK | Freq: Four times a day (QID) | Status: DC

## 2015-07-27 MED ORDER — POTASSIUM CHLORIDE 10 MEQ/100ML IV SOLN
10.0000 meq | INTRAVENOUS | Status: AC
Start: 2015-07-27 — End: 2015-07-27
  Administered 2015-07-27 (×2): 10 meq via INTRAVENOUS
  Filled 2015-07-27 (×2): qty 100

## 2015-07-27 NOTE — ED Provider Notes (Signed)
[facility] Emergency Department Provider Note  ____________________________________________    I have reviewed the triage vital signs and the nursing notes.   HISTORY  Chief Complaint Emesis    HPI Gabriel HARTT Sr. is a 40 y.o. male who presents with complaints of diffuse weakness, nausea vomiting and diarrhea. He reports this all started approximately 4 days ago with nausea vomiting and then progressed to diarrhea. He is diabetic but has not been taking his insulin because he has not been taking food in. Today he feels extremely weak and lightheaded. Denies fevers. Patient does do peritoneal dialysis for kidney disease     Past Medical History  Diagnosis Date  . Cataract     bilateral  . Diabetes mellitus without complication ([place])   . Hypertension   . Chronic kidney disease     Patient Active Problem List   Diagnosis Date Noted  . Poorly controlled type 1 diabetes mellitus ([place]) 12/05/2014  . Diabetes ([place]) 11/14/2014  . Renal failure 11/14/2014  . Type I diabetes mellitus, uncontrolled ([place]) 11/14/2014  . Hypertension associated with chronic kidney disease due to type 1 diabetes mellitus ([place]) 11/14/2014    Past Surgical History  Procedure Laterality Date  . Cyst removal trunk    . Cataract extraction w/ intraocular lens implant Bilateral 2015  . Gynecomastia excision  1993  . Capd insertion N/A 12/06/2014    Procedure: LAPAROSCOPIC INSERTION CONTINUOUS AMBULATORY PERITONEAL DIALYSIS  (CAPD) CATHETER;  Surgeon: Katha Cabal, MD;  Location: [facility] ORS;  Service: Vascular;  Laterality: N/A;    Current Outpatient Rx  Name  Route  Sig  Dispense  Refill  . amLODipine (NORVASC) 10 MG tablet   Oral   Take 10 mg by mouth every morning.         . Blood Glucose Monitoring Suppl (ACCU-CHEK AVIVA PLUS) W/DEVICE KIT   Does not apply   1 strip by Does not apply route 4 (four) times daily.   1 kit   12   . carvedilol (COREG) 12.5 MG  tablet   Oral   Take 12.5 mg by mouth 2 (two) times daily with a meal.         . furosemide (LASIX) 40 MG tablet   Oral   Take 40 mg by mouth every morning.      3   . glucose blood test strip   Other   1 each by Other route as needed for other. Tests 4 times/day.  E10.9   100 each   10   . ibuprofen (ADVIL,MOTRIN) 800 MG tablet   Oral   Take 1 tablet (800 mg total) by mouth every 8 (eight) hours as needed.   30 tablet   0   . insulin aspart (NOVOLOG) 100 UNIT/ML injection   Subcutaneous   Inject into the skin as needed for high blood sugar. Sliding scale insulin 0-150 no insulin 151-200 2 units 201-250 4 units 251-300 6 units 301-650 8 units 351-400 10 unit         . insulin NPH Human (HUMULIN N,NOVOLIN N) 100 UNIT/ML injection   Subcutaneous   Inject 16 Units into the skin 2 (two) times daily before a meal.          . losartan (COZAAR) 50 MG tablet   Oral   Take 50 mg by mouth daily.         . Multiple Vitamin (MULTIVITAMIN) tablet   Oral   Take 1 tablet by  mouth daily.         [name] oxyCODONE-acetaminophen (PERCOCET) 5-325 MG tablet   Oral   Take 2 tablets by mouth every 6 (six) hours as needed for moderate pain or severe pain.   30 tablet   0   . oxyCODONE-acetaminophen (PERCOCET/ROXICET) 5-325 MG per tablet   Oral   Take 1 tablet by mouth every 8 (eight) hours as needed.      0     Allergies Losartan  Family History  Problem Relation Age of Onset  . Diabetes Mother     Social History Social History  Substance Use Topics  . Smoking status: Never Smoker   . Smokeless tobacco: Never Used  . Alcohol Use: No    Review of Systems  Constitutional: Negative for fever. Eyes: Negative for redness ENT: Negative for sore throat, Positive for polydipsia Cardiovascular: Negative for chest pain Respiratory: Negative for shortness of breath. Gastrointestinal: As above, no abdominal pain Genitourinary: Positive for  frequency Musculoskeletal: Negative for joint pain or swelling Skin: Negative for rash. Neurological: Negative for focal weakness Psychiatric: no anxiety    ____________________________________________   PHYSICAL EXAM:  VITAL SIGNS: ED Triage Vitals  Enc Vitals Group     BP 07/27/15 1133 133/82 mmHg     Pulse Rate 07/27/15 1133 95     Resp 07/27/15 1133 20     Temp 07/27/15 1133 98.2 F (36.8 C)     Temp Source 07/27/15 1133 Oral     SpO2 07/27/15 1133 100 %     Weight 07/27/15 1133 150 lb (68.04 kg)     Height 07/27/15 1133 5' 8"  (1.727 m)     Head Cir --      Peak Flow --      Pain Score 07/27/15 1134 3     Pain Loc --      Pain Edu? --      Excl. in [place]? --      Constitutional: Alert and oriented. Well appearing and in no distress.  Eyes: Conjunctivae are normal. No erythema or injection ENT   Head: Normocephalic and atraumatic.   Mouth/Throat: Mucous membranes are dry Cardiovascular: Normal rate, regular rhythm. Normal and symmetric distal pulses are present in the upper extremities.  Respiratory: Normal respiratory effort without tachypnea nor retractions.  Gastrointestinal: Soft and non-tender in all quadrants. No distention. There is no CVA tenderness. Peritoneal dialysis catheter noted Genitourinary: deferred Musculoskeletal: Nontender with normal range of motion in all extremities. No lower extremity tenderness nor edema. Neurologic:  Normal speech and language. No gross focal neurologic deficits are appreciated. Skin:  Skin is warm, dry and intact. No rash noted. Psychiatric: Mood and affect are normal. Patient exhibits appropriate insight and judgment.  ____________________________________________    LABS (pertinent positives/negatives)  Labs Reviewed  COMPREHENSIVE METABOLIC PANEL - Abnormal; Notable for the following:    Sodium 114 (*)    Chloride 79 (*)    CO2 18 (*)    Glucose, Bld 1118 (*)    BUN 66 (*)    Creatinine, Ser 6.49 (*)     Calcium 7.8 (*)    Total Protein 5.6 (*)    Albumin 2.9 (*)    AST 13 (*)    Alkaline Phosphatase 127 (*)    Total Bilirubin 1.8 (*)    GFR calc non Af Amer 10 (*)    GFR calc Af Amer 11 (*)    Anion gap 17 (*)    All other components  within normal limits  CBC - Abnormal; Notable for the following:    RBC 2.63 (*)    Hemoglobin 8.2 (*)    HCT 25.4 (*)    RDW 16.3 (*)    All other components within normal limits  LIPASE, BLOOD  URINALYSIS COMPLETEWITH MICROSCOPIC ([facility] ONLY)  BLOOD GAS, VENOUS    ____________________________________________   EKG  None  ____________________________________________    RADIOLOGY  None  ____________________________________________   PROCEDURES  Procedure(s) performed: none  Critical Care performed: yes  CRITICAL CARE Performed by: Lavonia Drafts   Total critical care time: 30 minutes  Critical care time was exclusive of separately billable procedures and treating other patients.  Critical care was necessary to treat or prevent imminent or life-threatening deterioration.  Critical care was time spent personally by me on the following activities: development of treatment plan with patient and/or surrogate as well as nursing, discussions with consultants, evaluation of patient's response to treatment, examination of patient, obtaining history from patient or surrogate, ordering and performing treatments and interventions, ordering and review of laboratory studies, ordering and review of radiographic studies, pulse oximetry and re-evaluation of patient's condition.   ____________________________________________   INITIAL IMPRESSION / ASSESSMENT AND PLAN / ED COURSE  Pertinent labs & imaging results that were available during my care of the patient were reviewed by me and considered in my medical decision making (see chart for details).  Patient presents with diffuse weakness. He has a history of peritoneal dialysis and  diabetes. We will check labs, and reevaluate   Glucose is over 1000, kidney function has worsened as well, anion gap is elevated. We will check a VBG and start IV fluids. Patient will require admission  ____________________________________________   FINAL CLINICAL IMPRESSION(S) / ED DIAGNOSES  Final diagnoses:  Diabetic ketoacidosis without coma associated with type 1 diabetes mellitus ([place])          Lavonia Drafts, MD 07/27/15 [phone]

## 2015-07-27 NOTE — H&P (Signed)
[place] [facility] [place] NAME: Gabriel Huff    MR#:  [id number]  DATE OF BIRTH:  [birth year]  DATE OF ADMISSION:  07/27/2015  PRIMARY CARE PHYSICIAN: Kathrine Haddock, NP   REQUESTING/REFERRING PHYSICIAN: Dr Corky Downs  CHIEF COMPLAINT:   Hyperglycemia HISTORY OF PRESENT ILLNESS:  Gabriel Huff  is a 40 y.o. male with a known history of Insulin-requiring diabetes who presents with weakness and nausea and vomiting for the past 4 days. Patient has not been taking his insulin for several days due to above symptoms. He presents today with weakness and lightheadedness.  PAST MEDICAL HISTORY:   Past Medical History  Diagnosis Date  . Cataract     bilateral  . Diabetes mellitus without complication ([place])   . Hypertension   . Chronic kidney disease     PAST SURGICAL HISTORY:   Past Surgical History  Procedure Laterality Date  . Cyst removal trunk    . Cataract extraction w/ intraocular lens implant Bilateral 2015  . Gynecomastia excision  1993  . Capd insertion N/A 12/06/2014    Procedure: LAPAROSCOPIC INSERTION CONTINUOUS AMBULATORY PERITONEAL DIALYSIS  (CAPD) CATHETER;  Surgeon: Katha Cabal, MD;  Location: [facility] ORS;  Service: Vascular;  Laterality: N/A;    SOCIAL HISTORY:   Social History  Substance Use Topics  . Smoking status: Never Smoker   . Smokeless tobacco: Never Used  . Alcohol Use: No    FAMILY HISTORY:   Family History  Problem Relation Age of Onset  . Diabetes Mother     DRUG ALLERGIES:   Allergies  Allergen Reactions  . Losartan Swelling    Feet and ankles swell     REVIEW OF SYSTEMS:  CONSTITUTIONAL: No fever, fatigue ++generalized weakness.  EYES: No blurred or double vision.  EARS, NOSE, AND THROAT: No tinnitus or ear pain.  RESPIRATORY: No cough, shortness of breath, wheezing or hemoptysis.  CARDIOVASCULAR: No chest pain, orthopnea, edema.  GASTROINTESTINAL: ++ nausea, vomiting, diarrhea NO  abdominal pain.  GENITOURINARY: No dysuria, hematuria.  ENDOCRINE: No polyuria, nocturia,  HEMATOLOGY: No anemia, easy bruising or bleeding SKIN: No rash or lesion. MUSCULOSKELETAL: No joint pain or arthritis.   NEUROLOGIC: No tingling, numbness, weakness.  PSYCHIATRY: No anxiety or depression.   MEDICATIONS AT HOME:   Prior to Admission medications   Medication Sig Start Date End Date Taking? Authorizing Provider  amLODipine (NORVASC) 10 MG tablet Take 10 mg by mouth every morning.    Historical Provider, MD  Blood Glucose Monitoring Suppl (ACCU-CHEK AVIVA PLUS) W/DEVICE KIT 1 strip by Does not apply route 4 (four) times daily. 12/11/14   Kathrine Haddock, NP  carvedilol (COREG) 12.5 MG tablet Take 12.5 mg by mouth 2 (two) times daily with a meal.    Historical Provider, MD  furosemide (LASIX) 40 MG tablet Take 40 mg by mouth every morning. 02/27/15   Historical Provider, MD  glucose blood test strip 1 each by Other route as needed for other. Tests 4 times/day.  E10.9 12/05/14   Kathrine Haddock, NP  ibuprofen (ADVIL,MOTRIN) 800 MG tablet Take 1 tablet (800 mg total) by mouth every 8 (eight) hours as needed. 03/30/15   Earleen Newport, MD  insulin aspart (NOVOLOG) 100 UNIT/ML injection Inject into the skin as needed for high blood sugar. Sliding scale insulin 0-150 no insulin 151-200 2 units 201-250 4 units 251-300 6 units 301-650 8 units 351-400 10 unit    Historical Provider, MD  insulin NPH  Human (HUMULIN N,NOVOLIN N) 100 UNIT/ML injection Inject 16 Units into the skin 2 (two) times daily before a meal.     Historical Provider, MD  losartan (COZAAR) 50 MG tablet Take 50 mg by mouth daily.    Historical Provider, MD  Multiple Vitamin (MULTIVITAMIN) tablet Take 1 tablet by mouth daily.    Historical Provider, MD  oxyCODONE-acetaminophen (PERCOCET) 5-325 MG tablet Take 2 tablets by mouth every 6 (six) hours as needed for moderate pain or severe pain. 03/30/15   Earleen Newport, MD   oxyCODONE-acetaminophen (PERCOCET/ROXICET) 5-325 MG per tablet Take 1 tablet by mouth every 8 (eight) hours as needed. 12/06/14   Historical Provider, MD      VITAL SIGNS:  Blood pressure 149/86, pulse 94, temperature 98.2 F (36.8 C), temperature source Oral, resp. rate 11, height 5' 8"  (1.727 m), weight 68.04 kg (150 lb), SpO2 100 %.  PHYSICAL EXAMINATION:  GENERAL:  40 y.o.-year-old patient lying in the bed with no acute distress. weak EYES: Pupils equal, round, reactive to light and accommodation. No scleral icterus. Extraocular muscles intact.  HEENT: Head atraumatic, normocephalic. Oropharynx and nasopharynx clear.  NECK:  Supple, no jugular venous distention. No thyroid enlargement, no tenderness.  LUNGS: Normal breath sounds bilaterally, no wheezing, rales,rhonchi or crepitation. No use of accessory muscles of respiration.  CARDIOVASCULAR: S1, S2 normal. No murmurs, rubs, or gallops.  ABDOMEN: Soft, nontender, nondistended. Bowel sounds present. No organomegaly or mass.  EXTREMITIES: No pedal edema, cyanosis, or clubbing.  NEUROLOGIC: Cranial nerves II through XII are grossly intact. No focal deficits. PSYCHIATRIC: The patient is alert and oriented x 3.  SKIN: No obvious rash, lesion, or ulcer.   LABORATORY PANEL:   CBC  Recent Labs Lab 07/27/15 1143  WBC 8.0  HGB 8.2*  HCT 25.4*  PLT 227   ------------------------------------------------------------------------------------------------------------------  Chemistries   Recent Labs Lab 07/27/15 1143  NA 114*  K 4.4  CL 79*  CO2 18*  GLUCOSE 1118*  BUN 66*  CREATININE 6.49*  CALCIUM 7.8*  AST 13*  ALT 24  ALKPHOS 127*  BILITOT 1.8*   ------------------------------------------------------------------------------------------------------------------  Cardiac Enzymes No results for input(s): TROPONINI in the last 168  hours. ------------------------------------------------------------------------------------------------------------------  RADIOLOGY:  No results found.  EKG:   Orders placed or performed in visit on 07/07/14  . EKG 12-Lead    IMPRESSION AND PLAN:    40 year old male with a history of diabetes and peritoneal dialysis who presents with nausea vomiting for 4 days and found to have DKA.  1. DKA: Continue aggressive treatment with IV fluids and insulin drip. Endocrinology consultation for a.m. Follow BMP every 6 hours. Follow DKA protocol. Check hemoglobin A1c.  2. End-stage renal disease on peritoneal dialysis: Consult nephrology for peritoneal dialysis Creatinine is likely more elevated than usual due to dehydration from hyperglycemia. 3. Nausea, vomiting and diarrhea: Likely gastroenteritis.  4. Enlarged cervical lymph nodes: Check strep throat culture  5. Essential hypertension going continue Coreg and Norvasc and losartan.  All the records are reviewed and case discussed with ED provider. Management plans discussed with the patient and he is in agreement.  CODE STATUS: FULL  CRITICAL CARE TOTAL TIME TAKING CARE OF THIS PATIENT: 50 minutes.    Orenthal Debski M.D on 07/27/2015 at 3:13 PM  Between 7am to 6pm - Pager - [phone] After 6pm go to [email or web address] - password [email or web address] [facility]  Office  [phone]  CC: Primary care physician; Kathrine Haddock, NP

## 2015-07-27 NOTE — ED Notes (Signed)
Developed n/v/d since Tuesday  Has not been able to keep much down  Last time vomited was last pm  Also having some abd pain which is describes as burning

## 2015-07-27 NOTE — ED Notes (Signed)
Admitting MD in room at this time

## 2015-07-27 NOTE — Progress Notes (Signed)
[place] Progress Note Patient Name: Gabriel Huff. DOB: [birth year] MRN: [id number]   Date of Service  07/27/2015  HPI/Events of Note  New admission for DKA Stable on camera check  eICU Interventions  No eICU intervention     Intervention Category Evaluation Type: New Patient Evaluation  Simonne Maffucci 07/27/2015, 5:43 PM

## 2015-07-27 NOTE — Consult Note (Signed)
ENDOCRINOLOGY CONSULTATION  REFERRING PHYSICIAN:  Bettey Costa, MD CONSULTING PHYSICIAN:  A. Lavone Orn, MD.  CHIEF COMPLAINT:  Diabetic ketoacidosis  HISTORY OF PRESENT ILLNESS:  40 y.o. male with type 1 diabetes, end stage renal disease on peritoneal dialysis was admitted today with 4 days of N/V, diarrhea and inability to tolerate PO. Found to have initial BG 111, bicarb 18, AG 17 consistent with diabetic ketoacidosis. He has been started on IVF. IV insulin ordered, not yet delivered. He is conversive and able to provide a history. Explains onset of symptoms was on Tuesday and he had progressive weakness. Has not taken insulin or checked blood sugars in several days. Thought he didn't need insulin if he was not eating.   Diabetes is complicated by end stage renal disease. He has been doing dialysis since 01/2015.  He follows with Dr. Graceann Congress, [facility] for diabetes. Home regimen for diabetes is: Lantus 15 units each morning NovoLog 3 units tid AC + SSI (1 unit per sugar of 100 over a target of 200)  PAST MEDICAL HISTORY:  Past Medical History  Diagnosis Date  . Cataract     bilateral  . Type 1 diabetes ([place])   . Hypertension   . End stage renal disease ([place])     peritoneal dialysis since 01/2015     CURRENT MEDICATIONS:  . [START ON 07/28/2015] amLODipine  10 mg Oral [id number]  . carvedilol  12.5 mg Oral BID WC  . enoxaparin (LOVENOX) injection  40 mg Subcutaneous Q24H  . losartan  50 mg Oral Daily  . multivitamin with minerals  1 tablet Oral Daily  . potassium chloride  10 mEq Intravenous Q1H     SOCIAL HISTORY:  Social History  Substance Use Topics  . Smoking status: Never Smoker   . Smokeless tobacco: Never Used  . Alcohol Use: No     FAMILY HISTORY:   Family History  Problem Relation Age of Onset  . Diabetes Mother      ALLERGIES:  Allergies  Allergen Reactions  . Losartan Swelling and Other (See  Comments)    Reaction:  Feet/ankle swelling     REVIEW OF SYSTEMS:  GENERAL:  No weight loss.  No fever.  HEENT:  No blurred vision. No sore throat.  NECK:  No neck pain or dysphagia.  CARDIAC:  No chest pain or palpitation.  PULMONARY:  No cough or shortness of breath.  ABDOMEN:  No abdominal pain.  No constipation. EXTREMITIES:  No lower extremity swelling.  ENDOCRINE:  No heat or cold intolerance.  GENITOURINARY:  No dysuria or hematuria. SKIN:  No recent rash or skin changes.   PHYSICAL EXAMINATION:  BP 149/86 mmHg  Pulse 94  Temp(Src) 98.2 F (36.8 C) (Oral)  Resp 11  Ht 5\' 8"  (1.727 m)  Wt 68.04 kg (150 lb)  BMI 22.81 kg/m2  SpO2 100%  GENERAL:  Well-developed male in NAD. Appears ill. Tired.  HEENT:  EOMI.  Oropharynx is clear. Mucus membranes are dry. NECK:  Supple.  No thyromegaly.  No neck tenderness.  LYMPH: No anterior cervical or supraclavicular LAD.  CARDIAC:  Regular rate and rhythm without murmur.  PULMONARY:  Clear to auscultation bilaterally. No wheeze. ABDOMEN:  Mildly tender diffusely, soft, nondistended. +BS. EXTREMITIES:  No peripheral edema is present.    SKIN:  No rash or dermatopathy. A bilateral bare foot exam shows no calluses or ulcerations. NEUROLOGIC:  No dysarthria.  No tremor. PSYCHIATRIC:  Drowsy. Oriented, calm, cooperative.  LABORATORY DATA:  Results for orders placed or performed during the hospital encounter of 07/27/15 (from the past 24 hour(s))  Lipase, blood     Status: None   Collection Time: 07/27/15 11:43 AM  Result Value Ref Range   Lipase 25 11 - 51 U/L  Comprehensive metabolic panel     Status: Abnormal   Collection Time: 07/27/15 11:43 AM  Result Value Ref Range   Sodium 114 (LL) 135 - 145 mmol/L   Potassium 4.4 3.5 - 5.1 mmol/L   Chloride 79 (L) 101 - 111 mmol/L   CO2 18 (L) 22 - 32 mmol/L   Glucose, Bld 1118 (HH) 65 - 99 mg/dL   BUN 66 (H) 6 - 20 mg/dL   Creatinine, Ser 6.49 (H) 0.61 - 1.24 mg/dL   Calcium 7.8  (L) 8.9 - 10.3 mg/dL   Total Protein 5.6 (L) 6.5 - 8.1 g/dL   Albumin 2.9 (L) 3.5 - 5.0 g/dL   AST 13 (L) 15 - 41 U/L   ALT 24 17 - 63 U/L   Alkaline Phosphatase 127 (H) 38 - 126 U/L   Total Bilirubin 1.8 (H) 0.3 - 1.2 mg/dL   GFR calc non Af Amer 10 (L) >60 mL/min   GFR calc Af Amer 11 (L) >60 mL/min   Anion gap 17 (H) 5 - 15  CBC     Status: Abnormal   Collection Time: 07/27/15 11:43 AM  Result Value Ref Range   WBC 8.0 3.8 - 10.6 K/uL   RBC 2.63 (L) 4.40 - 5.90 MIL/uL   Hemoglobin 8.2 (L) 13.0 - 18.0 g/dL   HCT 25.4 (L) 40.0 - 52.0 %   MCV 96.5 80.0 - 100.0 fL   MCH 31.3 26.0 - 34.0 pg   MCHC 32.4 32.0 - 36.0 g/dL   RDW 16.3 (H) 11.5 - 14.5 %   Platelets 227 150 - 440 K/uL  Urinalysis complete, with microscopic ([facility] only)     Status: Abnormal   Collection Time: 07/27/15  2:00 PM  Result Value Ref Range   Color, Urine STRAW (A) YELLOW   APPearance CLEAR (A) CLEAR   Glucose, UA >500 (A) NEGATIVE mg/dL   Bilirubin Urine NEGATIVE NEGATIVE   Ketones, ur TRACE (A) NEGATIVE mg/dL   Specific Gravity, Urine 1.015 1.005 - 1.030   Hgb urine dipstick 1+ (A) NEGATIVE   pH 5.0 5.0 - 8.0   Protein, ur NEGATIVE NEGATIVE mg/dL   Nitrite NEGATIVE NEGATIVE   Leukocytes, UA NEGATIVE NEGATIVE   RBC / HPF NONE SEEN 0 - 5 RBC/hpf   WBC, UA 0-5 0 - 5 WBC/hpf   Bacteria, UA NONE SEEN NONE SEEN   Squamous Epithelial / LPF NONE SEEN NONE SEEN   Mucous PRESENT   Blood gas, venous     Status: Abnormal   Collection Time: 07/27/15  2:00 PM  Result Value Ref Range   FIO2 21.00    pH, Ven 7.25 (L) 7.320 - 7.430   pCO2, Ven 43 (L) 44.0 - 60.0 mmHg   pO2, Ven 51.0 (H) 31.0 - 45.0 mmHg   Bicarbonate 18.9 (L) 21.0 - 28.0 mEq/L   Acid-base deficit 7.8 (H) 0.0 - 2.0 mmol/L   O2 Saturation 78.9 %   Patient temperature 37.0    Collection site VEIN    Sample type VEIN   Rapid Influenza A&B Antigens ([facility] only)     Status: None   Collection Time: 07/27/15  3:32 PM  Result Value Ref Range  Influenza A ([place]) NEGATIVE NEGATIVE   Influenza B ([facility]) NEGATIVE NEGATIVE  Glucose, capillary     Status: Abnormal   Collection Time: 07/27/15  4:14 PM  Result Value Ref Range   Glucose-Capillary >600 (HH) 65 - 99 mg/dL  POCT rapid strep A [facility])     Status: None   Collection Time: 07/27/15  4:19 PM  Result Value Ref Range   Streptococcus, Group A Screen (Direct) NEGATIVE NEGATIVE   06/07/15 -- Hgb A1c 10.8%  ASSESSMENT:  1. Diabetic ketoacidosis, severe 2.   Uncontrolled type 1 diabetes  3.   Type 1 diabetes with diabetic nephropathy 4.   Type 1 diabetes with end stage renal disease on dialysis 5.   Probable gastroenteritis  PLAN: 1. Agree with admitting MD plan for agressive hydration and IV insulin per DKA protocol.  Transition IVF to D5NS after BG <250, as planned.  2. Keep NPO until transitioned to SQ insulin. Once giving patient a diet, be sure to order carb controlled and NO CONCENTRATED SWEETS, NO JUICE.  3. Once AG has closed and sugars stable per protocol, transition to SQ insulin as per out-pt dosing of: Lantus 18 units qAM NovoLog 3 units tid AC NovoLog correction - 1 unit per BG of 100 over a target of 200 qAC 4. Agree with need for Nephrology consult  Endocrinology service is unavailable over the upcoming weekend. Will see patient in follow up on Monday 07/30/15 if he remains in hospital.

## 2015-07-28 LAB — GLUCOSE, CAPILLARY
GLUCOSE-CAPILLARY: 430 mg/dL — AB (ref 65–99)
GLUCOSE-CAPILLARY: 76 mg/dL (ref 65–99)
GLUCOSE-CAPILLARY: 80 mg/dL (ref 65–99)
Glucose-Capillary: 301 mg/dL — ABNORMAL HIGH (ref 65–99)
Glucose-Capillary: 205 mg/dL — ABNORMAL HIGH (ref 65–99)
Glucose-Capillary: 143 mg/dL — ABNORMAL HIGH (ref 65–99)
Glucose-Capillary: 122 mg/dL — ABNORMAL HIGH (ref 65–99)
Glucose-Capillary: 95 mg/dL (ref 65–99)

## 2015-07-28 LAB — BASIC METABOLIC PANEL
ANION GAP: 8 (ref 5–15)
BUN: 64 mg/dL — ABNORMAL HIGH (ref 6–20)
CO2: 20 mmol/L — ABNORMAL LOW (ref 22–32)
Calcium: 7.7 mg/dL — ABNORMAL LOW (ref 8.9–10.3)
Chloride: 103 mmol/L (ref 101–111)
Creatinine, Ser: 6.62 mg/dL — ABNORMAL HIGH (ref 0.61–1.24)
GFR calc Af Amer: 11 mL/min — ABNORMAL LOW (ref >60)
GFR, EST NON AFRICAN AMERICAN: 9 mL/min — AB (ref >60)
Glucose, Bld: 118 mg/dL — ABNORMAL HIGH (ref 65–99)
POTASSIUM: 3.4 mmol/L — AB (ref 3.5–5.1)
SODIUM: 131 mmol/L — AB (ref 135–145)

## 2015-07-28 LAB — HEMOGLOBIN A1C: HEMOGLOBIN A1C: 10.5 % — AB (ref 4.0–6.0)

## 2015-07-28 MED ORDER — ENOXAPARIN SODIUM 30 MG/0.3ML ~~LOC~~ SOLN: 30.0000 mg | SUBCUTANEOUS | Status: DC

## 2015-07-28 MED ORDER — INSULIN GLARGINE 100 UNIT/ML ~~LOC~~ SOLN: 18.0000 [IU] | Freq: Every day | SUBCUTANEOUS | Status: DC

## 2015-07-28 MED ORDER — GUAIFENESIN ER 600 MG PO TB12
600.0000 mg | ORAL_TABLET | Freq: Two times a day (BID) | ORAL | Status: DC
  Administered 2015-07-28: 600 mg via ORAL
  Filled 2015-07-28: qty 1

## 2015-07-28 MED ORDER — INSULIN GLARGINE 100 UNIT/ML ~~LOC~~ SOLN
18.0000 [IU] | Freq: Every day | SUBCUTANEOUS | Status: DC
  Administered 2015-07-28: 18 [IU] via SUBCUTANEOUS
  Filled 2015-07-28: qty 0.18

## 2015-07-28 MED ORDER — INSULIN ASPART 100 UNIT/ML ~~LOC~~ SOLN: 3.0000 [IU] | Freq: Three times a day (TID) | SUBCUTANEOUS | Status: DC

## 2015-07-28 MED ORDER — GUAIFENESIN ER 600 MG PO TB12: 600.0000 mg | ORAL_TABLET | Freq: Two times a day (BID) | ORAL | Status: DC

## 2015-07-28 NOTE — Progress Notes (Signed)
[facility]  ROUNDING NOTE   Subjective:  Patient well known to [place] as we follow him for outpatient peritoneal dialysis. He reports that he began having nausea and vomiting on Tuesday of this past week. This persisted until his admission. Since he was not having any significant by mouth intake he was not taking insulin. He presented with diabetic ketoacidosis. He states that he was performing his peritoneal dialysis at home. No cloudy fluid at home. Blood glucose was 1118 when he first presented. CBC showed normal white count.  Objective:  Vital signs in last 24 hours:  Temp:  [98.2 F (36.8 C)-99.4 F (37.4 C)] 98.7 F (37.1 C) (03/18 0400) Pulse Rate:  [77-105] 78 (03/18 0600) Resp:  [10-20] 12 (03/18 0600) BP: (79-159)/(57-99) 111/67 mmHg (03/18 0600) SpO2:  [98 %-100 %] 99 % (03/18 0600) Weight:  [68.04 kg (150 lb)] 68.04 kg (150 lb) (03/17 1133)  Weight change:  Filed Weights   07/27/15 1133  Weight: 68.04 kg (150 lb)    Intake/Output: I/O last 3 completed shifts: In: 3723.5 [I.V.:3523.5; IV Piggyback:200] Out: 400 [Urine:400]   Intake/Output this shift:     Physical Exam: General: NAD, resting in bed  Head: Normocephalic, atraumatic. Moist oral mucosal membranes  Eyes: Anicteric, PERRL  Neck: Supple, trachea midline  Lungs:  Clear to auscultation normal effort  Heart: Regular rate and rhythm, no rubs  Abdomen:  Soft, nontender, BS present, PD catheter in place, no drainage noted  Extremities: no peripheral edema.  Neurologic: Nonfocal, moving all four extremities  Skin: No lesions  Access: PD catheter in place    Basic Metabolic Panel:  Recent Labs Lab 07/27/15 1143 07/27/15 1645 07/27/15 2202  NA 114* 117* 123*  K 4.4 4.6 3.6  CL 79* 85* 94*  CO2 18* 15* 20*  GLUCOSE 1118* 1015* 649*  BUN 66* 64* 63*  CREATININE 6.49* 6.61* 6.35*  CALCIUM 7.8* 7.7* 7.7*  MG  --   --  1.8  PHOS  --   --  4.5    Liver Function Tests:  Recent  Labs Lab 07/27/15 1143  AST 13*  ALT 24  ALKPHOS 127*  BILITOT 1.8*  PROT 5.6*  ALBUMIN 2.9*    Recent Labs Lab 07/27/15 1143  LIPASE 25   No results for input(s): AMMONIA in the last 168 hours.  CBC:  Recent Labs Lab 07/27/15 1143  WBC 8.0  HGB 8.2*  HCT 25.4*  MCV 96.5  PLT 227    Cardiac Enzymes: No results for input(s): CKTOTAL, CKMB, CKMBINDEX, TROPONINI in the last 168 hours.  BNP: Invalid input(s): POCBNP  CBG:  Recent Labs Lab 07/28/15 0304 07/28/15 0358 07/28/15 0506 07/28/15 0611 07/28/15 0702  GLUCAP 143* 122* 95 76 80    Microbiology: Results for orders placed or performed during the hospital encounter of 07/27/15  Rapid Influenza A&B Antigens ([facility] only)     Status: None   Collection Time: 07/27/15  3:32 PM  Result Value Ref Range Status   Influenza A ([facility]) NEGATIVE NEGATIVE Final   Influenza B ([facility]) NEGATIVE NEGATIVE Final  MRSA PCR Screening     Status: None   Collection Time: 07/27/15  4:26 PM  Result Value Ref Range Status   MRSA by PCR NEGATIVE NEGATIVE Final    Comment:        The GeneXpert MRSA Assay (FDA approved for NASAL specimens only), is one component of a comprehensive MRSA colonization surveillance program. It is not intended to diagnose  MRSA infection nor to guide or monitor treatment for MRSA infections.     Coagulation Studies: No results for input(s): LABPROT, INR in the last 72 hours.  Urinalysis:  Recent Labs  07/27/15 1400  COLORURINE STRAW*  LABSPEC 1.015  PHURINE 5.0  GLUCOSEU >500*  HGBUR 1+*  BILIRUBINUR NEGATIVE  KETONESUR TRACE*  PROTEINUR NEGATIVE  NITRITE NEGATIVE  LEUKOCYTESUR NEGATIVE      Imaging: No results found.   Medications:   . dextrose 5 % and 0.45% NaCl 50 mL/hr at 07/28/15 0600   . amLODipine  10 mg Oral BH-q7a  . carvedilol  12.5 mg Oral BID WC  . enoxaparin (LOVENOX) injection  30 mg Subcutaneous Q24H  . guaiFENesin  600 mg Oral BID  . insulin aspart  3  Units Subcutaneous TID WC  . insulin glargine  18 Units Subcutaneous Daily  . losartan  50 mg Oral Daily  . multivitamin with minerals  1 tablet Oral Daily   ondansetron (ZOFRAN) IV, oxyCODONE-acetaminophen  Assessment/ Plan:  40 y.o. male with diabetes mellitus type 1, diabetic retinopathy, hypertension, ESRD on PD, SHPTH, anemia of CKD.   1. End-stage renal disease on peritoneal dialysis. Patient seen and evaluated at bedside. He presented with diabetic ketoacidosis. He had nausea and vomiting at home and was not taking his insulin. He was however performing his peritoneal dialysis at home. He denies any cloudy fluid at home. His exit site is unremarkable. If he is not discharged today we will proceed with peritoneal dialysis.  2. Diabetic ketoacidosis. Patient has been appropriately treated with insulin drip and his blood sugars have significantly improved. Further management per hospitalist.  3. Anemia of chronic kidney disease. Hemoglobin noted as being 8.2. He will continue with Epogen as an outpatient.  4. Secondary hyperparathyroidism. Phosphorus currently 4.5 and acceptable. Continue to monitor bone mineral metabolism parameters.  5. Thanks for consultation.    LOS: 1 Karyl Sharrar 3/18/20177:33 AM

## 2015-07-28 NOTE — Progress Notes (Signed)
Patient ID: Gabriel Mannan Sr., male   DOB: 1975/12/02, 40 y.o.   MRN: [id number] [place] [facility] [place] NAME: Gabriel Huff    MR#:  [id number]  DATE OF BIRTH:  Dec 22, 1975  SUBJECTIVE:  Complains of sinus congestion. Feels hungry.  REVIEW OF SYSTEMS:   Review of Systems  Constitutional: Negative for fever, chills and weight loss.  HENT: Positive for congestion. Negative for ear discharge, ear pain and nosebleeds.   Eyes: Negative for blurred vision, pain and discharge.  Respiratory: Negative for sputum production, shortness of breath, wheezing and stridor.   Cardiovascular: Negative for chest pain, palpitations, orthopnea and PND.  Gastrointestinal: Negative for nausea, vomiting, abdominal pain and diarrhea.  Genitourinary: Negative for urgency and frequency.  Musculoskeletal: Negative for back pain and joint pain.  Neurological: Positive for weakness. Negative for sensory change, speech change and focal weakness.  Psychiatric/Behavioral: Negative for depression and hallucinations. The patient is not nervous/anxious.   All other systems reviewed and are negative.  Tolerating Diet: Yes Tolerating PT: Not needed  DRUG ALLERGIES:   Allergies  Allergen Reactions  . Losartan Swelling and Other (See Comments)    Reaction:  Feet/ankle swelling     VITALS:  Blood pressure 111/67, pulse 78, temperature 98.7 F (37.1 C), temperature source Oral, resp. rate 12, height 5\' 8"  (1.727 m), weight 68.04 kg (150 lb), SpO2 99 %.  PHYSICAL EXAMINATION:   Physical Exam  GENERAL:  40 y.o.-year-old patient lying in the bed with no acute distress.  EYES: Pupils equal, round, reactive to light and accommodation. No scleral icterus. Extraocular muscles intact.  HEENT: Head atraumatic, normocephalic. Oropharynx and nasopharynx clear.  NECK:  Supple, no jugular venous distention. No thyroid enlargement, no tenderness.  LUNGS: Normal breath sounds  bilaterally, no wheezing, rales, rhonchi. No use of accessory muscles of respiration.  CARDIOVASCULAR: S1, S2 normal. No murmurs, rubs, or gallops.  ABDOMEN: Soft, nontender, nondistended. Bowel sounds present. No organomegaly or mass.  EXTREMITIES: No cyanosis, clubbing or edema b/l.    NEUROLOGIC: Cranial nerves II through XII are intact. No focal Motor or sensory deficits b/l.   PSYCHIATRIC:  patient is alert and oriented x 3.  SKIN: No obvious rash, lesion, or ulcer.   LABORATORY PANEL:  CBC  Recent Labs Lab 07/27/15 1143  WBC 8.0  HGB 8.2*  HCT 25.4*  PLT 227    Chemistries   Recent Labs Lab 07/27/15 1143  07/27/15 2202  NA 114*  < > 123*  K 4.4  < > 3.6  CL 79*  < > 94*  CO2 18*  < > 20*  GLUCOSE 1118*  < > 649*  BUN 66*  < > 63*  CREATININE 6.49*  < > 6.35*  CALCIUM 7.8*  < > 7.7*  MG  --   --  1.8  AST 13*  --   --   ALT 24  --   --   ALKPHOS 127*  --   --   BILITOT 1.8*  --   --   < > = values in this interval not displayed.   40 year old male with a history of diabetes and peritoneal dialysis who presents with nausea vomiting for 4 days and found to have DKA.  1. DKA, severe -Received aggressive treatment with IV fluids and insulin drip---> sugars stable off insulin drip and IV fluids Endocrinology consultation appreciated. Started on Lantus and NovoLog per recommendation Sodium improving Follow DKA protocol.  hemoglobin A1c 10.1  2. End-stage renal disease on peritoneal dialysis: Consult nephrology for peritoneal dialysis Creatinine is likely more elevated than usual due to dehydration from hyperglycemia. Patient will resume peritoneal dialysis tonight. Spoke with Dr. Holley Raring  3. Nausea, vomiting and diarrhea: Likely gastroenteritis. -Resolved  4. Enlarged cervical lymph nodes: Check strep throat culture pending Group A strep screen negative  5. Essential hypertension Patient has related to hypotension  Hold Coreg and Norvasc and losartan for  now  Patient has been off IV fluids and IV insulin drip. We'll start him on regular carb controlled renal diet. If patient tolerates well blood pressure is better in labs are stable we'll consider discharging later today  Case discussed with Care Management/Social Worker. Management plans discussed with the patient, family and they are in agreement.  CODE STATUS: Full  DVT Prophylaxis: Heparin  TOTAL TIME TAKING CARE OF THIS PATIENT: 35* minutes.  >50% time spent on counselling and coordination of care  POSSIBLE D/C IN one DAYS, DEPENDING ON CLINICAL CONDITION.  Note: This dictation was prepared with Dragon dictation along with smaller phrase technology. Any transcriptional errors that result from this process are unintentional.  Gabriel Huff M.D on 07/28/2015 at 7:16 AM  Between 7am to 6pm - Pager - [phone]  After 6pm go to [email or web address] - password [email or web address] [facility]  Office  [phone]  CC: Primary care physician; Gabriel Haddock, NP

## 2015-07-28 NOTE — Discharge Summary (Signed)
[place] [facility] [place] NAME: Gabriel Huff    MR#:  [id number]  DATE OF BIRTH:  20-Jun-1975  DATE OF ADMISSION:  07/27/2015 ADMITTING PHYSICIAN: Gabriel Costa, MD  DATE OF DISCHARGE: 07/28/15  PRIMARY CARE PHYSICIAN: Gabriel Haddock, NP    ADMISSION DIAGNOSIS:  Diabetic ketoacidosis without coma associated with type 1 diabetes mellitus ([place]) [E10.10]  DISCHARGE DIAGNOSIS:  DKA and type 1 diabetes-resolved Hyponatremia secondary to uncontrolled sugars improved End-stage renal disease on peritoneal dialysis Sinus congestion  SECONDARY DIAGNOSIS:   Past Medical History  Diagnosis Date  . Cataract     bilateral  . Type 1 diabetes ([place])   . Hypertension   . End stage renal disease ([place])     peritoneal dialysis since 01/2015    HOSPITAL COURSE:   40 year old male with a history of diabetes and peritoneal dialysis who presents with nausea vomiting for 4 days and found to have DKA.  1. DKA, severe -Received aggressive treatment with IV fluids and insulin drip---> sugars stable off insulin drip and IV fluids Endocrinology consultation appreciated. Started on Lantus and NovoLog per recommendation Sodium improving hemoglobin A1c 10.1  2. End-stage renal disease on peritoneal dialysis: Consult nephrology for peritoneal dialysis Creatinine is likely more elevated than usual due to dehydration from hyperglycemia. Patient will resume peritoneal dialysis tonight. Spoke with Gabriel Huff  3. Nausea, vomiting and diarrhea: Likely gastroenteritis. -Resolved  4. Enlarged cervical lymph nodes: Check strep throat culture pending Group A strep screen negative  5. Essential hypertension Patient has related to hypotension Recheck patient's blood pressure meds. He only takes lisinopril which will be resume with holding parameters. Confirmed the patient does not take Coreg and amlodipine or losartan  Patient tolerated by mouth diet. We'll  discharge to home.  CONSULTS OBTAINED:  Treatment Team:  Gabriel Costa, MD Gabriel Legato, MD Gabriel Cong, MD  DRUG ALLERGIES:   Allergies  Allergen Reactions  . Losartan Swelling and Other (See Comments)    Reaction:  Feet/ankle swelling     DISCHARGE MEDICATIONS:   Current Discharge Medication List    START taking these medications   Details  guaiFENesin (MUCINEX) 600 MG 12 hr tablet Take 1 tablet (600 mg total) by mouth 2 (two) times daily. Qty: 14 tablet, Refills: 0      CONTINUE these medications which have CHANGED   Details  insulin glargine (LANTUS) 100 UNIT/ML injection Inject 0.18 mLs (18 Units total) into the skin daily. Qty: 10 mL, Refills: 11      CONTINUE these medications which have NOT CHANGED   Details  atorvastatin (LIPITOR) 20 MG tablet Take 20 mg by mouth at bedtime.    calcium acetate (PHOSLO) 667 MG capsule Take 667 mg by mouth 3 (three) times daily with meals.    docusate sodium (COLACE) 100 MG capsule Take 100-200 mg by mouth at bedtime as needed for mild constipation.    furosemide (LASIX) 40 MG tablet Take 40 mg by mouth daily.  Refills: 3    gentamicin cream (GARAMYCIN) 0.1 % Apply 1 application topically daily.    insulin aspart (NOVOLOG) 100 UNIT/ML injection Inject 3 Units into the skin 3 (three) times daily with meals.     lisinopril (PRINIVIL,ZESTRIL) 20 MG tablet Take 20 mg by mouth daily.    Multiple Vitamin (MULTIVITAMIN WITH MINERALS) TABS tablet Take 1 tablet by mouth daily.    polyethylene glycol (MIRALAX / GLYCOLAX) packet Take 17 g by mouth daily as  needed for mild constipation.      STOP taking these medications     amLODipine (NORVASC) 10 MG tablet      carvedilol (COREG) 12.5 MG tablet      losartan (COZAAR) 50 MG tablet      oxyCODONE-acetaminophen (PERCOCET) 5-325 MG tablet       If you experience worsening of your admission symptoms, develop shortness of breath, life threatening emergency, suicidal or  homicidal thoughts you must seek medical attention immediately by calling 911 or calling your MD immediately  if symptoms less severe.  You Must read complete instructions/literature along with all the possible adverse reactions/side effects for all the Medicines you take and that have been prescribed to you. Take any new Medicines after you have completely understood and accept all the possible adverse reactions/side effects.   Please note  You were cared for by a hospitalist during your hospital stay. If you have any questions about your discharge medications or the care you received while you were in the hospital after you are discharged, you can call the unit and asked to speak with the hospitalist on call if the hospitalist that took care of you is not available. Once you are discharged, your primary care physician will handle any further medical issues. Please note that NO REFILLS for any discharge medications will be authorized once you are discharged, as it is imperative that you return to your primary care physician (or establish a relationship with a primary care physician if you do not have one) for your aftercare needs so that they can reassess your need for medications and monitor your lab values.  DATA REVIEW:   CBC   Recent Labs Lab 07/27/15 1143  WBC 8.0  HGB 8.2*  HCT 25.4*  PLT 227    Chemistries   Recent Labs Lab 07/27/15 1143  07/27/15 2202 07/28/15 0741  NA 114*  < > 123* 131*  K 4.4  < > 3.6 3.4*  CL 79*  < > 94* 103  CO2 18*  < > 20* 20*  GLUCOSE 1118*  < > 649* 118*  BUN 66*  < > 63* 64*  CREATININE 6.49*  < > 6.35* 6.62*  CALCIUM 7.8*  < > 7.7* 7.7*  MG  --   --  1.8  --   AST 13*  --   --   --   ALT 24  --   --   --   ALKPHOS 127*  --   --   --   BILITOT 1.8*  --   --   --   < > = values in this interval not displayed.  Microbiology Results   Recent Results (from the past 240 hour(s))  Rapid Influenza A&B Antigens ([place] only)     Status: None    Collection Time: 07/27/15  3:32 PM  Result Value Ref Range Status   Influenza A ([place]) NEGATIVE NEGATIVE Final   Influenza B ([facility]) NEGATIVE NEGATIVE Final  Culture, group A strep     Status: None (Preliminary result)   Collection Time: 07/27/15  3:32 PM  Result Value Ref Range Status   Specimen Description THROAT  Final   Special Requests NONE  Final   Culture HOLDING FOR POSSIBLE PATHOGEN  Final   Report Status PENDING  Incomplete  MRSA PCR Screening     Status: None   Collection Time: 07/27/15  4:26 PM  Result Value Ref Range Status   MRSA by PCR NEGATIVE NEGATIVE  Final    Comment:        The GeneXpert MRSA Assay (FDA approved for NASAL specimens only), is one component of a comprehensive MRSA colonization surveillance program. It is not intended to diagnose MRSA infection nor to guide or monitor treatment for MRSA infections.     RADIOLOGY:  No results found.   Management plans discussed with the patient, family and they are in agreement.  CODE STATUS:     Code Status Orders        Start     Ordered   07/27/15 1619  Full code   Continuous     07/27/15 1618    Code Status History    Date Active Date Inactive Code Status Order ID Comments User Context   12/06/2014  2:57 PM 12/06/2014  8:01 PM Full Code [id number]  Katha Cabal, MD Inpatient      TOTAL TIME TAKING CARE OF THIS PATIENT: 40 minutes.    Pamila Mendibles M.D on 07/28/2015 at 10:03 AM  Between 7am to 6pm - Pager - [phone] After 6pm go to [email or web address] - password [email or web address] [facility]  Office  [phone]  CC: Primary care physician; Gabriel Haddock, NP

## 2015-07-28 NOTE — Progress Notes (Signed)
Discharge instructions reviewed and signed with patient. Pt belongings of cell phone and clothing collected by patient. 2 IVs removed with catheter intact. Pt wheeled out by orderly and escorted to private residence by mother.

## 2015-07-28 NOTE — Discharge Instructions (Signed)
Check sugars 3-4 times a day. Keep log for your primary care physician to follow. Follow up with her endocrinologist.

## 2015-07-29 LAB — CULTURE, GROUP A STREP (THRC)

## 2015-09-04 ENCOUNTER — Encounter: Payer: Self-pay | Admitting: *Deleted

## 2015-09-04 ENCOUNTER — Emergency Department
Admission: EM | Admit: 2015-09-04 | Discharge: 2015-09-05 | Disposition: A | Discharge status: Home / Self Care | Payer: Medicare Other | Attending: Emergency Medicine | Admitting: Emergency Medicine

## 2015-09-04 DIAGNOSIS — Z794 Long term (current) use of insulin: Secondary | ICD-10-CM | POA: Diagnosis not present

## 2015-09-04 DIAGNOSIS — Z992 Dependence on renal dialysis: Secondary | ICD-10-CM

## 2015-09-04 DIAGNOSIS — I120 Hypertensive chronic kidney disease with stage 5 chronic kidney disease or end stage renal disease: Secondary | ICD-10-CM | POA: Diagnosis not present

## 2015-09-04 DIAGNOSIS — E1022 Type 1 diabetes mellitus with diabetic chronic kidney disease: Secondary | ICD-10-CM | POA: Diagnosis not present

## 2015-09-04 DIAGNOSIS — R112 Nausea with vomiting, unspecified: Secondary | ICD-10-CM | POA: Insufficient documentation

## 2015-09-04 DIAGNOSIS — R197 Diarrhea, unspecified: Secondary | ICD-10-CM | POA: Diagnosis not present

## 2015-09-04 DIAGNOSIS — N186 End stage renal disease: Secondary | ICD-10-CM | POA: Insufficient documentation

## 2015-09-04 DIAGNOSIS — R109 Unspecified abdominal pain: Secondary | ICD-10-CM

## 2015-09-04 DIAGNOSIS — N19 Unspecified kidney failure: Secondary | ICD-10-CM

## 2015-09-04 LAB — URINALYSIS COMPLETE WITH MICROSCOPIC (ARMC ONLY)
BACTERIA UA: NONE SEEN
BILIRUBIN URINE: NEGATIVE
GLUCOSE, UA: NEGATIVE mg/dL
HGB URINE DIPSTICK: NEGATIVE
KETONES UR: NEGATIVE mg/dL
LEUKOCYTES UA: NEGATIVE
NITRITE: NEGATIVE
PH: 7 (ref 5.0–8.0)
Protein, ur: 100 mg/dL — AB
SPECIFIC GRAVITY, URINE: 1.009 (ref 1.005–1.030)
Squamous Epithelial / LPF: NONE SEEN

## 2015-09-04 LAB — COMPREHENSIVE METABOLIC PANEL
ALT: 26 U/L (ref 17–63)
ANION GAP: 8 (ref 5–15)
AST: 26 U/L (ref 15–41)
Albumin: 3.6 g/dL (ref 3.5–5.0)
Alkaline Phosphatase: 86 U/L (ref 38–126)
BILIRUBIN TOTAL: 0.6 mg/dL (ref 0.3–1.2)
BUN: 50 mg/dL — AB (ref 6–20)
CO2: 23 mmol/L (ref 22–32)
Calcium: 9.1 mg/dL (ref 8.9–10.3)
Chloride: 109 mmol/L (ref 101–111)
Creatinine, Ser: 5.98 mg/dL — ABNORMAL HIGH (ref 0.61–1.24)
GFR calc Af Amer: 12 mL/min — ABNORMAL LOW (ref >60)
GFR, EST NON AFRICAN AMERICAN: 11 mL/min — AB (ref >60)
Glucose, Bld: 76 mg/dL (ref 65–99)
POTASSIUM: 5 mmol/L (ref 3.5–5.1)
Sodium: 140 mmol/L (ref 135–145)
TOTAL PROTEIN: 6.3 g/dL — AB (ref 6.5–8.1)

## 2015-09-04 LAB — CBC
HEMATOCRIT: 36.8 % — AB (ref 40.0–52.0)
Hemoglobin: 12.3 g/dL — ABNORMAL LOW (ref 13.0–18.0)
MCH: 31.8 pg (ref 26.0–34.0)
MCHC: 33.3 g/dL (ref 32.0–36.0)
MCV: 95.6 fL (ref 80.0–100.0)
Platelets: 288 10*3/uL (ref 150–440)
RBC: 3.86 MIL/uL — ABNORMAL LOW (ref 4.40–5.90)
RDW: 15.7 % — AB (ref 11.5–14.5)
WBC: 9 10*3/uL (ref 3.8–10.6)

## 2015-09-04 LAB — GLUCOSE, CAPILLARY: Glucose-Capillary: 93 mg/dL (ref 65–99)

## 2015-09-04 LAB — LIPASE, BLOOD: Lipase: 19 U/L (ref 11–51)

## 2015-09-04 MED ORDER — ONDANSETRON HCL 4 MG/2ML IJ SOLN
INTRAMUSCULAR | Status: AC
  Administered 2015-09-04: 4 mg via INTRAVENOUS
  Filled 2015-09-04: qty 2

## 2015-09-04 MED ORDER — ONDANSETRON HCL 4 MG/2ML IJ SOLN
4.0000 mg | Freq: Once | INTRAMUSCULAR | Status: AC
  Administered 2015-09-04: 4 mg via INTRAVENOUS

## 2015-09-04 MED ORDER — DEXTROSE 5 % IV SOLN: 2.0000 g | INTRAVENOUS | Status: DC

## 2015-09-04 MED ORDER — MORPHINE SULFATE (PF) 4 MG/ML IV SOLN
4.0000 mg | Freq: Once | INTRAVENOUS | Status: AC
  Administered 2015-09-04: 4 mg via INTRAVENOUS

## 2015-09-04 MED ORDER — MORPHINE SULFATE (PF) 4 MG/ML IV SOLN
INTRAVENOUS | Status: AC
  Administered 2015-09-04: 4 mg via INTRAVENOUS
  Filled 2015-09-04: qty 1

## 2015-09-04 MED ORDER — VANCOMYCIN HCL IN DEXTROSE 1-5 GM/200ML-% IV SOLN
1000.0000 mg | Freq: Once | INTRAVENOUS | Status: AC
  Administered 2015-09-04: 1000 mg via INTRAVENOUS
  Filled 2015-09-04: qty 200

## 2015-09-04 MED ORDER — DEXTROSE 5 % IV SOLN
1.0000 g | INTRAVENOUS | Status: AC
  Administered 2015-09-04 – 2015-09-05 (×2): 1 g via INTRAVENOUS
  Filled 2015-09-04: qty 10

## 2015-09-04 MED ORDER — SODIUM CHLORIDE 0.9 % IV BOLUS (SEPSIS)
500.0000 mL | INTRAVENOUS | Status: AC
  Administered 2015-09-04: 500 mL via INTRAVENOUS

## 2015-09-04 NOTE — ED Notes (Addendum)
Pt to ED with emesis and diarrhea that began today. Pt denies abd pain at this time, stating "its just some cramping" Pt spoke to his "RN who prescribed him 2 pills of Cipro, took one this evening but unable to keep it down" Pt AAOx4, vitals stable. NAD noted. Pt is current peritoneal dialysis pt, does it himself every night.

## 2015-09-04 NOTE — ED Provider Notes (Addendum)
[facility] Emergency Department Provider Note  ____________________________________________  Time seen: Approximately 10:54 PM  I have reviewed the triage vital signs and the nursing notes.   HISTORY  Chief Complaint Diarrhea and Emesis    HPI Gabriel FIESER Sr. is a 40 y.o. male with a medical history that includes insulin-dependent diabetes and chronic renal failure with peritoneal dialysis for about one year.  Dr. Juleen China is his nephrologist.He presents due to gradual onset of nausea, vomiting, diarrhea, and abdominal pain that started this morning.  It started with some diarrhea and nausea and developed into vomiting and an intermittent mild to moderate abdominal cramping which has since resolved.  A few hours ago he called his dialysis nurse who told him to take a Cipro which she had at home already.  He vomited about 40 minutes after taking the Cipro, however.  The dialysis nurse expressed over the phone that she was concerned that he was suffering from peritonitis and suggested he go to the emergency department.  He does his own dialysis every night.  His symptoms have mostly resolved although he is still moderately nauseated.  He currently has no abdominal pain.  Nothing in specific medicine symptoms better and nothing made them worse.  He has never had peritonitis in the past although last year he became ill with a gastrointestinal infection (likely viral) and ended up in DKA.  He feels better now than he did when he first arrived.  He denies fever/chills, chest pain, shortness of breath.  He still produces urine and urinates multiple times a day and states that he has had no dysuria nor hematuria.    Past Medical History  Diagnosis Date  . Cataract     bilateral  . Type 1 diabetes ([place])   . Hypertension   . End stage renal disease ([place])     peritoneal dialysis since 01/2015    Patient Active Problem List   Diagnosis Date Noted  . DKA (diabetic  ketoacidoses) ([place]) 07/27/2015  . Poorly controlled type 1 diabetes mellitus ([place]) 12/05/2014  . Diabetes ([place]) 11/14/2014  . Renal failure 11/14/2014  . Type I diabetes mellitus, uncontrolled ([place]) 11/14/2014  . Hypertension associated with chronic kidney disease due to type 1 diabetes mellitus ([place]) 11/14/2014    Past Surgical History  Procedure Laterality Date  . Cyst removal trunk    . Cataract extraction w/ intraocular lens implant Bilateral 2015  . Gynecomastia excision  1993  . Capd insertion N/A 12/06/2014    Procedure: LAPAROSCOPIC INSERTION CONTINUOUS AMBULATORY PERITONEAL DIALYSIS  (CAPD) CATHETER;  Surgeon: Katha Cabal, MD;  Location: [facility] ORS;  Service: Vascular;  Laterality: N/A;    Current Outpatient Rx  Name  Route  Sig  Dispense  Refill  . atorvastatin (LIPITOR) 20 MG tablet   Oral   Take 20 mg by mouth at bedtime.         . calcium acetate (PHOSLO) 667 MG capsule   Oral   Take 667 mg by mouth 3 (three) times daily with meals.         . docusate sodium (COLACE) 100 MG capsule   Oral   Take 100-200 mg by mouth at bedtime as needed for mild constipation.         . furosemide (LASIX) 40 MG tablet   Oral   Take 40 mg by mouth daily.       3   . gentamicin cream (GARAMYCIN) 0.1 %   Topical  Apply 1 application topically daily.         [name] guaiFENesin (MUCINEX) 600 MG 12 hr tablet   Oral   Take 1 tablet (600 mg total) by mouth 2 (two) times daily.   14 tablet   0   . insulin aspart (NOVOLOG) 100 UNIT/ML injection   Subcutaneous   Inject 3 Units into the skin 3 (three) times daily with meals.          . insulin glargine (LANTUS) 100 UNIT/ML injection   Subcutaneous   Inject 0.18 mLs (18 Units total) into the skin daily.   10 mL   11   . lisinopril (PRINIVIL,ZESTRIL) 20 MG tablet   Oral   Take 20 mg by mouth daily.         . Multiple Vitamin (MULTIVITAMIN WITH MINERALS) TABS tablet   Oral   Take 1 tablet by mouth daily.          . ondansetron (ZOFRAN ODT) 4 MG disintegrating tablet      Allow 1-2 tablets to dissolve in your mouth every 8 hours as needed for nausea/vomiting   30 tablet   0   . polyethylene glycol (MIRALAX / GLYCOLAX) packet   Oral   Take 17 g by mouth daily as needed for mild constipation.           Allergies Losartan  Family History  Problem Relation Age of Onset  . Diabetes Mother     Social History Social History  Substance Use Topics  . Smoking status: Never Smoker   . Smokeless tobacco: Never Used  . Alcohol Use: No    Review of Systems Constitutional: No fever/chills Eyes: No visual changes. ENT: No sore throat. Cardiovascular: Denies chest pain. Respiratory: Denies shortness of breath. Gastrointestinal: +abd pain, N/V/D Genitourinary: Negative for dysuria. Musculoskeletal: Negative for back pain. Skin: Negative for rash. Neurological: Negative for headaches, focal weakness or numbness.  10-point ROS otherwise negative.  ____________________________________________   PHYSICAL EXAM:  VITAL SIGNS: ED Triage Vitals  Enc Vitals Group     BP 09/04/15 2022 128/87 mmHg     Pulse Rate 09/04/15 2022 114     Resp 09/04/15 2022 20     Temp 09/04/15 2022 99.6 F (37.6 C)     Temp Source 09/04/15 2022 Oral     SpO2 09/04/15 2022 98 %     Weight 09/04/15 2022 150 lb (68.04 kg)     Height 09/04/15 2022 5\' 8"  (1.727 m)     Head Cir --      Peak Flow --      Pain Score 09/04/15 2039 0     Pain Loc --      Pain Edu? --      Excl. in [place]? --     Constitutional: Alert and oriented. Well appearing and in no acute distress. Eyes: Conjunctivae are normal. PERRL. EOMI. Head: Atraumatic. Nose: No congestion/rhinnorhea. Mouth/Throat: Mucous membranes are moist.  Oropharynx non-erythematous. Neck: No stridor.  No meningeal signs.   Cardiovascular: Normal rate, regular rhythm. Good peripheral circulation. Grossly normal heart sounds.   Respiratory: Normal respiratory  effort.  No retractions. Lungs CTAB. Gastrointestinal: Soft and nontender. No distention.  Musculoskeletal: No lower extremity tenderness nor edema. No gross deformities of extremities. Neurologic:  Normal speech and language. No gross focal neurologic deficits are appreciated.  Skin:  Skin is warm, dry and intact. No rash noted. Psychiatric: Mood and affect are normal. Speech and behavior are normal.  ____________________________________________   LABS (all labs ordered are listed, but only abnormal results are displayed)  Labs Reviewed  COMPREHENSIVE METABOLIC PANEL - Abnormal; Notable for the following:    BUN 50 (*)    Creatinine, Ser 5.98 (*)    Total Protein 6.3 (*)    GFR calc non Af Amer 11 (*)    GFR calc Af Amer 12 (*)    All other components within normal limits  CBC - Abnormal; Notable for the following:    RBC 3.86 (*)    Hemoglobin 12.3 (*)    HCT 36.8 (*)    RDW 15.7 (*)    All other components within normal limits  URINALYSIS COMPLETEWITH MICROSCOPIC ([facility] ONLY) - Abnormal; Notable for the following:    Color, Urine STRAW (*)    APPearance CLEAR (*)    Protein, ur 100 (*)    All other components within normal limits  CULTURE, BLOOD (ROUTINE X 2)  CULTURE, BLOOD (ROUTINE X 2)  LIPASE, BLOOD  GLUCOSE, CAPILLARY   ____________________________________________  EKG  None ____________________________________________  RADIOLOGY   No results found.  ____________________________________________   PROCEDURES  Procedure(s) performed: None  Critical Care performed: No ____________________________________________   INITIAL IMPRESSION / ASSESSMENT AND PLAN / ED COURSE  Pertinent labs & imaging results that were available during my care of the patient were reviewed by me and considered in my medical decision making (see chart for details).  The patient received a dose of Cipro by mouth as per his dialysis nurse instructions, and I am uncertain how  this will affect the dialysate else.  I have placed orders to obtain PD samples and sent him to the lab.  The patient looks better and he has only mild borderline tachycardia at this time.  I will give him a small fluid bolus given the multiple episodes of vomiting and diarrhea.  Will also call and speak with the on-call nephrologist to discuss.  ----------------------------------------- 11:36 PM on 09/04/2015 -----------------------------------------  There are no emergency department nurses currently working who have been trained and obtaining PD samples, and the dialysis nurse is not present in the hospital.  I spoke with Dr. Candiss Norse by phone and explained the situation.  He and I agree that based on the clinical presentation and physical exam, the patient has a low risk of SBP.  The current plan per Dr. Candiss Norse used to provide empiric antibiotics since we cannot safely obtain dialysate samples without risking contamination.  If the patient is feeling better after a small fluid bolus and nausea medicine, he will be discharged to follow up in the morning with Dr. Juleen China.  If he is still unable to tolerate by mouth, has worsening abdominal pain, or worsening vital signs, we will admit him for empiric treatment and to see nephrology tomorrow.  I updated the patient and his mother and they both understand and agree with the plan.  ----------------------------------------- 1:46 AM on 09/05/2015 -----------------------------------------  Patient required another round of anti-medic (this time I used Haldol 1 mg IV and Benadryl 12.5 mg IV for refractory nausea).  At this time he feels much better and states that he very much wants to go home.  He has an appointment in the morning with his nephrologist as discussed above.  He still has borderline tachycardia of breath he gets appropriate for him to go home given his preference, is close follow-up plan, his normal lab work, and the empiric antibiotics  (ceftriaxone 2 g IV and vancomycin 1  g IV).  I gave my usual and customary return precautions.         ____________________________________________  FINAL CLINICAL IMPRESSION(S) / ED DIAGNOSES  Final diagnoses:  Non-intractable vomiting with nausea, vomiting of unspecified type  Diarrhea, unspecified type  Abdominal cramps  Renal failure  Type 1 diabetes mellitus with chronic kidney disease on chronic dialysis ([place])      NEW MEDICATIONS STARTED DURING THIS VISIT:  New Prescriptions   ONDANSETRON (ZOFRAN ODT) 4 MG DISINTEGRATING TABLET    Allow 1-2 tablets to dissolve in your mouth every 8 hours as needed for nausea/vomiting      Note:  This document was prepared using Dragon voice recognition software and may include unintentional dictation errors.   Hinda Kehr, MD 09/05/15 [id number]  Hinda Kehr, MD 09/05/15 [id number]

## 2015-09-04 NOTE — ED Notes (Signed)
Per MD orders for peritoneal fluid to be D/C due to no nurses in the ED trained to withdrawal fluids.

## 2015-09-05 DIAGNOSIS — R112 Nausea with vomiting, unspecified: Secondary | ICD-10-CM | POA: Diagnosis not present

## 2015-09-05 MED ORDER — HALOPERIDOL LACTATE 5 MG/ML IJ SOLN
1.0000 mg | Freq: Once | INTRAMUSCULAR | Status: AC
  Administered 2015-09-05: 1 mg via INTRAVENOUS

## 2015-09-05 MED ORDER — DIPHENHYDRAMINE HCL 50 MG/ML IJ SOLN
INTRAMUSCULAR | Status: AC
  Administered 2015-09-05: 12.5 mg via INTRAVENOUS
  Filled 2015-09-05: qty 1

## 2015-09-05 MED ORDER — HALOPERIDOL LACTATE 5 MG/ML IJ SOLN
INTRAMUSCULAR | Status: AC
  Administered 2015-09-05: 1 mg via INTRAVENOUS
  Filled 2015-09-05: qty 1

## 2015-09-05 MED ORDER — ONDANSETRON 4 MG PO TBDP: ORAL_TABLET | ORAL | Status: DC

## 2015-09-05 MED ORDER — DIPHENHYDRAMINE HCL 50 MG/ML IJ SOLN
12.5000 mg | Freq: Once | INTRAMUSCULAR | Status: AC
  Administered 2015-09-05: 12.5 mg via INTRAVENOUS

## 2015-09-05 NOTE — Discharge Instructions (Signed)
As we discussed, and as per our conversation with Dr. Candiss Norse, we gave you ceftriaxone 2 g IV and vancomycin 1 g IV tonight as well as a normal saline bolus of 500 mL.  We do not believe that you have spontaneous bacterial peritonitis at this time based on no abdominal pain or tenderness on exam.  You most likely are suffering from a viral gastroenteritis which should pass quickly.  Be sure to drink plenty of fluids and adjust her insulin appropriately if you have decreased food intake.  Follow up in the morning with Dr. Juleen China as scheduled and bring a sample of your dialysate with you to the office.  Return immediately to the emergency department if you develop any new or worsening symptoms.   Nausea and Vomiting Nausea is a sick feeling that often comes before throwing up (vomiting). Vomiting is a reflex where stomach contents come out of your mouth. Vomiting can cause severe loss of body fluids (dehydration). Children and elderly adults can become dehydrated quickly, especially if they also have diarrhea. Nausea and vomiting are symptoms of a condition or disease. It is important to find the cause of your symptoms. CAUSES   Direct irritation of the stomach lining. This irritation can result from increased acid production (gastroesophageal reflux disease), infection, food poisoning, taking certain medicines (such as nonsteroidal anti-inflammatory drugs), alcohol use, or tobacco use.  Signals from the brain.These signals could be caused by a headache, heat exposure, an inner ear disturbance, increased pressure in the brain from injury, infection, a tumor, or a concussion, pain, emotional stimulus, or metabolic problems.  An obstruction in the gastrointestinal tract (bowel obstruction).  Illnesses such as diabetes, hepatitis, gallbladder problems, appendicitis, kidney problems, cancer, sepsis, atypical symptoms of a heart attack, or eating disorders.  Medical treatments such as chemotherapy and  radiation.  Receiving medicine that makes you sleep (general anesthetic) during surgery. DIAGNOSIS Your caregiver may ask for tests to be done if the problems do not improve after a few days. Tests may also be done if symptoms are severe or if the reason for the nausea and vomiting is not clear. Tests may include:  Urine tests.  Blood tests.  Stool tests.  Cultures (to look for evidence of infection).  X-rays or other imaging studies. Test results can help your caregiver make decisions about treatment or the need for additional tests. TREATMENT You need to stay well hydrated. Drink frequently but in small amounts.You may wish to drink water, sports drinks, clear broth, or eat frozen ice pops or gelatin dessert to help stay hydrated.When you eat, eating slowly may help prevent nausea.There are also some antinausea medicines that may help prevent nausea. HOME CARE INSTRUCTIONS   Take all medicine as directed by your caregiver.  If you do not have an appetite, do not force yourself to eat. However, you must continue to drink fluids.  If you have an appetite, eat a normal diet unless your caregiver tells you differently.  Eat a variety of complex carbohydrates (rice, wheat, potatoes, bread), lean meats, yogurt, fruits, and vegetables.  Avoid high-fat foods because they are more difficult to digest.  Drink enough water and fluids to keep your urine clear or pale yellow.  If you are dehydrated, ask your caregiver for specific rehydration instructions. Signs of dehydration may include:  Severe thirst.  Dry lips and mouth.  Dizziness.  Dark urine.  Decreasing urine frequency and amount.  Confusion.  Rapid breathing or pulse. SEEK IMMEDIATE MEDICAL CARE IF:  You have blood or brown flecks (like coffee grounds) in your vomit.  You have black or bloody stools.  You have a severe headache or stiff neck.  You are confused.  You have severe abdominal pain.  You have  chest pain or trouble breathing.  You do not urinate at least once every 8 hours.  You develop cold or clammy skin.  You continue to vomit for longer than 24 to 48 hours.  You have a fever. MAKE SURE YOU:   Understand these instructions.  Will watch your condition.  Will get help right away if you are not doing well or get worse.   This information is not intended to replace advice given to you by your health care provider. Make sure you discuss any questions you have with your health care provider.   Document Released: 04/28/2005 Document Revised: 07/21/2011 Document Reviewed: 09/25/2010 Elsevier Interactive Patient Education 2016 [place].  Diarrhea Diarrhea is frequent loose and watery bowel movements. It can cause you to feel weak and dehydrated. Dehydration can cause you to become tired and thirsty, have a dry mouth, and have decreased urination that often is dark yellow. Diarrhea is a sign of another problem, most often an infection that will not last long. In most cases, diarrhea typically lasts 2-3 days. However, it can last longer if it is a sign of something more serious. It is important to treat your diarrhea as directed by your caregiver to lessen or prevent future episodes of diarrhea. CAUSES  Some common causes include:  Gastrointestinal infections caused by viruses, bacteria, or parasites.  Food poisoning or food allergies.  Certain medicines, such as antibiotics, chemotherapy, and laxatives.  Artificial sweeteners and fructose.  Digestive disorders. HOME CARE INSTRUCTIONS  Ensure adequate fluid intake (hydration): Have 1 cup (8 oz) of fluid for each diarrhea episode. Avoid fluids that contain simple sugars or sports drinks, fruit juices, whole milk products, and sodas. Your urine should be clear or pale yellow if you are drinking enough fluids. Hydrate with an oral rehydration solution that you can purchase at pharmacies, retail stores, and online. You can  prepare an oral rehydration solution at home by mixing the following ingredients together:   - tsp table salt.   tsp baking soda.   tsp salt substitute containing potassium chloride.  1  tablespoons sugar.  1 L (34 oz) of water.  Certain foods and beverages may increase the speed at which food moves through the gastrointestinal (GI) tract. These foods and beverages should be avoided and include:  Caffeinated and alcoholic beverages.  High-fiber foods, such as raw fruits and vegetables, nuts, seeds, and whole grain breads and cereals.  Foods and beverages sweetened with sugar alcohols, such as xylitol, sorbitol, and mannitol.  Some foods may be well tolerated and may help thicken stool including:  Starchy foods, such as rice, toast, pasta, low-sugar cereal, oatmeal, grits, baked potatoes, crackers, and bagels.  Bananas.  Applesauce.  Add probiotic-rich foods to help increase healthy bacteria in the GI tract, such as yogurt and fermented milk products.  Wash your hands well after each diarrhea episode.  Only take over-the-counter or prescription medicines as directed by your caregiver.  Take a warm bath to relieve any burning or pain from frequent diarrhea episodes. SEEK IMMEDIATE MEDICAL CARE IF:   You are unable to keep fluids down.  You have persistent vomiting.  You have blood in your stool, or your stools are black and tarry.  You do not urinate  in 6-8 hours, or there is only a small amount of very dark urine.  You have abdominal pain that increases or localizes.  You have weakness, dizziness, confusion, or light-headedness.  You have a severe headache.  Your diarrhea gets worse or does not get better.  You have a fever or persistent symptoms for more than 2-3 days.  You have a fever and your symptoms suddenly get worse. MAKE SURE YOU:   Understand these instructions.  Will watch your condition.  Will get help right away if you are not doing well or  get worse.   This information is not intended to replace advice given to you by your health care provider. Make sure you discuss any questions you have with your health care provider.   Document Released: 04/18/2002 Document Revised: 05/19/2014 Document Reviewed: 01/04/2012 Elsevier Interactive Patient Education 2016 [place] Choices to Help Relieve Diarrhea, Adult When you have diarrhea, the foods you eat and your eating habits are very important. Choosing the right foods and drinks can help relieve diarrhea. Also, because diarrhea can last up to 7 days, you need to replace lost fluids and electrolytes (such as sodium, potassium, and chloride) in order to help prevent dehydration.  WHAT GENERAL GUIDELINES DO I NEED TO FOLLOW?  Slowly drink 1 cup (8 oz) of fluid for each episode of diarrhea. If you are getting enough fluid, your urine will be clear or pale yellow.  Eat starchy foods. Some good choices include white rice, white toast, pasta, low-fiber cereal, baked potatoes (without the skin), saltine crackers, and bagels.  Avoid large servings of any cooked vegetables.  Limit fruit to two servings per day. A serving is  cup or 1 small piece.  Choose foods with less than 2 g of fiber per serving.  Limit fats to less than 8 tsp (38 g) per day.  Avoid fried foods.  Eat foods that have probiotics in them. Probiotics can be found in certain dairy products.  Avoid foods and beverages that may increase the speed at which food moves through the stomach and intestines (gastrointestinal tract). Things to avoid include:  High-fiber foods, such as dried fruit, raw fruits and vegetables, nuts, seeds, and whole grain foods.  Spicy foods and high-fat foods.  Foods and beverages sweetened with high-fructose corn syrup, honey, or sugar alcohols such as xylitol, sorbitol, and mannitol. WHAT FOODS ARE RECOMMENDED? Grains White rice. White, [place], or pita breads (fresh or toasted),  including plain rolls, buns, or bagels. White pasta. Saltine, soda, or graham crackers. Pretzels. Low-fiber cereal. Cooked cereals made with water (such as cornmeal, farina, or cream cereals). Plain muffins. Matzo. Melba toast. Zwieback.  Vegetables Potatoes (without the skin). Strained tomato and vegetable juices. Most well-cooked and canned vegetables without seeds. Tender lettuce. Fruits Cooked or canned applesauce, apricots, cherries, fruit cocktail, grapefruit, peaches, pears, or plums. Fresh bananas, apples without skin, cherries, grapes, cantaloupe, grapefruit, peaches, oranges, or plums.  Meat and Other Protein Products Baked or boiled chicken. Eggs. Tofu. Fish. Seafood. Smooth peanut butter. Ground or well-cooked tender beef, ham, veal, lamb, pork, or poultry.  Dairy Plain yogurt, kefir, and unsweetened liquid yogurt. Lactose-free milk, buttermilk, or soy milk. Plain hard cheese. Beverages Sport drinks. Clear broths. Diluted fruit juices (except prune). Regular, caffeine-free sodas such as ginger ale. Water. Decaffeinated teas. Oral rehydration solutions. Sugar-free beverages not sweetened with sugar alcohols. Other Bouillon, broth, or soups made from recommended foods.  The items listed above may not be a complete  list of recommended foods or beverages. Contact your dietitian for more options. WHAT FOODS ARE NOT RECOMMENDED? Grains Whole grain, whole wheat, bran, or rye breads, rolls, pastas, crackers, and cereals. Wild or brown rice. Cereals that contain more than 2 g of fiber per serving. Corn tortillas or taco shells. Cooked or dry oatmeal. Granola. Popcorn. Vegetables Raw vegetables. Cabbage, broccoli, Brussels sprouts, artichokes, baked beans, beet greens, corn, kale, legumes, peas, sweet potatoes, and yams. Potato skins. Cooked spinach and cabbage. Fruits Dried fruit, including raisins and dates. Raw fruits. Stewed or dried prunes. Fresh apples with skin, apricots, mangoes, pears,  raspberries, and strawberries.  Meat and Other Protein Products Chunky peanut butter. Nuts and seeds. Beans and lentils. Berniece Salines.  Dairy High-fat cheeses. Milk, chocolate milk, and beverages made with milk, such as milk shakes. Cream. Ice cream. Sweets and Desserts Sweet rolls, doughnuts, and sweet breads. Pancakes and waffles. Fats and Oils Butter. Cream sauces. Margarine. Salad oils. Plain salad dressings. Olives. Avocados.  Beverages Caffeinated beverages (such as coffee, tea, soda, or energy drinks). Alcoholic beverages. Fruit juices with pulp. Prune juice. Soft drinks sweetened with high-fructose corn syrup or sugar alcohols. Other Coconut. Hot sauce. Chili powder. Mayonnaise. Gravy. Cream-based or milk-based soups.  The items listed above may not be a complete list of foods and beverages to avoid. Contact your dietitian for more information. WHAT SHOULD I DO IF I BECOME DEHYDRATED? Diarrhea can sometimes lead to dehydration. Signs of dehydration include dark urine and dry mouth and skin. If you think you are dehydrated, you should rehydrate with an oral rehydration solution. These solutions can be purchased at pharmacies, retail stores, or online.  Drink -1 cup (120-240 mL) of oral rehydration solution each time you have an episode of diarrhea. If drinking this amount makes your diarrhea worse, try drinking smaller amounts more often. For example, drink 1-3 tsp (5-15 mL) every 5-10 minutes.  A general rule for staying hydrated is to drink 1-2 L of fluid per day. Talk to your health care provider about the specific amount you should be drinking each day. Drink enough fluids to keep your urine clear or pale yellow.   This information is not intended to replace advice given to you by your health care provider. Make sure you discuss any questions you have with your health care provider.   Document Released: 07/19/2003 Document Revised: 05/19/2014 Document Reviewed: 03/21/2013 Elsevier  Interactive Patient Education [employer].

## 2015-09-05 NOTE — ED Notes (Signed)
Pt given oral fluids, tolerated fluids without issue

## 2015-09-09 LAB — CULTURE, BLOOD (ROUTINE X 2): Culture: NO GROWTH

## 2016-03-18 DIAGNOSIS — Z940 Kidney transplant status: Secondary | ICD-10-CM

## 2016-03-18 HISTORY — DX: Kidney transplant status: Z94.0

## 2016-03-18 HISTORY — PX: COMBINED KIDNEY-PANCREAS TRANSPLANT: SHX1382

## 2016-03-18 HISTORY — PX: PANCREATECTOMY: SHX1019

## 2016-03-23 DIAGNOSIS — Z90410 Acquired total absence of pancreas: Secondary | ICD-10-CM | POA: Insufficient documentation

## 2016-04-25 DIAGNOSIS — D509 Iron deficiency anemia, unspecified: Secondary | ICD-10-CM | POA: Insufficient documentation

## 2016-04-25 DIAGNOSIS — R4589 Other symptoms and signs involving emotional state: Secondary | ICD-10-CM | POA: Insufficient documentation

## 2016-06-02 HISTORY — PX: CYSTOURETHROSCOPY: SHX476

## 2017-03-22 ENCOUNTER — Emergency Department: Payer: Medicare Other

## 2017-03-22 ENCOUNTER — Observation Stay
Admission: EM | Admit: 2017-03-22 | Discharge: 2017-03-23 | Disposition: A | Discharge status: Home / Self Care | Payer: Medicare Other | Attending: Internal Medicine | Admitting: Internal Medicine

## 2017-03-22 ENCOUNTER — Other Ambulatory Visit: Payer: Self-pay

## 2017-03-22 DIAGNOSIS — E870 Hyperosmolality and hypernatremia: Secondary | ICD-10-CM | POA: Diagnosis not present

## 2017-03-22 DIAGNOSIS — N179 Acute kidney failure, unspecified: Secondary | ICD-10-CM | POA: Insufficient documentation

## 2017-03-22 DIAGNOSIS — N186 End stage renal disease: Secondary | ICD-10-CM | POA: Insufficient documentation

## 2017-03-22 DIAGNOSIS — R739 Hyperglycemia, unspecified: Secondary | ICD-10-CM

## 2017-03-22 DIAGNOSIS — Z7982 Long term (current) use of aspirin: Secondary | ICD-10-CM | POA: Diagnosis not present

## 2017-03-22 DIAGNOSIS — E860 Dehydration: Secondary | ICD-10-CM | POA: Insufficient documentation

## 2017-03-22 DIAGNOSIS — Z9641 Presence of insulin pump (external) (internal): Secondary | ICD-10-CM | POA: Diagnosis not present

## 2017-03-22 DIAGNOSIS — Z794 Long term (current) use of insulin: Secondary | ICD-10-CM | POA: Diagnosis not present

## 2017-03-22 DIAGNOSIS — R079 Chest pain, unspecified: Secondary | ICD-10-CM | POA: Diagnosis not present

## 2017-03-22 DIAGNOSIS — Z992 Dependence on renal dialysis: Secondary | ICD-10-CM | POA: Insufficient documentation

## 2017-03-22 DIAGNOSIS — E1022 Type 1 diabetes mellitus with diabetic chronic kidney disease: Secondary | ICD-10-CM | POA: Diagnosis not present

## 2017-03-22 DIAGNOSIS — I120 Hypertensive chronic kidney disease with stage 5 chronic kidney disease or end stage renal disease: Secondary | ICD-10-CM | POA: Insufficient documentation

## 2017-03-22 DIAGNOSIS — Z79899 Other long term (current) drug therapy: Secondary | ICD-10-CM | POA: Insufficient documentation

## 2017-03-22 DIAGNOSIS — E1010 Type 1 diabetes mellitus with ketoacidosis without coma: Secondary | ICD-10-CM | POA: Diagnosis not present

## 2017-03-22 DIAGNOSIS — Z940 Kidney transplant status: Secondary | ICD-10-CM | POA: Diagnosis not present

## 2017-03-22 LAB — COMPREHENSIVE METABOLIC PANEL
ALBUMIN: 4.4 g/dL (ref 3.5–5.0)
ALK PHOS: 84 U/L (ref 38–126)
ALT: 39 U/L (ref 17–63)
ANION GAP: 19 — AB (ref 5–15)
AST: 33 U/L (ref 15–41)
BILIRUBIN TOTAL: 2.6 mg/dL — AB (ref 0.3–1.2)
BUN: 35 mg/dL — ABNORMAL HIGH (ref 6–20)
CALCIUM: 9.7 mg/dL (ref 8.9–10.3)
CO2: 14 mmol/L — ABNORMAL LOW (ref 22–32)
Chloride: 99 mmol/L — ABNORMAL LOW (ref 101–111)
Creatinine, Ser: 1.4 mg/dL — ABNORMAL HIGH (ref 0.61–1.24)
Glucose, Bld: 509 mg/dL (ref 65–99)
POTASSIUM: 5.1 mmol/L (ref 3.5–5.1)
Sodium: 132 mmol/L — ABNORMAL LOW (ref 135–145)
Total Protein: 7 g/dL (ref 6.5–8.1)

## 2017-03-22 LAB — URINALYSIS, COMPLETE (UACMP) WITH MICROSCOPIC
BACTERIA UA: NONE SEEN
Bilirubin Urine: NEGATIVE
Glucose, UA: >=500 mg/dL — AB
HGB URINE DIPSTICK: NEGATIVE
Ketones, ur: 80 mg/dL — AB
Leukocytes, UA: NEGATIVE
Nitrite: NEGATIVE
PROTEIN: NEGATIVE mg/dL
SPECIFIC GRAVITY, URINE: 1.021 (ref 1.005–1.030)
SQUAMOUS EPITHELIAL / LPF: NONE SEEN
pH: 6 (ref 5.0–8.0)

## 2017-03-22 LAB — CBC WITH DIFFERENTIAL/PLATELET
BASOS ABS: 0.1 10*3/uL (ref 0–0.1)
BASOS PCT: 0 %
EOS ABS: 0 10*3/uL (ref 0–0.7)
Eosinophils Relative: 0 %
HCT: 40.8 % (ref 40.0–52.0)
Hemoglobin: 13.2 g/dL (ref 13.0–18.0)
LYMPHS PCT: 6 %
Lymphs Abs: 0.8 10*3/uL — ABNORMAL LOW (ref 1.0–3.6)
MCH: 30.4 pg (ref 26.0–34.0)
MCHC: 32.3 g/dL (ref 32.0–36.0)
MCV: 94.2 fL (ref 80.0–100.0)
MONO ABS: 0.9 10*3/uL (ref 0.2–1.0)
Monocytes Relative: 7 %
Neutro Abs: 10.6 10*3/uL — ABNORMAL HIGH (ref 1.4–6.5)
Neutrophils Relative %: 87 %
PLATELETS: 259 10*3/uL (ref 150–440)
RBC: 4.34 MIL/uL — ABNORMAL LOW (ref 4.40–5.90)
RDW: 13.6 % (ref 11.5–14.5)
WBC: 12.3 10*3/uL — AB (ref 3.8–10.6)

## 2017-03-22 LAB — BLOOD GAS, VENOUS
PATIENT TEMPERATURE: 37
PCO2 VEN: 26 mmHg — AB (ref 44.0–60.0)
PH VEN: 7.35 (ref 7.250–7.430)
PO2 VEN: 43 mmHg (ref 32.0–45.0)

## 2017-03-22 LAB — TROPONIN I: TROPONIN I: 0.03 ng/mL — AB (ref <0.03)

## 2017-03-22 LAB — GLUCOSE, CAPILLARY
GLUCOSE-CAPILLARY: 474 mg/dL — AB (ref 65–99)
Glucose-Capillary: 426 mg/dL — ABNORMAL HIGH (ref 65–99)
Glucose-Capillary: 250 mg/dL — ABNORMAL HIGH (ref 65–99)
Glucose-Capillary: 407 mg/dL — ABNORMAL HIGH (ref 65–99)

## 2017-03-22 LAB — BRAIN NATRIURETIC PEPTIDE: B NATRIURETIC PEPTIDE 5: 27 pg/mL (ref 0.0–100.0)

## 2017-03-22 MED ORDER — INSULIN ASPART 100 UNIT/ML ~~LOC~~ SOLN
SUBCUTANEOUS | Status: AC
  Filled 2017-03-22: qty 1

## 2017-03-22 MED ORDER — ASPIRIN 81 MG PO CHEW: 324.0000 mg | CHEWABLE_TABLET | Freq: Once | ORAL | Status: DC

## 2017-03-22 MED ORDER — INSULIN ASPART 100 UNIT/ML ~~LOC~~ SOLN
5.0000 [IU] | Freq: Once | SUBCUTANEOUS | Status: AC
  Administered 2017-03-22: 5 [IU] via INTRAVENOUS
  Filled 2017-03-22: qty 1

## 2017-03-22 MED ORDER — ONDANSETRON HCL 4 MG/2ML IJ SOLN
4.0000 mg | Freq: Once | INTRAMUSCULAR | Status: AC
  Administered 2017-03-22: 4 mg via INTRAVENOUS
  Filled 2017-03-22: qty 2

## 2017-03-22 MED ORDER — ASPIRIN EC 81 MG PO TBEC
81.0000 mg | DELAYED_RELEASE_TABLET | Freq: Every day | ORAL | Status: DC
  Administered 2017-03-23: 81 mg via ORAL
  Filled 2017-03-22: qty 1

## 2017-03-22 MED ORDER — MYCOPHENOLATE SODIUM 180 MG PO TBEC
360.0000 mg | DELAYED_RELEASE_TABLET | Freq: Two times a day (BID) | ORAL | Status: DC
  Administered 2017-03-22 – 2017-03-23 (×2): 360 mg via ORAL
  Filled 2017-03-22 (×3): qty 2

## 2017-03-22 MED ORDER — NITROGLYCERIN 2 % TD OINT
0.5000 [in_us] | TOPICAL_OINTMENT | Freq: Once | TRANSDERMAL | Status: AC
  Administered 2017-03-22: 0.5 [in_us] via TOPICAL

## 2017-03-22 MED ORDER — ONDANSETRON 4 MG PO TBDP
4.0000 mg | ORAL_TABLET | Freq: Three times a day (TID) | ORAL | Status: DC | PRN
  Filled 2017-03-22: qty 1

## 2017-03-22 MED ORDER — ONDANSETRON HCL 4 MG/2ML IJ SOLN
4.0000 mg | Freq: Once | INTRAMUSCULAR | Status: AC
  Administered 2017-03-22: 4 mg via INTRAVENOUS

## 2017-03-22 MED ORDER — ACETAMINOPHEN 650 MG RE SUPP: 650.0000 mg | Freq: Four times a day (QID) | RECTAL | Status: DC | PRN

## 2017-03-22 MED ORDER — SODIUM CHLORIDE 0.9 % IV BOLUS (SEPSIS)
1000.0000 mL | Freq: Once | INTRAVENOUS | Status: AC
  Administered 2017-03-22: 1000 mL via INTRAVENOUS

## 2017-03-22 MED ORDER — ATORVASTATIN CALCIUM 20 MG PO TABS
20.0000 mg | ORAL_TABLET | Freq: Every day | ORAL | Status: DC
  Administered 2017-03-22: 20 mg via ORAL
  Filled 2017-03-22: qty 1

## 2017-03-22 MED ORDER — INSULIN GLARGINE 100 UNIT/ML ~~LOC~~ SOLN
18.0000 [IU] | Freq: Every day | SUBCUTANEOUS | Status: DC
  Administered 2017-03-22 – 2017-03-23 (×2): 18 [IU] via SUBCUTANEOUS
  Filled 2017-03-22 (×2): qty 0.18

## 2017-03-22 MED ORDER — NITROGLYCERIN 2 % TD OINT
TOPICAL_OINTMENT | TRANSDERMAL | Status: AC
  Filled 2017-03-22: qty 1

## 2017-03-22 MED ORDER — INSULIN ASPART 100 UNIT/ML ~~LOC~~ SOLN
25.0000 [IU] | Freq: Once | SUBCUTANEOUS | Status: AC
  Administered 2017-03-22: 25 [IU] via SUBCUTANEOUS
  Filled 2017-03-22: qty 1

## 2017-03-22 MED ORDER — INSULIN ASPART 100 UNIT/ML ~~LOC~~ SOLN: 0.0000 [IU] | Freq: Every day | SUBCUTANEOUS | Status: DC

## 2017-03-22 MED ORDER — INSULIN ASPART 100 UNIT/ML ~~LOC~~ SOLN
0.0000 [IU] | Freq: Three times a day (TID) | SUBCUTANEOUS | Status: DC
  Administered 2017-03-23 (×2): 3 [IU] via SUBCUTANEOUS
  Filled 2017-03-22 (×2): qty 1

## 2017-03-22 MED ORDER — ADULT MULTIVITAMIN W/MINERALS CH
1.0000 | ORAL_TABLET | Freq: Every day | ORAL | Status: DC
  Administered 2017-03-23: 1 via ORAL
  Filled 2017-03-22: qty 1

## 2017-03-22 MED ORDER — SODIUM CHLORIDE 0.9 % IV SOLN
INTRAVENOUS | Status: DC
  Administered 2017-03-22 – 2017-03-23 (×2): via INTRAVENOUS

## 2017-03-22 MED ORDER — PREDNISONE 10 MG PO TABS
5.0000 mg | ORAL_TABLET | Freq: Every day | ORAL | Status: DC
  Administered 2017-03-23: 5 mg via ORAL
  Filled 2017-03-22: qty 1

## 2017-03-22 MED ORDER — ONDANSETRON HCL 4 MG/2ML IJ SOLN
INTRAMUSCULAR | Status: AC
  Filled 2017-03-22: qty 2

## 2017-03-22 MED ORDER — ACETAMINOPHEN 325 MG PO TABS
650.0000 mg | ORAL_TABLET | Freq: Four times a day (QID) | ORAL | Status: DC | PRN
  Administered 2017-03-22: 650 mg via ORAL
  Filled 2017-03-22: qty 2

## 2017-03-22 MED ORDER — INSULIN ASPART 100 UNIT/ML ~~LOC~~ SOLN
10.0000 [IU] | Freq: Once | SUBCUTANEOUS | Status: AC
  Administered 2017-03-22: 10 [IU] via SUBCUTANEOUS

## 2017-03-22 MED ORDER — TACROLIMUS 1 MG PO CAPS
2.0000 mg | ORAL_CAPSULE | Freq: Two times a day (BID) | ORAL | Status: DC
  Administered 2017-03-22 – 2017-03-23 (×2): 2 mg via ORAL
  Filled 2017-03-22 (×3): qty 2

## 2017-03-22 MED ORDER — HEPARIN SODIUM (PORCINE) 5000 UNIT/ML IJ SOLN
5000.0000 [IU] | Freq: Three times a day (TID) | INTRAMUSCULAR | Status: DC
  Administered 2017-03-22 – 2017-03-23 (×3): 5000 [IU] via SUBCUTANEOUS
  Filled 2017-03-22 (×3): qty 1

## 2017-03-22 NOTE — ED Notes (Signed)
RT called that VBG has been sent to the lab

## 2017-03-22 NOTE — ED Notes (Signed)
Delay explained to pt and family - pt given diet sprite to drink

## 2017-03-22 NOTE — ED Notes (Signed)
CBG 474 - Dr Cinda Quest notified  Dr Cinda Quest also notified of lab report of [facility]G 509 See new orders

## 2017-03-22 NOTE — ED Notes (Addendum)
Attempted to call report again and Jasmine the charge nurse advised me that her nurse would call back for report "when she was ready that we arrived at [place] and she was busy" Modena Nunnery RN ED charge notified

## 2017-03-22 NOTE — ED Provider Notes (Signed)
[facility] Emergency Department Provider Note   ____________________________________________   First MD Initiated Contact with Patient 03/22/17 1341     (approximate)  I have reviewed the triage vital signs and the nursing notes.   HISTORY  Chief Complaint Chest Pain    HPI Gabriel Huff. is a 41 y.o. male Patient reports his insulin pump ran out of insulin a few days ago he's been using pen. He left his pain at his friend's house a few minutes away from his house last night and didn't get his insulin this morning. This morning he woke up at 8:00 and had a little bit of chest pressure. It got worse when he took a shower he laid down for a nap got up and started moving around and got worse again had a little bit shortness of breath with it and some tingling in his right shoulder as well. He called EMS and on his arrival here he was having nausea and vomiting as well. Chest pain was a 7 out of 10. He said it felt a little bit like when he was having problems with his kidney transplant. In the ER patient's having 5 out of 10 chest pain now little bit of shortness of breath some sweatiness still with nausea no further vomiting.   Past Medical History:  Diagnosis Date  . Cataract    bilateral  . End stage renal disease ([place])    peritoneal dialysis since 01/2015  . Hypertension   . Type 1 diabetes [facility])     Patient Active Problem List   Diagnosis Date Noted  . DKA (diabetic ketoacidoses) ([place]) 07/27/2015  . Poorly controlled type 1 diabetes mellitus ([place]) 12/05/2014  . Diabetes ([place]) 11/14/2014  . Renal failure 11/14/2014  . Type I diabetes mellitus, uncontrolled ([place]) 11/14/2014  . Hypertension associated with chronic kidney disease due to type 1 diabetes mellitus ([place]) 11/14/2014    Past Surgical History:  Procedure Laterality Date  . CATARACT EXTRACTION W/ INTRAOCULAR LENS IMPLANT Bilateral 2015  . CYST REMOVAL TRUNK    . GYNECOMASTIA EXCISION   1993  . KIDNEY TRANSPLANT      Prior to Admission medications   Medication Sig Start Date End Date Taking? Authorizing Provider  atorvastatin (LIPITOR) 20 MG tablet Take 20 mg by mouth at bedtime.    [provider]  calcium acetate (PHOSLO) 667 MG capsule Take 667 mg by mouth 3 (three) times daily with meals.    [provider]  docusate sodium (COLACE) 100 MG capsule Take 100-200 mg by mouth at bedtime as needed for mild constipation.    [provider]  furosemide (LASIX) 40 MG tablet Take 40 mg by mouth daily.     [provider]  gentamicin cream (GARAMYCIN) 0.1 % Apply 1 application topically daily.    [provider]  guaiFENesin (MUCINEX) 600 MG 12 hr tablet Take 1 tablet (600 mg total) by mouth 2 (two) times daily. 07/28/15   Fritzi Mandes, MD  insulin aspart (NOVOLOG) 100 UNIT/ML injection Inject 3 Units into the skin 3 (three) times daily with meals.     [provider]  insulin glargine (LANTUS) 100 UNIT/ML injection Inject 0.18 mLs (18 Units total) into the skin daily. 07/28/15   Fritzi Mandes, MD  lisinopril (PRINIVIL,ZESTRIL) 20 MG tablet Take 20 mg by mouth daily.    [provider]  Multiple Vitamin (MULTIVITAMIN WITH MINERALS) TABS tablet Take 1 tablet by mouth daily.  [provider]  ondansetron (ZOFRAN ODT) 4 MG disintegrating tablet Allow 1-2 tablets to dissolve in your mouth every 8 hours as needed for nausea/vomiting 09/05/15   Hinda Kehr, MD  polyethylene glycol (MIRALAX / Floria Raveling) packet Take 17 g by mouth daily as needed for mild constipation.    [provider]    Allergies Losartan  Family History  Problem Relation Age of Onset  . Diabetes Mother     Social History Social History   Tobacco Use  . Smoking status: Never Smoker  . Smokeless tobacco: Never Used  Substance Use Topics  . Alcohol use: No    Alcohol/week: 0.0 oz  . Drug use: No    Review of  Systems  Constitutional: No fever/chills Eyes: No visual changes. ENT: No sore throat. Cardiovascular:  chest pain. Respiratory: shortness of breath. Gastrointestinal: No abdominal pain.   nausea,  vomiting.  No diarrhea.  No constipation. Genitourinary: Negative for dysuria. Musculoskeletal: Negative for back pain. Skin: Negative for rash. Neurological: Negative for headaches, focal weakness  ____________________________________________   PHYSICAL EXAM:  VITAL SIGNS: ED Triage Vitals  Enc Vitals Group     BP 03/22/17 1357 109/66     Pulse Rate 03/22/17 1357 (!) 111     Resp 03/22/17 1357 14     Temp 03/22/17 1357 98 F (36.7 C)     Temp Source 03/22/17 1357 Oral     SpO2 03/22/17 1357 100 %     Weight 03/22/17 1358 150 lb (68 kg)     Height 03/22/17 1358 5\' 8"  (1.727 m)     Head Circumference --      Peak Flow --      Pain Score 03/22/17 1357 5     Pain Loc --      Pain Edu? --      Excl. in [place]? --     Constitutional: Alert and oriented. Ill appearing   Eyes: Conjunctivae are normal.  Head: Atraumatic. Nose: No congestion/rhinnorhea. Mouth/Throat: Mucous membranes are moist.  Oropharynx non-erythematous. Neck: No stridor.  Cardiovascular: Normal rate, regular rhythm. Grossly normal heart sounds.  Good peripheral circulation. Respiratory: Normal respiratory effort.  No retractions. Lungs CTAB. Gastrointestinal: Soft and nontender. No distention. No abdominal bruitslarge midline scar with mother scars as well. No CVA tenderness. Musculoskeletal: No lower extremity tenderness nor edema.  No joint effusions. Neurologic:  Normal speech and language. No gross focal neurologic deficits are appreciated.  Skin:  Skin is warm, dry and intact. No rash noted. Psychiatric: Mood and affect are normal. Speech and behavior are normal.  ____________________________________________   LABS (all labs ordered are listed, but only abnormal results are displayed)  Labs Reviewed   COMPREHENSIVE METABOLIC PANEL - Abnormal; Notable for the following components:      Result Value   Sodium 132 (*)    Chloride 99 (*)    CO2 14 (*)    Glucose, Bld 509 (*)    BUN 35 (*)    Creatinine, Ser 1.40 (*)    Total Bilirubin 2.6 (*)    Anion gap 19 (*)    All other components within normal limits  CBC WITH DIFFERENTIAL/PLATELET - Abnormal; Notable for the following components:   WBC 12.3 (*)    RBC 4.34 (*)    Neutro Abs 10.6 (*)    Lymphs Abs 0.8 (*)    All other components within normal limits  BLOOD GAS, VENOUS - Abnormal; Notable for the following components:   pCO2,  Ven 26 (*)    All other components within normal limits  GLUCOSE, CAPILLARY - Abnormal; Notable for the following components:   Glucose-Capillary 474 (*)    All other components within normal limits  GLUCOSE, CAPILLARY - Abnormal; Notable for the following components:   Glucose-Capillary 426 (*)    All other components within normal limits  BRAIN NATRIURETIC PEPTIDE  TROPONIN I  TROPONIN I  URINALYSIS, COMPLETE (UACMP) WITH MICROSCOPIC  CBG MONITORING, ED  CBG MONITORING, ED   ____________________________________________  EKG  EKG read and interpreted by me shows sinus tach at a rate of 117 normal axis diffuse high peaked T waves in the computer is reading ST segment elevation but I do not see it anywhere except for in aVF  EKG #2 shows resolution of the ST elevation in aVF ____________________________________________  RADIOLOGY chest x-ray read as no acute pathology  ____________________________________________   PROCEDURES  Procedure(s) performed:  Procedures  Critical Care performed:   ____________________________________________   INITIAL IMPRESSION / ASSESSMENT AND PLAN / ED COURSE  As part of my medical decision making, I reviewed the following data within the electronic MEDICAL RECORD NUMBER patient has a renal transplant on the left allograft he's had pancreas problems in the  past with pancreas surgery and duodenal stump leak is a candidate for pancreas transplant   patient's sugar is still elevated at 5:00. His chest pain is now gone first EKG showed a little bit of ST elevation in aVF only subsequent EKGs were negative troponins negative however he is over the appropriate age and has diabetes he has risk factors for cardiac disease the pain was substernal worse with exertion and better with rest and a little anxious and sending him home because it could easily be new onset angina does not sound like there is any sort of dissection and nothing showed up on the chest x-ray same with pulmonary embolism. He has not tenderness chest wall at all   ____________________________________________   FINAL CLINICAL IMPRESSION(S) / ED DIAGNOSES  Final diagnoses:  Chest pain, unspecified type  Hyperglycemia     ED Discharge Orders    None       Note:  This document was prepared using Dragon voice recognition software and may include unintentional dictation errors.    Nena Polio, MD 03/22/17 [phone]

## 2017-03-22 NOTE — ED Triage Notes (Signed)
Pt arrived via ems for c/o chest pain and nausea that started this am - pt is diaphoretic and skin is cool and clammy - chest pain is 10/10 - was given asa 324mg  by ems

## 2017-03-22 NOTE — ED Notes (Signed)
Pt reports that chest pain has returned with nausea - chest pain is in the center of his chest with no radiation - pain is a squeezing/pressure sensation

## 2017-03-22 NOTE — H&P (Signed)
Gabriel Montini Cavalieri Sr. is an 41 y.o. male.   Chief Complaint: Chest pain HPI: This is a 41 year old male who has a history of both kidney and pancreas transplants. His was done last year. His kidneys been functioning however his pancreas did not. He's currently undergoing evaluation for second pancreas transplant. He has type 1 diabetes. He ran out of his insulin for his insulin pump a few days ago and has been using insulin pens. He has appointment with endocrinologist at [facility] on Wednesday. Today he started having chest pain this morning it was associated with shortness of breath. He said he got worse later on and then he started having tingling in his right shoulder. Currently pain-free.  Past Medical History:  Diagnosis Date  . Cataract    bilateral  . End stage renal disease ([place])    peritoneal dialysis since 01/2015  . Hypertension   . Type 1 diabetes [facility])     Past Surgical History:  Procedure Laterality Date  . CATARACT EXTRACTION W/ INTRAOCULAR LENS IMPLANT Bilateral 2015  . CYST REMOVAL TRUNK    . GYNECOMASTIA EXCISION  1993  . KIDNEY TRANSPLANT      Family History  Problem Relation Age of Onset  . Diabetes Mother    Social History:  reports that  has never smoked. he has never used smokeless tobacco. He reports that he does not drink alcohol or use drugs.  Allergies:  Allergies  Allergen Reactions  . Losartan Swelling and Other (See Comments)    Reaction:  Feet/ankle swelling      (Not in a hospital admission)  Results for orders placed or performed during the hospital encounter of 03/22/17 (from the past 48 hour(s))  Blood gas, venous     Status: Abnormal   Collection Time: 03/22/17  1:43 PM  Result Value Ref Range   pH, Ven 7.35 7.250 - 7.430   pCO2, Ven 26 (L) 44.0 - 60.0 mmHg   pO2, Ven 43.0 32.0 - 45.0 mmHg   Patient temperature 37.0    Collection site VENOUS    Sample type VENOUS   Comprehensive metabolic panel     Status: Abnormal   Collection Time: 03/22/17   1:48 PM  Result Value Ref Range   Sodium 132 (L) 135 - 145 mmol/L   Potassium 5.1 3.5 - 5.1 mmol/L   Chloride 99 (L) 101 - 111 mmol/L   CO2 14 (L) 22 - 32 mmol/L   Glucose, Bld 509 (HH) 65 - 99 mg/dL    Comment: CRITICAL RESULT CALLED TO, READ BACK BY AND VERIFIED WITH TERESA CLAPP RN AT 1430 03/22/17. MSS    BUN 35 (H) 6 - 20 mg/dL   Creatinine, Ser 1.40 (H) 0.61 - 1.24 mg/dL   Calcium 9.7 8.9 - 10.3 mg/dL   Total Protein 7.0 6.5 - 8.1 g/dL   Albumin 4.4 3.5 - 5.0 g/dL   AST 33 15 - 41 U/L   ALT 39 17 - 63 U/L   Alkaline Phosphatase 84 38 - 126 U/L   Total Bilirubin 2.6 (H) 0.3 - 1.2 mg/dL   GFR calc non Af Amer >60 >60 mL/min   GFR calc Af Amer >60 >60 mL/min    Comment: (NOTE) The eGFR has been calculated using the CKD EPI equation. This calculation has not been validated in all clinical situations. eGFR's persistently <60 mL/min signify possible Chronic Kidney Disease.    Anion gap 19 (H) 5 - 15  Brain natriuretic peptide  Status: None   Collection Time: 03/22/17  1:48 PM  Result Value Ref Range   B Natriuretic Peptide 27.0 0.0 - 100.0 pg/mL  Troponin I     Status: None   Collection Time: 03/22/17  1:48 PM  Result Value Ref Range   Troponin I <0.03 <0.03 ng/mL  CBC with Differential     Status: Abnormal   Collection Time: 03/22/17  1:48 PM  Result Value Ref Range   WBC 12.3 (H) 3.8 - 10.6 K/uL   RBC 4.34 (L) 4.40 - 5.90 MIL/uL   Hemoglobin 13.2 13.0 - 18.0 g/dL   HCT 40.8 40.0 - 52.0 %   MCV 94.2 80.0 - 100.0 fL   MCH 30.4 26.0 - 34.0 pg   MCHC 32.3 32.0 - 36.0 g/dL   RDW 13.6 11.5 - 14.5 %   Platelets 259 150 - 440 K/uL   Neutrophils Relative % 87 %   Neutro Abs 10.6 (H) 1.4 - 6.5 K/uL   Lymphocytes Relative 6 %   Lymphs Abs 0.8 (L) 1.0 - 3.6 K/uL   Monocytes Relative 7 %   Monocytes Absolute 0.9 0.2 - 1.0 K/uL   Eosinophils Relative 0 %   Eosinophils Absolute 0.0 0 - 0.7 K/uL   Basophils Relative 0 %   Basophils Absolute 0.1 0 - 0.1 K/uL   Urinalysis, Complete w Microscopic     Status: Abnormal   Collection Time: 03/22/17  1:48 PM  Result Value Ref Range   Color, Urine STRAW (A) YELLOW   APPearance CLEAR (A) CLEAR   Specific Gravity, Urine 1.021 1.005 - 1.030   pH 6.0 5.0 - 8.0   Glucose, UA >=500 (A) NEGATIVE mg/dL   Hgb urine dipstick NEGATIVE NEGATIVE   Bilirubin Urine NEGATIVE NEGATIVE   Ketones, ur 80 (A) NEGATIVE mg/dL   Protein, ur NEGATIVE NEGATIVE mg/dL   Nitrite NEGATIVE NEGATIVE   Leukocytes, UA NEGATIVE NEGATIVE   RBC / HPF 0-5 0 - 5 RBC/hpf   WBC, UA 0-5 0 - 5 WBC/hpf   Bacteria, UA NONE SEEN NONE SEEN   Squamous Epithelial / LPF NONE SEEN NONE SEEN  Glucose, capillary     Status: Abnormal   Collection Time: 03/22/17  2:59 PM  Result Value Ref Range   Glucose-Capillary 474 (H) 65 - 99 mg/dL  Troponin I     Status: None   Collection Time: 03/22/17  3:51 PM  Result Value Ref Range   Troponin I <0.03 <0.03 ng/mL  Glucose, capillary     Status: Abnormal   Collection Time: 03/22/17  3:55 PM  Result Value Ref Range   Glucose-Capillary 426 (H) 65 - 99 mg/dL   Dg Chest Portable 1 View  Result Date: 03/22/2017 CLINICAL DATA:  Chest pain.  End-stage renal disease EXAM: PORTABLE CHEST 1 VIEW COMPARISON:  November 30, 2014 FINDINGS: There is no edema or consolidation. The heart size and pulmonary vascularity are normal. No adenopathy. No pneumothorax. No bone lesions. IMPRESSION: No edema or consolidation. Electronically Signed   By: Lowella Grip III M.D.   On: 03/22/2017 14:05    Review of Systems  Constitutional: Negative for chills and fever.  HENT: Negative for hearing loss.   Eyes: Negative for blurred vision.  Respiratory: Negative for sputum production.   Cardiovascular: Positive for chest pain.  Gastrointestinal: Negative for nausea and vomiting.  Genitourinary: Negative for dysuria.  Musculoskeletal: Negative for myalgias.  Skin: Negative for rash.  Neurological: Negative for dizziness.   Psychiatric/Behavioral: Negative  for memory loss.    Blood pressure 109/66, pulse (!) 111, temperature 98 F (36.7 C), temperature source Oral, resp. rate 14, height 5' 8"  (1.727 m), weight 68 kg (150 lb), SpO2 100 %. Physical Exam  Constitutional: He is oriented to person, place, and time. He appears well-developed and well-nourished. No distress.  HENT:  Head: Normocephalic and atraumatic.  Mouth/Throat: Oropharynx is clear and moist.  Eyes: Pupils are equal, round, and reactive to light. No scleral icterus.  Neck: Neck supple. No JVD present. No thyromegaly present.  Cardiovascular: Normal rate and regular rhythm.  No murmur heard. Respiratory: Breath sounds normal. No respiratory distress. He has no wheezes. He exhibits no tenderness.  GI: Soft. Bowel sounds are normal. He exhibits no distension and no mass. There is no tenderness.  Musculoskeletal: He exhibits no edema or tenderness.  Lymphadenopathy:    He has no cervical adenopathy.  Neurological: He is alert and oriented to person, place, and time.  Skin: Skin is warm and dry.     Assessment/Plan 1. Chest pain. He does have risk factors for coronary artery disease. We'll place him on aspirin. Check cardiac enzymes. Would normally set him up for stress test in the morning if he ruled out. However with such a high blood sugar and some renal insufficiency in a transplanted kidney we'll want to get his blood sugars under control and getting hydrated before proceeding with this. We'll go ahead and get cardiology involved as consultant as if he rules out maybe L2 to stress test as an outpatient. He tells me that he was to be set up for a [facility] test at [facility] at the end of this month as part of the workup for putting him back on the pancreas transplant list. 2. Hyperosmolar nonketotic state. Blood sugars are extremely high. We'll go ahead and give him insulin and IV hydration to bring this down. As stated above he does have an appointment  on Wednesday with endocrinologist to restart his pump. 3. Acute renal failure. He does have a transplanted kidney. Not sure what her baseline creatinine has been post transplant. Creatinine is 1.4 today. I suspect this is higher than baseline since he is dehydrated from the high blood sugars. We'll hydrate him aggressive IV fluids. Recheck his creatinine in the morning. 4. Status post kidney transplant. We'll continue all of his antirejection medications. Consult nephrology if necessary.  5. Status post pancreas transplant. First transplant has failed. He is under consideration for another transplant.  Baxter Hire, MD 03/22/2017, 5:36 PM

## 2017-03-22 NOTE — Progress Notes (Signed)
Patient qhs blood sugar 405. MD Salary notified. Verbal order to give 25 unit of novolog once. Will place order and administer as ordered.   Iran Sizer M

## 2017-03-22 NOTE — ED Notes (Signed)
Attempted to call report and Alyse Low advised this nurse that the nurse was busy - I advised her that I now had to give rolling report and she advised me that it was my fault for calling at shift change and they would call me back for report - advised Alyse Low that I would have my charge contact her charge because it was suppose to be one call one report

## 2017-03-22 NOTE — ED Notes (Signed)
Attempted to call report again and was placed on hold for 7 minutes and had to disconnect - will attempt again

## 2017-03-22 NOTE — ED Notes (Signed)
Attempted to call report was advised that nurse would call me back

## 2017-03-23 LAB — BASIC METABOLIC PANEL
Anion gap: 7 (ref 5–15)
BUN: 30 mg/dL — AB (ref 6–20)
CO2: 22 mmol/L (ref 22–32)
CREATININE: 1.32 mg/dL — AB (ref 0.61–1.24)
Calcium: 8.5 mg/dL — ABNORMAL LOW (ref 8.9–10.3)
Chloride: 106 mmol/L (ref 101–111)
Glucose, Bld: 230 mg/dL — ABNORMAL HIGH (ref 65–99)
Potassium: 4.3 mmol/L (ref 3.5–5.1)
SODIUM: 135 mmol/L (ref 135–145)

## 2017-03-23 LAB — GLUCOSE, CAPILLARY
GLUCOSE-CAPILLARY: 158 mg/dL — AB (ref 65–99)
Glucose-Capillary: 174 mg/dL — ABNORMAL HIGH (ref 65–99)
Glucose-Capillary: 393 mg/dL — ABNORMAL HIGH (ref 65–99)

## 2017-03-23 LAB — TROPONIN I
TROPONIN I: 0.05 ng/mL — AB (ref <0.03)
Troponin I: 0.08 ng/mL (ref <0.03)

## 2017-03-23 MED ORDER — INSULIN ASPART 100 UNIT/ML ~~LOC~~ SOLN: SUBCUTANEOUS | 0 refills | Status: DC

## 2017-03-23 MED ORDER — INSULIN ASPART 100 UNIT/ML ~~LOC~~ SOLN: 50.0000 [IU] | Freq: Every day | SUBCUTANEOUS | Status: DC | PRN

## 2017-03-23 NOTE — Progress Notes (Addendum)
Inpatient Diabetes Program Recommendations  AACE/ADA: New Consensus Statement on Inpatient Glycemic Control (2015)  Target Ranges:  Prepandial:   less than 140 mg/dL      Peak postprandial:   less than 180 mg/dL (1-2 hours)      Critically ill patients:  140 - 180 mg/dL   Results for Gabriel Huff, Gabriel Huff. (MRN [id number]) as of 03/23/2017 08:03  Ref. Range 03/22/2017 14:59 03/22/2017 15:55 03/22/2017 21:06 03/22/2017 22:54 03/23/2017 00:23 03/23/2017 07:33  Glucose-Capillary Latest Ref Range: 65 - 99 mg/dL 474 (H) 426 (H)  Novolog 5 units 250 (H) 407 (H)  Novolog 25 units  Lantus 18 units @23 :21 393 (H) 174 (H)   Review of Glycemic Control  Diabetes history: DM1 (patient does not make any insulin and is very sensitive to insulin) Outpatient Diabetes medications: Novolog with insulin pump Current orders for Inpatient glycemic control: Lantus 18 units daily, Novolog 0-15 units TID with meals, Novolog 0-5 units QHS  Inpatient Diabetes Program Recommendations:  Correction (SSI): Please decrease Novolog correction scale to 0-9 units TID with meals and continue Novolog 0-5 units QHS. Meal-Coverage: Please consider ordering Novolog 4 units TID with meals for meal coverage if patient eats at least 50% of meals. HgbA1C: Last A1C 7.6% on 02/20/17 indicating an average glucose of 171 mg/dl.   NOTE: In reviewing chart, noted patient has Type 1 DM and is followed by Dr. Gabriel Carina (Endocrinologist) and was last seen by her on 12/15/16. Per Dr. Joycie Peek office note on 12/15/16 patient is using Medtronic 630G insulin pump with Novolog insulin for outpatient DM control. The following should be patient's insulin pump settings (per office note on 12/15/16):  Basal rates 12 am 0.75 units/hr 3 am 0.85 units/hr 8 am 0.80 units/hr 11 am 0.70 units/hr 3 pm 0.90 units/hr 9 pm 0.95 units/hr  Bolus settings: I:C ratios: 12 am 1:12, 12 pm 1:10, 5 pm 1:12 Sensitivity: 12 am 45, 11 am 40, 7 pm 45 Target:  100-120 Insulin on board 4 hrs  Per H&P on 03/22/17, patient ran out of insulin for his insulin pump about 3 days ago and he was using insulin pens. Will plan to see patient today.  Addendum 03/23/17@13 :00-Spoke with patient regarding DM control and outpatient regimen. Patient reports that he is followed by Dr. Gabriel Carina and that he has an appointment with her on 03/25/17. Patient is using Medtronic insulin pump as an outpatient but he does not have his insulin pump with him at the hospital. Patient reports that Dr. Posey Pronto is going to discharge him today and he plans to resume his insulin pump when he gets home. Informed patient that he was given Lantus 18 units at 23:21 on 03/22/17 and Lantus 18 units again at 9:26 am today. Therefore, patient has received a total of Lantus 36 units over the past 13 hours. Patient's insulin pump only provides him to 19.95 units per 24 hours for basal insulin. Explained to patient that he should not resume his insulin pump for basal insulin until tomorrow morning (03/24/17) since the Lantus will work for about 24 hours and if he were to resume his insulin pump when he gets home he will be at increased risk of hypoglycemia since the SQ basal insulin is still working. Encouraged patient to check glucose frequently over the next 24 hours for close glucose monitoring.  Informed patient that if he uses his insulin pump at home, only connect it to give correction and meal coverage if needed for today and then disconnect  it until he fully resumes it tomorrow morning. Per Dr. Posey Pronto discharge note, patient had refills at his pharmacy for Novolog but he was not aware of this.  Patient verbalized understanding of information discussed and reports that he has no questions at this time.  Thanks, Barnie Alderman, RN, MSN, CDE Diabetes Coordinator Inpatient Diabetes Program [phone] (Team Pager from 8am to 5pm)

## 2017-03-23 NOTE — Plan of Care (Signed)
Patient oriented to room, call bell, and staff numbers. Expectations flyer, white board, patient guide and safety contract reviewed and signed. Call bell/ telephone in reach. Plan of care reviewed. All questions were answered. No concerns at this time. Will continue to monitor.

## 2017-03-23 NOTE — Care Management Obs Status (Signed)
[place] NOTIFICATION   Patient Details  Name: Gabriel GRASSE Sr. MRN: [id number] Date of Birth: 03-05-1976   Medicare Observation Status Notification Given:  Yes signed in Epic and copy given   Katrina Stack, RN 03/23/2017, 10:16 AM

## 2017-03-23 NOTE — Progress Notes (Signed)
Troponin 0.05. Patient asymptomatic, pt sleeping comfortably in bed. MD Pyreddy made aware. No orders at this time. Will continue to monitor.   Gabriel Huff

## 2017-03-23 NOTE — Progress Notes (Signed)
Serenity, RN went over discharge instruction with the patient including medication and follow-up appointment. Discontinue peripheral IV and telemetry monitor. Will call volunteer for transport.

## 2017-03-23 NOTE — Progress Notes (Signed)
Dr. Ubaldo Glassing made aware of troponin .08

## 2017-03-23 NOTE — Progress Notes (Signed)
[facility], [place] 03/23/17  Subjective:  Patient known to our practice from outpatient follow-up from last year.  He underwent kidney pancreas transplant on November 8144 which was complicated by pancreatic leak and necrosis requiring pancreatectomy immediately after transplant.  He developed refractory ascites which improved later.  Other complications post transplant included BK viremia.  His transplant regimen includes Prograf, CellCept and prednisone.  Patient is type I diabetic and uses an insulin pump.  Lately, he ran out of his insulin.  He has been using NovoLog pen. He presented to the emergency room yesterday for chest pain, nausea, diaphoresis.  In the ER his blood glucose was noted to be 509.  This morning, he feels well  Objective:  Vital signs in last 24 hours:  Temp:  [98 F (36.7 C)-98.4 F (36.9 C)] 98.4 F (36.9 C) (11/12 0813) Pulse Rate:  [87-118] 88 (11/12 0813) Resp:  [9-18] 18 (11/12 0756) BP: (81-133)/(44-79) 103/63 (11/12 0813) SpO2:  [98 %-100 %] 99 % (11/12 0813) Weight:  [62.2 kg (137 lb 3.2 oz)-68 kg (150 lb)] 62.7 kg (138 lb 4.8 oz) (11/12 0354)  Weight change:  Filed Weights   03/22/17 1358 03/22/17 2058 03/23/17 0354  Weight: 68 kg (150 lb) 62.2 kg (137 lb 3.2 oz) 62.7 kg (138 lb 4.8 oz)    Intake/Output:    Intake/Output Summary (Last 24 hours) at 03/23/2017 1032 Last data filed at 03/23/2017 0956 Gross per 24 hour  Intake 2995 ml  Output 575 ml  Net 2420 ml     Physical Exam: General:  Sitting up in the bed no acute distress  HEENT  anicteric, moist oral mucous membranes  Neck  supple  Pulm/lungs  clear to auscultation  CVS/Heart  regular rhythm  Abdomen:   Soft, nontender  Extremities:  No edema  Neurologic:  Alert, oriented  Skin:  No acute rashes          Basic Metabolic Panel:  Recent Labs  Lab 03/22/17 1348 03/23/17 0345  NA 132* 135  K 5.1 4.3  CL 99* 106  CO2 14* 22  GLUCOSE 509* 230*   BUN 35* 30*  CREATININE 1.40* 1.32*  CALCIUM 9.7 8.5*     CBC: Recent Labs  Lab 03/22/17 1348  WBC 12.3*  NEUTROABS 10.6*  HGB 13.2  HCT 40.8  MCV 94.2  PLT 259     No results found for: HEPBSAG, HEPBSAB, HEPBIGM    Microbiology:  No results found for this or any previous visit (from the past 240 hour(s)).  Coagulation Studies: No results for input(s): LABPROT, INR in the last 72 hours.  Urinalysis: Recent Labs    03/22/17 1348  COLORURINE STRAW*  LABSPEC 1.021  PHURINE 6.0  GLUCOSEU >=500*  HGBUR NEGATIVE  BILIRUBINUR NEGATIVE  KETONESUR 80*  PROTEINUR NEGATIVE  NITRITE NEGATIVE  LEUKOCYTESUR NEGATIVE      Imaging: Dg Chest Portable 1 View  Result Date: 03/22/2017 CLINICAL DATA:  Chest pain.  End-stage renal disease EXAM: PORTABLE CHEST 1 VIEW COMPARISON:  November 30, 2014 FINDINGS: There is no edema or consolidation. The heart size and pulmonary vascularity are normal. No adenopathy. No pneumothorax. No bone lesions. IMPRESSION: No edema or consolidation. Electronically Signed   By: Lowella Grip III M.D.   On: 03/22/2017 14:05     Medications:   . sodium chloride 150 mL/hr at 03/23/17 0552   . aspirin EC  81 mg Oral Daily  . atorvastatin  20 mg Oral QHS  .  heparin  5,000 Units Subcutaneous Q8H  . insulin aspart  0-15 Units Subcutaneous TID WC  . insulin aspart  0-5 Units Subcutaneous QHS  . insulin glargine  18 Units Subcutaneous Daily  . multivitamin with minerals  1 tablet Oral Daily  . mycophenolate  360 mg Oral BID  . predniSONE  5 mg Oral Daily  . tacrolimus  2 mg Oral BID   acetaminophen **OR** acetaminophen, ondansetron  Assessment/ Plan:  41 y.o. male with type 1 diabetes, end-stage renal disease, peritoneal dialysis, status post kidney pancreas transplant in November 2017 with pancreas transplant failure requiring pancreatectomy post transplant  1.  Kidney transplant status and chronic kidney disease stage III His serum  creatinine is 1.3 which is at his baseline.  GFR is greater than 60 Patient is able to tolerate his transplant medications.  His home regimen as noted above in the med list Today he feels better.  Blood sugars are better controlled. He will continue to follow-up with his kidney transplant team.   LOS: 0 [facility] 11/12/201810:32 [place], [place]

## 2017-03-23 NOTE — Consult Note (Signed)
Gabriel Huff  CARDIOLOGY CONSULT NOTE  Patient ID: Gabriel Heidecker C[place] Sr. MRN: [id number] DOB/AGE: 05-15-1975 41 y.o.  Admit date: 03/22/2017 Referring Physician Dr. Posey Pronto Primary Physician Dr. Golden Pop Primary Cardiologist   Reason for Consultation Chest pain  HPI: Pt is a 41 yo male with history of type 1 dm, hypertension, history of renal and pancreatic trpt in 15/7262 complicated post op by pancreatic leak and necrosis requiring pancreatectomy. He is awaiting consideration regarding being listed back on the pancreatic trpt list. He has been doing fairly well with regard to his kidney trpt however has BK viremia.He is currently on an insulin pump and is treated with prograf, cellcept and prednisone. He was admitted with discomfort in his chest and has ruled out for an mi. He had run out of his pump insulin for several days and was using insulin pens. He developed right shoulder pain and sob on day of admission. He presented to the er where his ekg was unremarkable for ischemia. He has improved with improvement in his glycemic control and currently is pain free. He has been scheduled for a stress echo at [facility] later this month.   Review of Systems  Constitutional: Negative.   HENT: Negative.   Eyes: Negative.   Respiratory: Negative.   Cardiovascular: Positive for chest pain.  Gastrointestinal: Negative.   Genitourinary: Negative.   Musculoskeletal: Negative.   Skin: Negative.   Neurological: Negative.   Endo/Heme/Allergies: Negative.   Psychiatric/Behavioral: Negative.     Past Medical History:  Diagnosis Date  . Cataract    bilateral  . End stage renal disease ([place])    peritoneal dialysis since 01/2015  . Hypertension   . Type 1 diabetes ([facility])     Family History  Problem Relation Age of Onset  . Diabetes Mother     Social History   Socioeconomic History  . Marital status: Single    Spouse name: Not on file  . Number of  children: Not on file  . Years of education: Not on file  . Highest education level: Not on file  Social Needs  . Financial resource strain: Not on file  . Food insecurity - worry: Not on file  . Food insecurity - inability: Not on file  . Transportation needs - medical: Not on file  . Transportation needs - non-medical: Not on file  Occupational History  . Not on file  Tobacco Use  . Smoking status: Never Smoker  . Smokeless tobacco: Never Used  Substance and Sexual Activity  . Alcohol use: No    Alcohol/week: 0.0 oz  . Drug use: No  . Sexual activity: No  Other Topics Concern  . Not on file  Social History Narrative  . Not on file    Past Surgical History:  Procedure Laterality Date  . CATARACT EXTRACTION W/ INTRAOCULAR LENS IMPLANT Bilateral 2015  . CYST REMOVAL TRUNK    . GYNECOMASTIA EXCISION  1993  . KIDNEY TRANSPLANT       Medications Prior to Admission  Medication Sig Dispense Refill Last Dose  . aspirin EC 81 MG tablet Take 1 tablet daily by mouth.   03/22/2017 at Unknown time  . insulin aspart (NOVOLOG) 100 UNIT/ML injection Inject 3 Units into the skin 3 (three) times daily with meals.    03/21/2017 at 1200  . Multiple Vitamin (MULTIVITAMIN WITH MINERALS) TABS tablet Take 1 tablet by mouth daily.   03/22/2017 at Unknown time  .  mycophenolate (MYFORTIC) 180 MG EC tablet Take 2 tablets 2 (two) times daily by mouth.   03/22/2017 at Unknown time  . predniSONE (DELTASONE) 5 MG tablet Take 1 tablet daily by mouth.   03/22/2017 at Unknown time  . tacrolimus (PROGRAF) 1 MG capsule Take 2 capsules 2 (two) times daily by mouth.   03/22/2017 at Unknown time  . guaiFENesin (MUCINEX) 600 MG 12 hr tablet Take 1 tablet (600 mg total) by mouth 2 (two) times daily. (Patient not taking: Reported on 03/22/2017) 14 tablet 0 Not Taking at Unknown time  . insulin glargine (LANTUS) 100 UNIT/ML injection Inject 0.18 mLs (18 Units total) into the skin daily. (Patient not taking: Reported  on 03/22/2017) 10 mL 11 Not Taking at Unknown time  . ondansetron (ZOFRAN ODT) 4 MG disintegrating tablet Allow 1-2 tablets to dissolve in your mouth every 8 hours as needed for nausea/vomiting (Patient not taking: Reported on 03/22/2017) 30 tablet 0 Not Taking at Unknown time    Physical Exam: Blood pressure 103/63, pulse 88, temperature 98.4 F (36.9 C), temperature source Oral, resp. rate 18, height 5\' 8"  (1.727 m), weight 62.7 kg (138 lb 4.8 oz), SpO2 99 %.   Wt Readings from Last 1 Encounters:  03/23/17 62.7 kg (138 lb 4.8 oz)     General appearance: alert and cooperative Head: Normocephalic, without obvious abnormality, atraumatic Resp: clear to auscultation bilaterally Chest wall: no tenderness Cardio: regular rate and rhythm GI: abnormal findings:  ascites and moderate tenderness in the periumbilical area Extremities: edema 1-2+ edema Neurologic: Grossly normal  Labs:   Lab Results  Component Value Date   WBC 12.3 (H) 03/22/2017   HGB 13.2 03/22/2017   HCT 40.8 03/22/2017   MCV 94.2 03/22/2017   PLT 259 03/22/2017    Recent Labs  Lab 03/22/17 1348 03/23/17 0345  NA 132* 135  K 5.1 4.3  CL 99* 106  CO2 14* 22  BUN 35* 30*  CREATININE 1.40* 1.32*  CALCIUM 9.7 8.5*  PROT 7.0  --   BILITOT 2.6*  --   ALKPHOS 84  --   ALT 39  --   AST 33  --   GLUCOSE 509* 230*   Lab Results  Component Value Date   TROPONINI 0.05 ([place]) 03/23/2017      Radiology: CXR-no pulmonary edema EKG: nsr  ASSESSMENT AND PLAN:    Pt with history of renal and pancreatic trpt with adequately functioning renal trp and had pancreatectomy of the transplanted organ due to leak and necrosis. He is being considered for repeat pancreatic trpt. He wa admitted with chest pain atypical for ischemia. He has ruled out for an mi. Pain has resolved after hydration and having his glycemic control improved. He is currently pain free. Renal funciotn is improved.    Plan:  Chest pain:  Ambulate this  am and follow for further symptoms. If stable, would discharge on current meds and will schedule outpatient stress echo at [facility]  Renal/Pancreatic  disease.-improved. Being followed at [facility] renal trpt clinic  DM-improved. COntinue with insulin pump followed by endo.   Signed: Teodoro Spray MD, [facility] 03/23/2017, 9:22 AM

## 2017-03-23 NOTE — Discharge Summary (Signed)
[facility] [place] NAME: Gabriel Huff    MR#:  [id number]  DATE OF BIRTH:  02/23/1976  DATE OF ADMISSION:  03/22/2017 ADMITTING PHYSICIAN: Baxter Hire, MD  DATE OF DISCHARGE: 03/23/17  PRIMARY CARE PHYSICIAN: Kathrine Haddock, NP    ADMISSION DIAGNOSIS:  Hyperglycemia [R73.9] Chest pain, unspecified type [R07.9]  DISCHARGE DIAGNOSIS:  Chest pain--atypical DM-1 on Insulin pump H/p Renal and pancreas transplant SECONDARY DIAGNOSIS:   Past Medical History:  Diagnosis Date  . Cataract    bilateral  . End stage renal disease ([place])    peritoneal dialysis since 01/2015  . Hypertension   . Type 1 diabetes [facility])     HOSPITAL COURSE:  Pt is a 41 yo male with history of type 1 dm, hypertension, history of renal and pancreatic trpt in 03/5725 complicated post op by pancreatic leak and necrosis requiring pancreatectomy. He is awaiting consideration regarding being listed back on the pancreatic trpt list. He has been doing fairly well with regard to his kidney trpt however has BK viremia.He is currently on an insulin pump and is treated with prograf, cellcept and prednisone. He was admitted with discomfort in his chest and has ruled out for an m  1. Chest pain. He does have risk factors for coronary artery disease.  - on aspirin. C -seen by Dr Ubaldo Glassing -stress echo as out pt -pt is CP free -ruled out for MI  2. Hyperosmolar nonketotic state. Blood sugars are extremely high.  -pt has insulin refills at his pharmacy which he was not aware of He received lantus yday and today hence I have asked him to resume his pump from tomorrow 03/24/17  3. Acute renal failure. He does have a transplanted kidney. Not sure what her baseline creatinine has been post transplant. Creatinine is 1.4 today. -received IVF  4. Status post kidney transplant. - continue all of his antirejection medications.   5. Status post pancreas transplant. First  transplant has failed. He is under consideration for another transplant.  He is baseline. Will d/c him with out pt stress echo Pt is agreeable  CONSULTS OBTAINED:  Treatment Team:  Teodoro Spray, MD  DRUG ALLERGIES:   Allergies  Allergen Reactions  . Losartan Swelling and Other (See Comments)    Reaction:  Feet/ankle swelling     DISCHARGE MEDICATIONS:   Current Discharge Medication List    START taking these medications   Details  !! insulin aspart (NOVOLOG) 100 UNIT/ML injection Use your insulin pump as before per instructions upt 50 units/day Qty: 10 mL, Refills: 0     !! - Potential duplicate medications found. Please discuss with provider.    CONTINUE these medications which have NOT CHANGED   Details  aspirin EC 81 MG tablet Take 1 tablet daily by mouth.    !! insulin aspart (NOVOLOG) 100 UNIT/ML injection Inject 3 Units into the skin 3 (three) times daily with meals.     Multiple Vitamin (MULTIVITAMIN WITH MINERALS) TABS tablet Take 1 tablet by mouth daily.    mycophenolate (MYFORTIC) 180 MG EC tablet Take 2 tablets 2 (two) times daily by mouth.    predniSONE (DELTASONE) 5 MG tablet Take 1 tablet daily by mouth.    tacrolimus (PROGRAF) 1 MG capsule Take 2 capsules 2 (two) times daily by mouth.    guaiFENesin (MUCINEX) 600 MG 12 hr tablet Take 1 tablet (600 mg total) by mouth 2 (two) times daily. Qty: 14 tablet,  Refills: 0    ondansetron (ZOFRAN ODT) 4 MG disintegrating tablet Allow 1-2 tablets to dissolve in your mouth every 8 hours as needed for nausea/vomiting Qty: 30 tablet, Refills: 0     !! - Potential duplicate medications found. Please discuss with provider.    STOP taking these medications     insulin glargine (LANTUS) 100 UNIT/ML injection         If you experience worsening of your admission symptoms, develop shortness of breath, life threatening emergency, suicidal or homicidal thoughts you must seek medical attention immediately by calling  911 or calling your MD immediately  if symptoms less severe.  You Must read complete instructions/literature along with all the possible adverse reactions/side effects for all the Medicines you take and that have been prescribed to you. Take any new Medicines after you have completely understood and accept all the possible adverse reactions/side effects.   Please note  You were cared for by a hospitalist during your hospital stay. If you have any questions about your discharge medications or the care you received while you were in the hospital after you are discharged, you can call the unit and asked to speak with the hospitalist on call if the hospitalist that took care of you is not available. Once you are discharged, your primary care physician will handle any further medical issues. Please note that NO REFILLS for any discharge medications will be authorized once you are discharged, as it is imperative that you return to your primary care physician (or establish a relationship with a primary care physician if you do not have one) for your aftercare needs so that they can reassess your need for medications and monitor your lab values. Today   SUBJECTIVE   Doing well  VITAL SIGNS:  Blood pressure 103/63, pulse 88, temperature 98.4 F (36.9 C), temperature source Oral, resp. rate 18, height 5\' 8"  (1.727 m), weight 62.7 kg (138 lb 4.8 oz), SpO2 99 %.  I/O:    Intake/Output Summary (Last 24 hours) at 03/23/2017 1323 Last data filed at 03/23/2017 0956 Gross per 24 hour  Intake 2995 ml  Output 575 ml  Net 2420 ml    PHYSICAL EXAMINATION:  GENERAL:  41 y.o.-year-old patient lying in the bed with no acute distress.  EYES: Pupils equal, round, reactive to light and accommodation. No scleral icterus. Extraocular muscles intact.  HEENT: Head atraumatic, normocephalic. Oropharynx and nasopharynx clear.  NECK:  Supple, no jugular venous distention. No thyroid enlargement, no tenderness.   LUNGS: Normal breath sounds bilaterally, no wheezing, rales,rhonchi or crepitation. No use of accessory muscles of respiration.  CARDIOVASCULAR: S1, S2 normal. No murmurs, rubs, or gallops.  ABDOMEN: Soft, non-tender, non-distended. Bowel sounds present. No organomegaly or mass.  EXTREMITIES: No pedal edema, cyanosis, or clubbing.  NEUROLOGIC: Cranial nerves II through XII are intact. Muscle strength 5/5 in all extremities. Sensation intact. Gait not checked.  PSYCHIATRIC: The patient is alert and oriented x 3.  SKIN: No obvious rash, lesion, or ulcer.   DATA REVIEW:   CBC  Recent Labs  Lab 03/22/17 1348  WBC 12.3*  HGB 13.2  HCT 40.8  PLT 259    Chemistries  Recent Labs  Lab 03/22/17 1348 03/23/17 0345  NA 132* 135  K 5.1 4.3  CL 99* 106  CO2 14* 22  GLUCOSE 509* 230*  BUN 35* 30*  CREATININE 1.40* 1.32*  CALCIUM 9.7 8.5*  AST 33  --   ALT 39  --  ALKPHOS 84  --   BILITOT 2.6*  --     Microbiology Results   No results found for this or any previous visit (from the past 240 hour(s)).  RADIOLOGY:  Dg Chest Portable 1 View  Result Date: 03/22/2017 CLINICAL DATA:  Chest pain.  End-stage renal disease EXAM: PORTABLE CHEST 1 VIEW COMPARISON:  November 30, 2014 FINDINGS: There is no edema or consolidation. The heart size and pulmonary vascularity are normal. No adenopathy. No pneumothorax. No bone lesions. IMPRESSION: No edema or consolidation. Electronically Signed   By: Lowella Grip III M.D.   On: 03/22/2017 14:05     Management plans discussed with the patient, family and they are in agreement.  CODE STATUS:     Code Status Orders  (From admission, onward)        Start     Ordered   03/22/17 2059  Full code  Continuous     03/22/17 2058    Code Status History    Date Active Date Inactive Code Status Order ID Comments User Context   07/27/2015 16:18 07/28/2015 16:40 Full Code [id number]  Bettey Costa, MD Inpatient   12/06/2014 14:57 12/06/2014 20:01 Full  Code [id number]  Schnier, Dolores Lory, MD Inpatient      TOTAL TIME TAKING CARE OF THIS PATIENT:40* minutes.    Zeola Brys M.D on 03/23/2017 at 1:23 PM  Between 7am to 6pm - Pager - [phone] After 6pm go to [email or web address] - password [email or web address] [facility]  Office  [phone]  CC: Primary care physician; Kathrine Haddock, NP

## 2017-03-23 NOTE — Discharge Instructions (Signed)
Keep your appt with dr Gabriel Carina for nov 14th

## 2017-03-24 LAB — HIV ANTIBODY (ROUTINE TESTING W REFLEX): HIV SCREEN 4TH GENERATION: NONREACTIVE

## 2017-03-25 ENCOUNTER — Inpatient Hospital Stay: Payer: Self-pay | Admitting: Unknown Physician Specialty

## 2019-01-16 ENCOUNTER — Emergency Department: Payer: Medicare Other

## 2019-01-16 ENCOUNTER — Encounter: Payer: Self-pay | Admitting: Emergency Medicine

## 2019-01-16 ENCOUNTER — Other Ambulatory Visit: Payer: Self-pay

## 2019-01-16 ENCOUNTER — Inpatient Hospital Stay
Admission: EM | Admit: 2019-01-16 | Discharge: 2019-01-17 | DRG: 871 | Disposition: A | Discharge status: Other Acute Inpatient Hospital | Payer: Medicare Other | Attending: Internal Medicine | Admitting: Internal Medicine

## 2019-01-16 DIAGNOSIS — E10649 Type 1 diabetes mellitus with hypoglycemia without coma: Secondary | ICD-10-CM | POA: Diagnosis present

## 2019-01-16 DIAGNOSIS — Z7982 Long term (current) use of aspirin: Secondary | ICD-10-CM

## 2019-01-16 DIAGNOSIS — Z9641 Presence of insulin pump (external) (internal): Secondary | ICD-10-CM | POA: Diagnosis present

## 2019-01-16 DIAGNOSIS — Z940 Kidney transplant status: Secondary | ICD-10-CM

## 2019-01-16 DIAGNOSIS — Z885 Allergy status to narcotic agent status: Secondary | ICD-10-CM

## 2019-01-16 DIAGNOSIS — Y830 Surgical operation with transplant of whole organ as the cause of abnormal reaction of the patient, or of later complication, without mention of misadventure at the time of the procedure: Secondary | ICD-10-CM | POA: Diagnosis present

## 2019-01-16 DIAGNOSIS — Z20828 Contact with and (suspected) exposure to other viral communicable diseases: Secondary | ICD-10-CM | POA: Diagnosis present

## 2019-01-16 DIAGNOSIS — Z7952 Long term (current) use of systemic steroids: Secondary | ICD-10-CM

## 2019-01-16 DIAGNOSIS — I10 Essential (primary) hypertension: Secondary | ICD-10-CM | POA: Diagnosis present

## 2019-01-16 DIAGNOSIS — R569 Unspecified convulsions: Secondary | ICD-10-CM | POA: Diagnosis present

## 2019-01-16 DIAGNOSIS — R159 Full incontinence of feces: Secondary | ICD-10-CM | POA: Diagnosis present

## 2019-01-16 DIAGNOSIS — A419 Sepsis, unspecified organism: Secondary | ICD-10-CM | POA: Diagnosis present

## 2019-01-16 DIAGNOSIS — Z4659 Encounter for fitting and adjustment of other gastrointestinal appliance and device: Secondary | ICD-10-CM

## 2019-01-16 DIAGNOSIS — Z79899 Other long term (current) drug therapy: Secondary | ICD-10-CM | POA: Diagnosis not present

## 2019-01-16 DIAGNOSIS — K5900 Constipation, unspecified: Secondary | ICD-10-CM | POA: Diagnosis present

## 2019-01-16 DIAGNOSIS — K567 Ileus, unspecified: Secondary | ICD-10-CM | POA: Diagnosis present

## 2019-01-16 DIAGNOSIS — Z888 Allergy status to other drugs, medicaments and biological substances status: Secondary | ICD-10-CM

## 2019-01-16 DIAGNOSIS — T86891 Other transplanted tissue failure: Secondary | ICD-10-CM | POA: Diagnosis present

## 2019-01-16 DIAGNOSIS — J690 Pneumonitis due to inhalation of food and vomit: Secondary | ICD-10-CM | POA: Diagnosis present

## 2019-01-16 DIAGNOSIS — E876 Hypokalemia: Secondary | ICD-10-CM | POA: Diagnosis present

## 2019-01-16 DIAGNOSIS — R4182 Altered mental status, unspecified: Secondary | ICD-10-CM

## 2019-01-16 DIAGNOSIS — H21562 Pupillary abnormality, left eye: Secondary | ICD-10-CM | POA: Diagnosis present

## 2019-01-16 DIAGNOSIS — G9340 Encephalopathy, unspecified: Secondary | ICD-10-CM | POA: Diagnosis present

## 2019-01-16 DIAGNOSIS — Z833 Family history of diabetes mellitus: Secondary | ICD-10-CM | POA: Diagnosis not present

## 2019-01-16 DIAGNOSIS — E162 Hypoglycemia, unspecified: Secondary | ICD-10-CM

## 2019-01-16 DIAGNOSIS — G9349 Other encephalopathy: Secondary | ICD-10-CM | POA: Diagnosis present

## 2019-01-16 DIAGNOSIS — K920 Hematemesis: Secondary | ICD-10-CM | POA: Diagnosis present

## 2019-01-16 DIAGNOSIS — K8689 Other specified diseases of pancreas: Secondary | ICD-10-CM | POA: Diagnosis present

## 2019-01-16 DIAGNOSIS — E1065 Type 1 diabetes mellitus with hyperglycemia: Secondary | ICD-10-CM | POA: Diagnosis present

## 2019-01-16 DIAGNOSIS — Z7682 Awaiting organ transplant status: Secondary | ICD-10-CM

## 2019-01-16 LAB — CBC WITH DIFFERENTIAL/PLATELET
Abs Immature Granulocytes: 0.08 10*3/uL — ABNORMAL HIGH (ref 0.00–0.07)
Abs Immature Granulocytes: 0.07 10*3/uL (ref 0.00–0.07)
Basophils Absolute: 0.1 10*3/uL (ref 0.0–0.1)
Basophils Absolute: 0.1 10*3/uL (ref 0.0–0.1)
Basophils Relative: 0 %
Basophils Relative: 0 %
Eosinophils Absolute: 0 10*3/uL (ref 0.0–0.5)
Eosinophils Absolute: 0 10*3/uL (ref 0.0–0.5)
Eosinophils Relative: 0 %
Eosinophils Relative: 0 %
HCT: 39.7 % (ref 39.0–52.0)
HCT: 36.7 % — ABNORMAL LOW (ref 39.0–52.0)
Hemoglobin: 13.3 g/dL (ref 13.0–17.0)
Hemoglobin: 12.2 g/dL — ABNORMAL LOW (ref 13.0–17.0)
Immature Granulocytes: 1 %
Immature Granulocytes: 0 %
Lymphocytes Relative: 3 %
Lymphocytes Relative: 5 %
Lymphs Abs: 0.4 10*3/uL — ABNORMAL LOW (ref 0.7–4.0)
Lymphs Abs: 0.9 10*3/uL (ref 0.7–4.0)
MCH: 30.6 pg (ref 26.0–34.0)
MCH: 30.3 pg (ref 26.0–34.0)
MCHC: 33.5 g/dL (ref 30.0–36.0)
MCHC: 33.2 g/dL (ref 30.0–36.0)
MCV: 91.5 fL (ref 80.0–100.0)
MCV: 91.3 fL (ref 80.0–100.0)
Monocytes Absolute: 2 10*3/uL — ABNORMAL HIGH (ref 0.1–1.0)
Monocytes Absolute: 2.1 10*3/uL — ABNORMAL HIGH (ref 0.1–1.0)
Monocytes Relative: 15 %
Monocytes Relative: 12 %
Neutro Abs: 11.2 10*3/uL — ABNORMAL HIGH (ref 1.7–7.7)
Neutro Abs: 14.2 10*3/uL — ABNORMAL HIGH (ref 1.7–7.7)
Neutrophils Relative %: 81 %
Neutrophils Relative %: 83 %
Platelets: 206 10*3/uL (ref 150–400)
Platelets: 203 10*3/uL (ref 150–400)
RBC: 4.34 MIL/uL (ref 4.22–5.81)
RBC: 4.02 MIL/uL — ABNORMAL LOW (ref 4.22–5.81)
RDW: 13.4 % (ref 11.5–15.5)
RDW: 13.3 % (ref 11.5–15.5)
WBC: 13.8 10*3/uL — ABNORMAL HIGH (ref 4.0–10.5)
WBC: 17.3 10*3/uL — ABNORMAL HIGH (ref 4.0–10.5)
nRBC: 0 % (ref 0.0–0.2)
nRBC: 0 % (ref 0.0–0.2)

## 2019-01-16 LAB — COMPREHENSIVE METABOLIC PANEL
ALT: 40 U/L (ref 0–44)
AST: 31 U/L (ref 15–41)
Albumin: 4.2 g/dL (ref 3.5–5.0)
Alkaline Phosphatase: 68 U/L (ref 38–126)
Anion gap: 10 (ref 5–15)
BUN: 22 mg/dL — ABNORMAL HIGH (ref 6–20)
CO2: 25 mmol/L (ref 22–32)
Calcium: 9.5 mg/dL (ref 8.9–10.3)
Chloride: 107 mmol/L (ref 98–111)
Creatinine, Ser: 1.21 mg/dL (ref 0.61–1.24)
GFR calc Af Amer: >60 mL/min (ref >60)
GFR calc non Af Amer: >60 mL/min (ref >60)
Glucose, Bld: 134 mg/dL — ABNORMAL HIGH (ref 70–99)
Potassium: 3.1 mmol/L — ABNORMAL LOW (ref 3.5–5.1)
Sodium: 142 mmol/L (ref 135–145)
Total Bilirubin: 1.4 mg/dL — ABNORMAL HIGH (ref 0.3–1.2)
Total Protein: 6.8 g/dL (ref 6.5–8.1)

## 2019-01-16 LAB — URINE DRUG SCREEN, QUALITATIVE (ARMC ONLY)
Amphetamines, Ur Screen: NOT DETECTED
Barbiturates, Ur Screen: NOT DETECTED
Benzodiazepine, Ur Scrn: NOT DETECTED
Cannabinoid 50 Ng, Ur ~~LOC~~: NOT DETECTED
Cocaine Metabolite,Ur ~~LOC~~: NOT DETECTED
MDMA (Ecstasy)Ur Screen: NOT DETECTED
Methadone Scn, Ur: NOT DETECTED
Opiate, Ur Screen: NOT DETECTED
Phencyclidine (PCP) Ur S: NOT DETECTED
Tricyclic, Ur Screen: NOT DETECTED

## 2019-01-16 LAB — BASIC METABOLIC PANEL
Anion gap: 10 (ref 5–15)
BUN: 20 mg/dL (ref 6–20)
CO2: 22 mmol/L (ref 22–32)
Calcium: 8.5 mg/dL — ABNORMAL LOW (ref 8.9–10.3)
Chloride: 105 mmol/L (ref 98–111)
Creatinine, Ser: 1.16 mg/dL (ref 0.61–1.24)
GFR calc Af Amer: >60 mL/min (ref >60)
GFR calc non Af Amer: >60 mL/min (ref >60)
Glucose, Bld: 316 mg/dL — ABNORMAL HIGH (ref 70–99)
Potassium: 4.6 mmol/L (ref 3.5–5.1)
Sodium: 137 mmol/L (ref 135–145)

## 2019-01-16 LAB — SARS CORONAVIRUS 2 BY RT PCR (HOSPITAL ORDER, PERFORMED IN ~~LOC~~ HOSPITAL LAB): SARS Coronavirus 2: NEGATIVE

## 2019-01-16 LAB — GLUCOSE, CAPILLARY
Glucose-Capillary: 129 mg/dL — ABNORMAL HIGH (ref 70–99)
Glucose-Capillary: 87 mg/dL (ref 70–99)
Glucose-Capillary: 90 mg/dL (ref 70–99)
Glucose-Capillary: 83 mg/dL (ref 70–99)
Glucose-Capillary: 125 mg/dL — ABNORMAL HIGH (ref 70–99)
Glucose-Capillary: 321 mg/dL — ABNORMAL HIGH (ref 70–99)
Glucose-Capillary: 306 mg/dL — ABNORMAL HIGH (ref 70–99)

## 2019-01-16 LAB — URINALYSIS, ROUTINE W REFLEX MICROSCOPIC
Bacteria, UA: NONE SEEN
Bilirubin Urine: NEGATIVE
Glucose, UA: >=500 mg/dL — AB
Hgb urine dipstick: NEGATIVE
Ketones, ur: 20 mg/dL — AB
Leukocytes,Ua: NEGATIVE
Nitrite: NEGATIVE
Protein, ur: NEGATIVE mg/dL
Specific Gravity, Urine: 1.026 (ref 1.005–1.030)
Squamous Epithelial / HPF: NONE SEEN (ref 0–5)
pH: 6 (ref 5.0–8.0)

## 2019-01-16 LAB — APTT: aPTT: 24 seconds (ref 24–36)

## 2019-01-16 LAB — ABO/RH: ABO/RH(D): O POS

## 2019-01-16 LAB — PROTIME-INR
INR: 1 (ref 0.8–1.2)
Prothrombin Time: 13.1 seconds (ref 11.4–15.2)

## 2019-01-16 LAB — ETHANOL: Alcohol, Ethyl (B): <10 mg/dL (ref <10)

## 2019-01-16 LAB — LIPASE, BLOOD: Lipase: 14 U/L (ref 11–51)

## 2019-01-16 LAB — SALICYLATE LEVEL: Salicylate Lvl: <7 mg/dL (ref 2.8–30.0)

## 2019-01-16 LAB — CK: Total CK: 382 U/L (ref 49–397)

## 2019-01-16 LAB — ACETAMINOPHEN LEVEL: Acetaminophen (Tylenol), Serum: <10 ug/mL — ABNORMAL LOW (ref 10–30)

## 2019-01-16 LAB — LACTIC ACID, PLASMA
Lactic Acid, Venous: 2.1 mmol/L (ref 0.5–1.9)
Lactic Acid, Venous: 1.1 mmol/L (ref 0.5–1.9)

## 2019-01-16 MED ORDER — SODIUM CHLORIDE 0.9 % IV SOLN
8.0000 mg/h | INTRAVENOUS | Status: DC
  Administered 2019-01-16 – 2019-01-17 (×3): 8 mg/h via INTRAVENOUS
  Filled 2019-01-16 (×3): qty 80

## 2019-01-16 MED ORDER — LORAZEPAM 2 MG/ML IJ SOLN
1.0000 mg | Freq: Once | INTRAMUSCULAR | Status: AC
  Administered 2019-01-16: 1 mg via INTRAVENOUS

## 2019-01-16 MED ORDER — VANCOMYCIN HCL 1.5 G IV SOLR
1500.0000 mg | Freq: Once | INTRAVENOUS | Status: AC
  Administered 2019-01-16: 14:00:00 1500 mg via INTRAVENOUS
  Filled 2019-01-16: qty 1500

## 2019-01-16 MED ORDER — SODIUM CHLORIDE 0.9 % IV BOLUS
1000.0000 mL | Freq: Once | INTRAVENOUS | Status: AC
  Administered 2019-01-16: 12:00:00 1000 mL via INTRAVENOUS

## 2019-01-16 MED ORDER — DEXTROSE 10 % IV SOLN
INTRAVENOUS | Status: DC
  Administered 2019-01-16: 12:00:00 via INTRAVENOUS

## 2019-01-16 MED ORDER — LEVETIRACETAM IN NACL 500 MG/100ML IV SOLN
500.0000 mg | Freq: Two times a day (BID) | INTRAVENOUS | Status: DC
  Administered 2019-01-17: 11:00:00 500 mg via INTRAVENOUS
  Filled 2019-01-16 (×3): qty 100

## 2019-01-16 MED ORDER — VANCOMYCIN HCL IN DEXTROSE 1-5 GM/200ML-% IV SOLN: 1000.0000 mg | Freq: Once | INTRAVENOUS | Status: DC

## 2019-01-16 MED ORDER — DEXTROSE 50 % IV SOLN
INTRAVENOUS | Status: AC
  Filled 2019-01-16: qty 50

## 2019-01-16 MED ORDER — LIDOCAINE HCL URETHRAL/MUCOSAL 2 % EX GEL
1.0000 "application " | Freq: Once | CUTANEOUS | Status: DC
  Filled 2019-01-16: qty 10

## 2019-01-16 MED ORDER — LORAZEPAM 2 MG/ML IJ SOLN
0.5000 mg | Freq: Once | INTRAMUSCULAR | Status: AC
  Administered 2019-01-16: 18:00:00 0.5 mg via INTRAVENOUS

## 2019-01-16 MED ORDER — SODIUM CHLORIDE 0.9 % IV SOLN
80.0000 mg | Freq: Once | INTRAVENOUS | Status: AC
  Administered 2019-01-16: 80 mg via INTRAVENOUS
  Filled 2019-01-16: qty 80

## 2019-01-16 MED ORDER — LEVETIRACETAM IN NACL 1000 MG/100ML IV SOLN
1000.0000 mg | Freq: Once | INTRAVENOUS | Status: AC
  Administered 2019-01-16: 22:00:00 1000 mg via INTRAVENOUS
  Filled 2019-01-16: qty 100

## 2019-01-16 MED ORDER — SODIUM CHLORIDE 0.9 % IV SOLN: 10.0000 mL/h | Freq: Once | INTRAVENOUS | Status: DC

## 2019-01-16 MED ORDER — SODIUM CHLORIDE 0.9 % IV SOLN
75.0000 mL/h | INTRAVENOUS | Status: DC
  Administered 2019-01-17 (×2): 75 mL/h via INTRAVENOUS

## 2019-01-16 MED ORDER — POTASSIUM CHLORIDE 10 MEQ/100ML IV SOLN
10.0000 meq | INTRAVENOUS | Status: AC
  Filled 2019-01-16 (×2): qty 100

## 2019-01-16 MED ORDER — LORAZEPAM 2 MG/ML IJ SOLN
INTRAMUSCULAR | Status: AC
  Filled 2019-01-16: qty 1

## 2019-01-16 MED ORDER — SODIUM CHLORIDE 0.9 % IV SOLN
2.0000 g | Freq: Once | INTRAVENOUS | Status: AC
  Administered 2019-01-16: 2 g via INTRAVENOUS
  Filled 2019-01-16: qty 2

## 2019-01-16 MED ORDER — IOHEXOL 300 MG/ML  SOLN
100.0000 mL | Freq: Once | INTRAMUSCULAR | Status: AC | PRN
  Administered 2019-01-16: 100 mL via INTRAVENOUS

## 2019-01-16 MED ORDER — LORAZEPAM 2 MG/ML IJ SOLN
0.5000 mg | Freq: Once | INTRAMUSCULAR | Status: AC
  Administered 2019-01-16: 17:00:00 0.5 mg via INTRAVENOUS

## 2019-01-16 MED ORDER — METRONIDAZOLE IN NACL 5-0.79 MG/ML-% IV SOLN
500.0000 mg | Freq: Once | INTRAVENOUS | Status: AC
  Administered 2019-01-16: 13:00:00 500 mg via INTRAVENOUS
  Filled 2019-01-16: qty 100

## 2019-01-16 NOTE — Progress Notes (Signed)
PHARMACY -  BRIEF ANTIBIOTIC NOTE   Pharmacy has received consult(s) for vancomycin and cefepime from an ED provider.  The patient's profile has been reviewed for ht/wt/allergies/indication/available labs.    No weight recorded this visit in chart. Per outside records recent weight recorded 12/23/2018 was 68.9 kg.   One time order(s) placed for vancomycin 1.5g and cefepime 2g  Further antibiotics/pharmacy consults should be ordered by admitting physician if indicated.                       Thank you, Benita Gutter 01/16/2019  11:24 AM

## 2019-01-16 NOTE — ED Provider Notes (Signed)
Patient vomits 1 more time.  No coffee grounds in this vomit episode.  He has a good gag reflex.  His sats are good.  Be going up to the ICU soon.  Do does not have a bed yet.  He says they get a bed they will let us know we can try to transfer him then.  MRI shows posterior hippocampal changes consistent with hypoglycemic encephalopathy.  Discussed this with [place] neurology Dr. Shan Levans.  She feels the patient could improve.  [facility] still looking for bed at this point will keep the patient here upstairs for now.   Nena Polio, MD 01/16/19 2126

## 2019-01-16 NOTE — ED Provider Notes (Signed)
Patient in the ER is now vomiting blood.  It looks like coffee grounds.  I have ordered some Protonix bolus and drip for him type and cross.  Also the CT of the neck showed some groundglass stuff in his lungs.  I will CT his chest as well to see with going on there.  Patient has not put out any urine in his Foley currently.  He is getting fluids.  GFR in his transplant is greater than 60.  Repeat his CBC and a be met see how those are doing.  Hospitalist knows about the patient.   Nena Polio, MD 01/16/19 [phone]

## 2019-01-16 NOTE — ED Notes (Addendum)
Family requesting pt go to [facility] - had a kidney transplant approx 4 weeks ago.  Error: transplant was actually in 2018

## 2019-01-16 NOTE — ED Notes (Signed)
Pt to mri 

## 2019-01-16 NOTE — H&P (Signed)
[facility] NAME: Gabriel Huff    MR#:  [id number]  DATE OF BIRTH:  11-10-1975  DATE OF ADMISSION:  01/16/2019  PRIMARY CARE PHYSICIAN: Kathrine Haddock, NP   REQUESTING/REFERRING PHYSICIAN:  CHIEF COMPLAINT:   Chief Complaint  Patient presents with  . Altered Mental Status   HISTORY OF PRESENT ILLNESS:   43 year old male with past medical history of kidney and pancreas transplant on 03/18/2016 at [facility].  Unfortunately his pancreas transplant failed due to complications now on pancreas transplant list at [facility], ESRD was on PD prior to kidney transplant, hypertension, Diabetes mellitus on the Medtronic 630 G insulin pump presenting to the ED today with unresponsiveness.  Per patient's father who is currently at the bedside, patient lives by himself and is independent of ADLs.  He last saw him on Friday when he came over to his house to have breakfast with him at around 8 AM and left at around 11 AM that morning. Patient's friend talked to him on Saturday and today when they attempted to call him he would not respond.  Family members got concerned and went to check on him but he would not open the door they therefore called EMS and fire department who broke the house and found him unresponsive on the bed. Per patient's father it appeared patient had washed and combed his hair preparing to go to church this morning. Per EMS reports, patient was found to have blood sugar of 41 with insulin pump on which was removed.  He was also noted to have lost control of his bladder and bowel with blood in his mouth.  On arrival to the ED, he was afebrile with blood pressure 143/104 mm Hg and pulse rate 108 beats/min. There were no focal neurological deficits; but remained encephalopathic.  Initial labs in the ED revealed glucose 129 post dextrose, WBC 13.9, potassium 3.1, bilirubin 1.4, acid 2.1, urinalysis showed presence of ketones otherwise no evidence of  UTI.  All other labs unremarkable.  Noncontrast CT head and CT cervical spine negative for acute abnormality. CT chest showed bilateral multilobar pneumonia.  Given this finding hospitalist were contacted for admission however on speaking with the patient's father he preferred that patient be transferred to [facility] given his history of transplant and continued care at [facility].  [facility] transfer center contacted pending placement with bed availability.  Patient will be admitted to the ICU for continued monitoring pending transfer to [facility].  PAST MEDICAL HISTORY:   Past Medical History:  Diagnosis Date  . Cataract    bilateral  . End stage renal disease ([place])    peritoneal dialysis since 01/2015  . Hypertension   . Type 1 diabetes ([place])     PAST SURGICAL HISTORY:   Past Surgical History:  Procedure Laterality Date  . CAPD INSERTION N/A 12/06/2014   Procedure: LAPAROSCOPIC INSERTION CONTINUOUS AMBULATORY PERITONEAL DIALYSIS  (CAPD) CATHETER;  Surgeon: Katha Cabal, MD;  Location: [facility] ORS;  Service: Vascular;  Laterality: N/A;  . CATARACT EXTRACTION W/ INTRAOCULAR LENS IMPLANT Bilateral 2015  . CYST REMOVAL TRUNK    . GYNECOMASTIA EXCISION  1993  . KIDNEY TRANSPLANT      SOCIAL HISTORY:   Social History   Tobacco Use  . Smoking status: Never Smoker  . Smokeless tobacco: Never Used  Substance Use Topics  . Alcohol use: No    Alcohol/week: 0.0 standard drinks    FAMILY HISTORY:   Family History  Problem Relation Age of Onset  . Diabetes Mother     DRUG ALLERGIES:   Allergies  Allergen Reactions  . Losartan Swelling and Other (See Comments)    Reaction:  Feet/ankle swelling   . Oxycodone Nausea Only    REVIEW OF SYSTEMS:   ROS  unable to assess due to altered mental status MEDICATIONS AT HOME:   Prior to Admission medications   Medication Sig Start Date End Date Taking? Authorizing Provider  atorvastatin (LIPITOR) 20 MG tablet Take 20 mg by mouth daily. 12/20/18  Yes  [provider]  insulin aspart (NOVOLOG) 100 UNIT/ML injection Use your insulin pump as before per instructions upt 50 units/day 03/24/17  Yes Fritzi Mandes, MD  tacrolimus (PROGRAF) 0.5 MG capsule Take 3 mg by mouth every 12 (twelve) hours. 01/11/19  Yes [provider]  aspirin EC 81 MG tablet Take 1 tablet daily by mouth. 03/21/16   [provider]  Multiple Vitamin (MULTIVITAMIN WITH MINERALS) TABS tablet Take 1 tablet by mouth daily.    [provider]  mycophenolate (MYFORTIC) 180 MG EC tablet Take 2 tablets 2 (two) times daily by mouth. 04/30/16   [provider]  predniSONE (DELTASONE) 5 MG tablet Take 1 tablet daily by mouth. 05/20/16   [provider]      VITAL SIGNS:  Blood pressure 136/78, pulse (!) 108, temperature 97.8 F (36.6 C), temperature source Axillary, resp. rate 15, weight 80.8 kg, SpO2 97 %.  PHYSICAL EXAMINATION:   Physical Exam  GENERAL:  43 y.o.-year-old patient lying in the bed with no acute distress.  EYES: Pupils equal, round, reactive to light and accommodation. No scleral icterus. Extraocular muscles intact.  HEENT: Head atraumatic, normocephalic. Oropharynx and nasopharynx clear.  NECK:  Supple, no jugular venous distention. No thyroid enlargement, no tenderness.  LUNGS: Normal breath sounds bilaterally, no wheezing, rales,rhonchi or crepitation. No use of accessory muscles of respiration.  CARDIOVASCULAR: S1, S2 normal. No murmurs, rubs, or gallops.  ABDOMEN: Soft, nontender, nondistended. Bowel sounds present. No organomegaly or mass.  EXTREMITIES: No pedal edema, cyanosis, or clubbing. No rash or lesions. + pedal pulses MUSCULOSKELETAL: Normal bulk, and power was 5+ grip and elbow, knee, and ankle flexion and extension bilaterally.  NEUROLOGIC: Unable to assess orientation. Patient does not respond to verbal stimuli. Grimaces to deep sternal rub.  Does not follow commands.  No verbalizations noted. CN  2-12 :patient does respond confrontation bilaterally, pupils right 2 mm, left 2 mm,and reactive bilaterally. Spontaneous movement noted bilaterally. Withdraws BLE to painful stimuli, able to localize pain with BUE. Babinski is downgoing. DTR's (biceps, patellar, and achilles) 2+ and symmetric throughout. Gait not tested due to safety concern. PSYCHIATRIC: unable to assess SKIN: No obvious rash, lesion, or ulcer.   DATA REVIEWED:  LABORATORY PANEL:   CBC Recent Labs  Lab 01/16/19 1117  WBC 13.8*  HGB 13.3  HCT 39.7  PLT 206   ------------------------------------------------------------------------------------------------------------------  Chemistries  Recent Labs  Lab 01/16/19 1117  NA 142  K 3.1*  CL 107  CO2 25  GLUCOSE 134*  BUN 22*  CREATININE 1.21  CALCIUM 9.5  AST 31  ALT 40  ALKPHOS 68  BILITOT 1.4*   ------------------------------------------------------------------------------------------------------------------  Cardiac Enzymes No results for input(s): TROPONINI in the last 168 hours. ------------------------------------------------------------------------------------------------------------------  RADIOLOGY:  Ct Head Wo Contrast  Result Date: 01/16/2019 CLINICAL DATA:  PT found down. Unknown down time.  Pt with hx of DM EXAM: CT HEAD WITHOUT CONTRAST CT CERVICAL SPINE  WITHOUT CONTRAST TECHNIQUE: Multidetector CT imaging of the head and cervical spine was performed following the standard protocol without intravenous contrast. Multiplanar CT image reconstructions of the cervical spine were also generated. COMPARISON:  None. FINDINGS: CT HEAD FINDINGS Brain: Prominent cisterna magna or posterior fossa arachnoid cyst. There is no other intra or extra-axial fluid collection or mass lesion. The basilar cisterns and ventricles have a normal appearance. There is no CT evidence for acute infarction or hemorrhage. Vascular: No hyperdense vessel or unexpected  calcification. Skull: Normal. Negative for fracture or focal lesion. Sinuses/Orbits: Air-fluid levels within the LEFT maxillary and sphenoid sinuses. Significant mucosal thickening of the paranasal sinuses. Mastoid air cells are normally aerated. Orbits are unremarkable. No acute sinus wall fractures identified. Other: None. CT CERVICAL SPINE FINDINGS Alignment: Normal. Skull base and vertebrae: No acute fracture. No primary bone lesion or focal pathologic process. Soft tissues and spinal canal: No prevertebral fluid or swelling. No visible canal hematoma. Disc levels:  Unremarkable. Upper chest: Partially imaged ground-glass density in the posterior LEFT UPPER lobe. Other: None IMPRESSION: 1. No evidence for acute intracranial abnormality. 2. Prominent cisterna magna or posterior fossa arachnoid cyst. 3. Acute and chronic sinusitis. 4. No evidence for acute cervical spine abnormality. 5. Partially imaged ground-glass density in the posterior LEFT UPPER lobe. Consider further evaluation with chest x-ray. Electronically Signed   By: Nolon Nations M.D.   On: 01/16/2019 12:59   Ct Cervical Spine Wo Contrast  Result Date: 01/16/2019 CLINICAL DATA:  PT found down. Unknown down time.  Pt with hx of DM EXAM: CT HEAD WITHOUT CONTRAST CT CERVICAL SPINE WITHOUT CONTRAST TECHNIQUE: Multidetector CT imaging of the head and cervical spine was performed following the standard protocol without intravenous contrast. Multiplanar CT image reconstructions of the cervical spine were also generated. COMPARISON:  None. FINDINGS: CT HEAD FINDINGS Brain: Prominent cisterna magna or posterior fossa arachnoid cyst. There is no other intra or extra-axial fluid collection or mass lesion. The basilar cisterns and ventricles have a normal appearance. There is no CT evidence for acute infarction or hemorrhage. Vascular: No hyperdense vessel or unexpected calcification. Skull: Normal. Negative for fracture or focal lesion. Sinuses/Orbits:  Air-fluid levels within the LEFT maxillary and sphenoid sinuses. Significant mucosal thickening of the paranasal sinuses. Mastoid air cells are normally aerated. Orbits are unremarkable. No acute sinus wall fractures identified. Other: None. CT CERVICAL SPINE FINDINGS Alignment: Normal. Skull base and vertebrae: No acute fracture. No primary bone lesion or focal pathologic process. Soft tissues and spinal canal: No prevertebral fluid or swelling. No visible canal hematoma. Disc levels:  Unremarkable. Upper chest: Partially imaged ground-glass density in the posterior LEFT UPPER lobe. Other: None IMPRESSION: 1. No evidence for acute intracranial abnormality. 2. Prominent cisterna magna or posterior fossa arachnoid cyst. 3. Acute and chronic sinusitis. 4. No evidence for acute cervical spine abnormality. 5. Partially imaged ground-glass density in the posterior LEFT UPPER lobe. Consider further evaluation with chest x-ray. Electronically Signed   By: Nolon Nations M.D.   On: 01/16/2019 12:59   Dg Chest Port 1 View  Result Date: 01/16/2019 CLINICAL DATA:  Patient found unresponsive this morning. EXAM: PORTABLE CHEST 1 VIEW COMPARISON:  Single-view of the chest 03/22/2017. PA and lateral chest 11/30/2014. FINDINGS: Lung volumes are slightly lower than on the comparison exams with mild crowding of the bronchovascular structures. Lungs are clear without consolidative process, pneumothorax or pleural effusion. Heart size is normal. No acute or focal bony abnormality. IMPRESSION: Negative chest. Electronically Signed  By: Inge Rise M.D.   On: 01/16/2019 11:46    EKG:  EKG: unchanged from previous tracings, sinus tachycardia. Vent. rate 103 BPM PR interval * ms QRS duration 81 ms QT/QTc 313/410 ms P-R-T axes 66 48 76 IMPRESSION AND PLAN:   43 y.o. male with past medical history of kidney and pancreas transplant on 03/18/2016 at [facility].  Unfortunately his pancreas transplant failed due to complications  now on pancreas transplant list at [facility], ESRD was on PD prior to kidney transplant, hypertension, Diabetes mellitus on the Medtronic 630 G insulin pump presenting to the ED today with unresponsiveness.  1. Acute encephalopathy-likely metabolic in the setting of hypoglycemia.  Patient found down this morning, unclear cause but found to have glucose of 41.  Also with emesis  and loss of bowel and bladder control concerning for possible seizures - Admit to step down unit - CT chest showing bilateral multilobar pneumonia - CT head negative for acute intracranial abnormality - Check CK - We will obtain follow-up MRI of the brain given patient is still not at baseline - [facility] contacted for transfer given transplant history managed at [facility] and seizures requiring continuous EEG monitoring. currently no beds pending transfer if bed becomes available  2. Bilateral pneumonia - Blood cultures pending - Start empiric abx - IVFs  3. Hypokalemia - Repelet and recheck.  4. Post -ictal state -concerns for possible seizure - EEG pending - MRI brain as above - Seizure precautions - Ativan prn seizure activity - Spoke with [facility] Dr. Leonel Ramsay who recommend loading with IV Keppra, continue Keppra 500 mg twice daily - Neurology consult pending transfer to [place]  5. Diabetes Mellitus Type 2 with complication - Now with episode of hypoglycemia - Hypoglycemic protocol - Recent HgbA1c 7.0. Goal < 7.0 - Hold Medtronic 630 G insulin pump  - CBG monitoring - Holding SSI in the setting of hypoglycemia  6. ESRD s/p kidney and pancreas transplant (03/2016), pancreas failed now on the list at [facility] for second transplant. - Follows with [facility] transplant nephrology - on Tacrolimus need to continue once medication list is updated  Review and restart home meds as appropriate once list is reconciled.  All the records are reviewed and case discussed with ED provider. Management plans discussed with the  patient, family and they are in agreement.  CODE STATUS: FULL  TOTAL TIME TAKING CARE OF THIS PATIENT: 50 minutes.    on 01/16/2019 at 4:57 PM  Rufina Falco, DNP, FNP-BC [facility] Nurse Practitioner Between 7am to 6pm - Pager [phone]  After 6pm go to [email or web address] - password [email or web address] [facility]  Office  [phone]  CC: Primary care physician; Kathrine Haddock, NP

## 2019-01-16 NOTE — ED Notes (Signed)
Pt vomited with nurse in room. Rolled him onto his side. Has strong gag reflux but does not attempt to roll over when vomiting.

## 2019-01-16 NOTE — ED Notes (Signed)
Images powershared to [place]

## 2019-01-16 NOTE — Progress Notes (Signed)
CODE SEPSIS - PHARMACY COMMUNICATION  **Broad Spectrum Antibiotics should be administered within 1 hour of Sepsis diagnosis**  Time Code Sepsis Called/Page Received: 1122  Antibiotics Ordered: vancomycin, cefepime, metronidazole  Time of 1st antibiotic administration: 1155  Additional action taken by pharmacy: None indicated   [place] Resident 01/16/2019  12:24 PM

## 2019-01-16 NOTE — ED Notes (Signed)
Pt had an episode and vomited coffee ground  while the dr was present.

## 2019-01-16 NOTE — Progress Notes (Signed)
Notified bedside nurse of need to draw lactic acid @ 1318.

## 2019-01-16 NOTE — ED Notes (Signed)
Pt's father's phone number is [phone]

## 2019-01-16 NOTE — ED Provider Notes (Addendum)
[facility] Emergency Department Provider Note  ____________________________________________   First MD Initiated Contact with Patient 01/16/19 1115     (approximate)  I have reviewed the triage vital signs and the nursing notes.   HISTORY  Chief Complaint Altered Mental Status    HPI Gabriel TREMBATH Sr. is a 43 y.o. male with type 1 diabetes with insulin pump, kidney transplant who presents with altered mental status.  Girlfriend last heard from patient last night.  He missed church this morning which is abnormal for him.  She went to the house and he did not answer the door.  They called EMS and police.  Patient was found in his bed.  Patient had dried blood on his face.  There was some nonbloody emesis next to him per EMS with some corn in it.  Patient was initially agonal he breathing.  Patient sugar was 41.  Patient was given D10 and repeat sugar was 151.  After correcting the sugar he his breathing became better and he became more alert although still altered.  Unable to get full HPI due to altered mental status.       Past Medical History:  Diagnosis Date   Cataract    bilateral   End stage renal disease ([place])    peritoneal dialysis since 01/2015   Hypertension    Type 1 diabetes [facility])     Patient Active Problem List   Diagnosis Date Noted   Chest pain 03/22/2017   DKA (diabetic ketoacidoses) ([place]) 07/27/2015   Poorly controlled type 1 diabetes mellitus ([place]) 12/05/2014   Diabetes ([place]) 11/14/2014   Renal failure 11/14/2014   Type I diabetes mellitus, uncontrolled ([place]) 11/14/2014   Hypertension associated with chronic kidney disease due to type 1 diabetes mellitus ([place]) 11/14/2014    Past Surgical History:  Procedure Laterality Date   CAPD INSERTION N/A 12/06/2014   Procedure: LAPAROSCOPIC INSERTION CONTINUOUS AMBULATORY PERITONEAL DIALYSIS  (CAPD) CATHETER;  Surgeon: Katha Cabal, MD;  Location: [facility] ORS;  Service:  Vascular;  Laterality: N/A;   CATARACT EXTRACTION W/ INTRAOCULAR LENS IMPLANT Bilateral 2015   CYST REMOVAL [place]      Prior to Admission medications   Medication Sig Start Date End Date Taking? Authorizing Provider  aspirin EC 81 MG tablet Take 1 tablet daily by mouth. 03/21/16   [provider]  guaiFENesin (MUCINEX) 600 MG 12 hr tablet Take 1 tablet (600 mg total) by mouth 2 (two) times daily. Patient not taking: Reported on 03/22/2017 07/28/15   Fritzi Mandes, MD  insulin aspart (NOVOLOG) 100 UNIT/ML injection Inject 3 Units into the skin 3 (three) times daily with meals.     [provider]  insulin aspart (NOVOLOG) 100 UNIT/ML injection Use your insulin pump as before per instructions upt 50 units/day 03/24/17   Fritzi Mandes, MD  Multiple Vitamin (MULTIVITAMIN WITH MINERALS) TABS tablet Take 1 tablet by mouth daily.    [provider]  mycophenolate (MYFORTIC) 180 MG EC tablet Take 2 tablets 2 (two) times daily by mouth. 04/30/16   [provider]  ondansetron (ZOFRAN ODT) 4 MG disintegrating tablet Allow 1-2 tablets to dissolve in your mouth every 8 hours as needed for nausea/vomiting Patient not taking: Reported on 03/22/2017 09/05/15   Hinda Kehr, MD  predniSONE (DELTASONE) 5 MG tablet Take 1 tablet daily by mouth. 05/20/16   [provider]  tacrolimus (PROGRAF) 1 MG  capsule Take 2 capsules 2 (two) times daily by mouth. 11/28/16   [provider]    Allergies Losartan  Family History  Problem Relation Age of Onset   Diabetes Mother     Social History Social History   Tobacco Use   Smoking status: Never Smoker   Smokeless tobacco: Never Used  Substance Use Topics   Alcohol use: No    Alcohol/week: 0.0 standard drinks   Drug use: No      Review of Systems Unable to get full review system due to altered mental  status ____________________________________________   PHYSICAL EXAM:  VITAL SIGNS: Blood pressure (!) 143/89, pulse (!) 108, temperature 97.8 F (36.6 C), temperature source Axillary, resp. rate 14, weight 80.8 kg, SpO2 99 %.  Constitutional: Patient is altered, dried blood around face Eyes: Pupil defect on the left.  Pupil in the right symmetric. Head: Atraumatic. Nose: No congestion/rhinnorhea.  Dried blood.  No septal hematoma Mouth/Throat: Dried blood in the mouth. Neck: No stridor. Trachea Midline. FROM Cardiovascular: Tachycardic, regular rhythm. Grossly normal heart sounds.  Good peripheral circulation. Respiratory: Normal respiratory effort.  No retractions. Lungs CTAB. Gastrointestinal: Soft and nontender. No distention. No abdominal bruits.  Musculoskeletal: No lower extremity tenderness nor edema.  No joint effusions. Neurologic: Eyes open spontaneous, occasional moaning, moving extremities spontaneously Skin:  Skin is warm, dry and intact. No rash noted. Psychiatric: Unable assess due to altered mental status GU: Deferred   ____________________________________________   LABS (all labs ordered are listed, but only abnormal results are displayed)  Labs Reviewed  GLUCOSE, CAPILLARY - Abnormal; Notable for the following components:      Result Value   Glucose-Capillary 129 (*)    All other components within normal limits  CBC WITH DIFFERENTIAL/PLATELET - Abnormal; Notable for the following components:   WBC 13.8 (*)    Neutro Abs 11.2 (*)    Lymphs Abs 0.4 (*)    Monocytes Absolute 2.0 (*)    Abs Immature Granulocytes 0.08 (*)    All other components within normal limits  COMPREHENSIVE METABOLIC PANEL - Abnormal; Notable for the following components:   Potassium 3.1 (*)    Glucose, Bld 134 (*)    BUN 22 (*)    Total Bilirubin 1.4 (*)    All other components within normal limits  LACTIC ACID, PLASMA - Abnormal; Notable for the following components:   Lactic  Acid, Venous 2.1 (*)    All other components within normal limits  URINE CULTURE  CULTURE, BLOOD (ROUTINE X 2)  CULTURE, BLOOD (ROUTINE X 2)  SARS CORONAVIRUS 2 (HOSPITAL ORDER, [place] [facility])  LIPASE, BLOOD  PROTIME-INR  APTT  GLUCOSE, CAPILLARY  GLUCOSE, CAPILLARY  GLUCOSE, CAPILLARY  URINALYSIS, ROUTINE W REFLEX MICROSCOPIC  LACTIC ACID, PLASMA  URINE DRUG SCREEN, QUALITATIVE ([facility] ONLY)  SALICYLATE LEVEL  ACETAMINOPHEN LEVEL  ETHANOL  CBG MONITORING, ED  CBG MONITORING, ED  CBG MONITORING, ED  TYPE AND SCREEN   ____________________________________________   ED ECG REPORT I, Vanessa [place], the attending physician, personally viewed and interpreted this ECG.  EKG is sinus tachycardia rate of 103, no ST elevation, no T wave inversion, normal intervals ____________________________________________  RADIOLOGY Robert Bellow, personally viewed and evaluated these images (plain radiographs) as part of my medical decision making, as well as reviewing the written report by the radiologist.  ED MD interpretation: Chest x-ray negative for aspiration.  Official radiology report(s): Ct Head Wo Contrast  Result Date:  01/16/2019 CLINICAL DATA:  PT found down. Unknown down time.  Pt with hx of DM EXAM: CT HEAD WITHOUT CONTRAST CT CERVICAL SPINE WITHOUT CONTRAST TECHNIQUE: Multidetector CT imaging of the head and cervical spine was performed following the standard protocol without intravenous contrast. Multiplanar CT image reconstructions of the cervical spine were also generated. COMPARISON:  None. FINDINGS: CT HEAD FINDINGS Brain: Prominent cisterna magna or posterior fossa arachnoid cyst. There is no other intra or extra-axial fluid collection or mass lesion. The basilar cisterns and ventricles have a normal appearance. There is no CT evidence for acute infarction or hemorrhage. Vascular: No hyperdense vessel or unexpected calcification. Skull: Normal. Negative for  fracture or focal lesion. Sinuses/Orbits: Air-fluid levels within the LEFT maxillary and sphenoid sinuses. Significant mucosal thickening of the paranasal sinuses. Mastoid air cells are normally aerated. Orbits are unremarkable. No acute sinus wall fractures identified. Other: None. CT CERVICAL SPINE FINDINGS Alignment: Normal. Skull base and vertebrae: No acute fracture. No primary bone lesion or focal pathologic process. Soft tissues and spinal canal: No prevertebral fluid or swelling. No visible canal hematoma. Disc levels:  Unremarkable. Upper chest: Partially imaged ground-glass density in the posterior LEFT UPPER lobe. Other: None IMPRESSION: 1. No evidence for acute intracranial abnormality. 2. Prominent cisterna magna or posterior fossa arachnoid cyst. 3. Acute and chronic sinusitis. 4. No evidence for acute cervical spine abnormality. 5. Partially imaged ground-glass density in the posterior LEFT UPPER lobe. Consider further evaluation with chest x-ray. Electronically Signed   By: Nolon Nations M.D.   On: 01/16/2019 12:59   Ct Cervical Spine Wo Contrast  Result Date: 01/16/2019 CLINICAL DATA:  PT found down. Unknown down time.  Pt with hx of DM EXAM: CT HEAD WITHOUT CONTRAST CT CERVICAL SPINE WITHOUT CONTRAST TECHNIQUE: Multidetector CT imaging of the head and cervical spine was performed following the standard protocol without intravenous contrast. Multiplanar CT image reconstructions of the cervical spine were also generated. COMPARISON:  None. FINDINGS: CT HEAD FINDINGS Brain: Prominent cisterna magna or posterior fossa arachnoid cyst. There is no other intra or extra-axial fluid collection or mass lesion. The basilar cisterns and ventricles have a normal appearance. There is no CT evidence for acute infarction or hemorrhage. Vascular: No hyperdense vessel or unexpected calcification. Skull: Normal. Negative for fracture or focal lesion. Sinuses/Orbits: Air-fluid levels within the LEFT maxillary  and sphenoid sinuses. Significant mucosal thickening of the paranasal sinuses. Mastoid air cells are normally aerated. Orbits are unremarkable. No acute sinus wall fractures identified. Other: None. CT CERVICAL SPINE FINDINGS Alignment: Normal. Skull base and vertebrae: No acute fracture. No primary bone lesion or focal pathologic process. Soft tissues and spinal canal: No prevertebral fluid or swelling. No visible canal hematoma. Disc levels:  Unremarkable. Upper chest: Partially imaged ground-glass density in the posterior LEFT UPPER lobe. Other: None IMPRESSION: 1. No evidence for acute intracranial abnormality. 2. Prominent cisterna magna or posterior fossa arachnoid cyst. 3. Acute and chronic sinusitis. 4. No evidence for acute cervical spine abnormality. 5. Partially imaged ground-glass density in the posterior LEFT UPPER lobe. Consider further evaluation with chest x-ray. Electronically Signed   By: Nolon Nations M.D.   On: 01/16/2019 12:59   Dg Chest Port 1 View  Result Date: 01/16/2019 CLINICAL DATA:  Patient found unresponsive this morning. EXAM: PORTABLE CHEST 1 VIEW COMPARISON:  Single-view of the chest 03/22/2017. PA and lateral chest 11/30/2014. FINDINGS: Lung volumes are slightly lower than on the comparison exams with mild crowding of the bronchovascular structures. Lungs are  clear without consolidative process, pneumothorax or pleural effusion. Heart size is normal. No acute or focal bony abnormality. IMPRESSION: Negative chest. Electronically Signed   By: Inge Rise M.D.   On: 01/16/2019 11:46    ____________________________________________   PROCEDURES  Procedure(s) performed (including Critical Care):  .Critical Care Performed by: Vanessa [place], MD Authorized by: Vanessa [place], MD   Critical care provider statement:    Critical care time (minutes):  45   Critical care was necessary to treat or prevent imminent or life-threatening deterioration of the following  conditions:  Endocrine crisis and sepsis   Critical care was time spent personally by me on the following activities:  Discussions with consultants, evaluation of patient's response to treatment, examination of patient, ordering and performing treatments and interventions, ordering and review of laboratory studies, ordering and review of radiographic studies, pulse oximetry, re-evaluation of patient's condition, obtaining history from patient or surrogate and review of old charts     ____________________________________________   INITIAL IMPRESSION / [place] / ED COURSE  Cordera Borsellino Brundage Sr. was evaluated in Emergency Department on 01/16/2019 for the symptoms described in the history of present illness. He was evaluated in the context of the global COVID-19 pandemic, which necessitated consideration that the patient might be at risk for infection with the SARS-CoV-2 virus that causes COVID-19. Institutional protocols and algorithms that pertain to the evaluation of patients at risk for COVID-19 are in a state of rapid change based on information released by regulatory bodies including the CDC and federal and state organizations. These policies and algorithms were followed during the patient's care in the ED.    Patient is a 43 year old who presents altered with hypoglycemia.  Potential GI bleeding although patient does have brown stool in his boxers.  Also consider patient having a seizure due to hypoglycemia and biting his tongue which could have cause the blood on face ?[name]  Given patient is a kidney transplant and may not spike a fever will cover patient broadly with antibiotics at this time.  Will get labs to evaluate for UTI, pneumonia, aspiration.  Will get CT head to evaluate for epidural/subdural hematoma.  Consider CTA to rule out dissection given new seizure but patient MAEW and given renal transplant will need kidney function. We will continue to closely monitor.  We did remove  patient's insulin monitor.  CT imaging negative.  X-ray negative.  Patient covered prophylactically with antibiotics and white count is 13.8 which puts patient in the sirs criteria.  Initial lactate 2.1.  Patient started on D10 due to his sugars slowly dropping.  Continue to closely monitor.  Re-Assessed patient.  He is still pretty altered.  Unclear exactly why.  Patient is spontaneously moving all extremities however in looking around although just not forming any sentences.  We are covered for meningitis with his antibiotics versus other infection such as UTI.  Do not feel like a LP is warranted at this time given patient is afebrile and patient would be moving around too much to obtain without sedation and he is a high aspiration risk if we sedate him.  Versus  post ictal from a possible seizure.  Will add on drug screen.  I did discuss with patient's father who said that he had had no infectious symptoms yesterday.    Discussed with the hospital team for admission given his hypoglycemia, altered mental status.  Admit to the hospital D/w hospital given pt is still altered to consider MRI to  rule out stroke vs MRA to rule out dissection.   3:09 PM patient now requesting transfer to [place] due to him having a transplant kidney in December 2018.  [facility] has no beds available.  Pt put on waitlist but will be admitted here.     ____________________________________________   FINAL CLINICAL IMPRESSION(S) / ED DIAGNOSES   Final diagnoses:  Altered mental status, unspecified altered mental status type  Sepsis, due to unspecified organism, unspecified whether acute organ dysfunction present ([place])  Hypoglycemia      MEDICATIONS GIVEN DURING THIS VISIT:  Medications  vancomycin (VANCOCIN) 1,500 mg in sodium chloride 0.9 % 500 mL IVPB (1,500 mg Intravenous New Bag/Given 01/16/19 1415)  dextrose 10 % infusion ( Intravenous New Bag/Given 01/16/19 1200)  dextrose 50 % solution (has no  administration in time range)  ceFEPIme (MAXIPIME) 2 g in sodium chloride 0.9 % 100 mL IVPB (0 g Intravenous Stopped 01/16/19 1404)  metroNIDAZOLE (FLAGYL) IVPB 500 mg (0 mg Intravenous Stopped 01/16/19 1405)  sodium chloride 0.9 % bolus 1,000 mL (1,000 mLs Intravenous New Bag/Given 01/16/19 1152)     ED Discharge Orders    None       Note:  This document was prepared using Dragon voice recognition software and may include unintentional dictation errors.   Vanessa [place], MD 01/16/19 1419    Vanessa [place], MD 01/16/19 [phone]

## 2019-01-16 NOTE — ED Notes (Signed)
Called [facility] spoke to [place] about transfer  1515

## 2019-01-16 NOTE — Progress Notes (Signed)
Notified bedside nurse of need to draw lactic acid.  

## 2019-01-16 NOTE — ED Notes (Signed)
Condom catheter placed

## 2019-01-16 NOTE — ED Notes (Signed)
ED TO INPATIENT HANDOFF REPORT  ED Nurse Name and Phone #: Azan Maneri [phone]  S Name/Age/Gender Gabriel Beams Cage Sr. 43 y.o. male Room/Bed: ED16A/ED16A  Code Status   Code Status: Full Code  Home/SNF/Other Home Patient oriented to: none Is this baseline? No   Triage Complete: Triage complete  Chief Complaint unresp  Triage Note No notes on file   Allergies Allergies  Allergen Reactions  . Losartan Swelling and Other (See Comments)    Reaction:  Feet/ankle swelling   . Oxycodone Nausea Only    Level of Care/Admitting Diagnosis ED Disposition    ED Disposition Condition [facility]: [place] [[id number]]  Level of Care: Stepdown [14]  Covid Evaluation: Asymptomatic Screening Protocol (No Symptoms)  Diagnosis: Acute encephalopathy [id number]  Admitting Physician: Eula Flax  Attending Physician: Rufina Falco ACHIENG [id number]  Estimated length of stay: past midnight tomorrow  Certification:: I certify this patient will need inpatient services for at least 2 midnights  PT Class (Do Not Modify): Inpatient [101]  PT Acc Code (Do Not Modify): Private [1]       B Medical/Surgery History Past Medical History:  Diagnosis Date  . Cataract    bilateral  . End stage renal disease ([place])    peritoneal dialysis since 01/2015  . Hypertension   . Type 1 diabetes [facility])    Past Surgical History:  Procedure Laterality Date  . CAPD INSERTION N/A 12/06/2014   Procedure: LAPAROSCOPIC INSERTION CONTINUOUS AMBULATORY PERITONEAL DIALYSIS  (CAPD) CATHETER;  Surgeon: Katha Cabal, MD;  Location: [facility] ORS;  Service: Vascular;  Laterality: N/A;  . CATARACT EXTRACTION W/ INTRAOCULAR LENS IMPLANT Bilateral 2015  . CYST REMOVAL TRUNK    . GYNECOMASTIA EXCISION  1993  . KIDNEY TRANSPLANT       A IV Location/Drains/Wounds Patient Lines/Drains/Airways Status   Active Line/Drains/Airways    Name:   Placement date:    Placement time:   Site:   Days:   Peripheral IV 01/16/19 Anterior;Distal;Right;Upper Forearm   01/16/19    1114    Forearm   less than 1   Peripheral IV 01/16/19 Left Antecubital   01/16/19    1140    Antecubital   less than 1   Urethral Catheter tom Non-latex 16 Fr.   01/16/19    1939    Non-latex   less than 1   Incision (Closed) 12/06/14 Abdomen   12/06/14    1412     1502          Intake/Output Last 24 hours No intake or output data in the 24 hours ending 01/16/19 2114  Labs/Imaging Results for orders placed or performed during the hospital encounter of 01/16/19 (from the past 48 hour(s))  Glucose, capillary     Status: Abnormal   Collection Time: 01/16/19 11:14 AM  Result Value Ref Range   Glucose-Capillary 129 (H) 70 - 99 mg/dL  CBC with Differential     Status: Abnormal   Collection Time: 01/16/19 11:17 AM  Result Value Ref Range   WBC 13.8 (H) 4.0 - 10.5 K/uL   RBC 4.34 4.22 - 5.81 MIL/uL   Hemoglobin 13.3 13.0 - 17.0 g/dL   HCT 39.7 39.0 - 52.0 %   MCV 91.5 80.0 - 100.0 fL   MCH 30.6 26.0 - 34.0 pg   MCHC 33.5 30.0 - 36.0 g/dL   RDW 13.4 11.5 - 15.5 %   Platelets 206 150 - 400 K/uL  nRBC 0.0 0.0 - 0.2 %   Neutrophils Relative % 81 %   Neutro Abs 11.2 (H) 1.7 - 7.7 K/uL   Lymphocytes Relative 3 %   Lymphs Abs 0.4 (L) 0.7 - 4.0 K/uL   Monocytes Relative 15 %   Monocytes Absolute 2.0 (H) 0.1 - 1.0 K/uL   Eosinophils Relative 0 %   Eosinophils Absolute 0.0 0.0 - 0.5 K/uL   Basophils Relative 0 %   Basophils Absolute 0.1 0.0 - 0.1 K/uL   Immature Granulocytes 1 %   Abs Immature Granulocytes 0.08 (H) 0.00 - 0.07 K/uL    Comment: Performed at [facility], [address] [place], [place] [address]  Comprehensive metabolic panel     Status: Abnormal   Collection Time: 01/16/19 11:17 AM  Result Value Ref Range   Sodium 142 135 - 145 mmol/L   Potassium 3.1 (L) 3.5 - 5.1 mmol/L   Chloride 107 98 - 111 mmol/L   CO2 25 22 - 32 mmol/L   Glucose, Bld 134 (H)  70 - 99 mg/dL   BUN 22 (H) 6 - 20 mg/dL   Creatinine, Ser 1.21 0.61 - 1.24 mg/dL   Calcium 9.5 8.9 - 10.3 mg/dL   Total Protein 6.8 6.5 - 8.1 g/dL   Albumin 4.2 3.5 - 5.0 g/dL   AST 31 15 - 41 U/L   ALT 40 0 - 44 U/L   Alkaline Phosphatase 68 38 - 126 U/L   Total Bilirubin 1.4 (H) 0.3 - 1.2 mg/dL   GFR calc non Af Amer >60 >60 mL/min   GFR calc Af Amer >60 >60 mL/min   Anion gap 10 5 - 15    Comment: Performed at [facility], [address] [place], [place] [address]  Lipase, blood     Status: None   Collection Time: 01/16/19 11:17 AM  Result Value Ref Range   Lipase 14 11 - 51 U/L    Comment: Performed at [facility], [address] [place], [place] [address]  Protime-INR     Status: None   Collection Time: 01/16/19 11:17 AM  Result Value Ref Range   Prothrombin Time 13.1 11.4 - 15.2 seconds   INR 1.0 0.8 - 1.2    Comment: (NOTE) INR goal varies based on device and disease states. Performed at [facility], [address] [place], [place] [address]   APTT     Status: None   Collection Time: 01/16/19 11:17 AM  Result Value Ref Range   aPTT 24 24 - 36 seconds    Comment: Performed at [facility], [address] [place], [place] [address]  Lactic acid, plasma     Status: Abnormal   Collection Time: 01/16/19 11:17 AM  Result Value Ref Range   Lactic Acid, Venous 2.1 (HH) 0.5 - 1.9 mmol/L    Comment: CRITICAL RESULT CALLED TO, READ BACK BY AND VERIFIED WITH Mindy Gali @1148  ON 01/16/2019 BY FMW Performed at [facility], [address] [place], [place] [id number]   Salicylate level     Status: None   Collection Time: 01/16/19 11:17 AM  Result Value Ref Range   Salicylate Lvl [id number] 2.8 - 30.0 mg/dL    Comment: Performed at [facility], [address] [place], [place] [address]  Acetaminophen level     Status: Abnormal   Collection Time: 01/16/19 11:17 AM  Result Value Ref Range   Acetaminophen (Tylenol),  Serum <10 (L) 10 -  30 ug/mL    Comment: (NOTE) Therapeutic concentrations vary significantly. A range of 10-30 ug/mL  may be an effective concentration for many patients. However, some  are best treated at concentrations outside of this range. Acetaminophen concentrations >150 ug/mL at 4 hours after ingestion  and >50 ug/mL at 12 hours after ingestion are often associated with  toxic reactions. Performed at [facility], [address] [place], [place] [address]   Ethanol     Status: None   Collection Time: 01/16/19 11:17 AM  Result Value Ref Range   Alcohol, Ethyl (B) <10 <10 mg/dL    Comment: (NOTE) Lowest detectable limit for serum alcohol is 10 mg/dL. For medical purposes only. Performed at [facility], [address] [place], [place] [address]   ABO/Rh     Status: None   Collection Time: 01/16/19 11:17 AM  Result Value Ref Range   ABO/RH(D)      O POS Performed at [facility], [address] [place], [place] [address]   Glucose, capillary     Status: None   Collection Time: 01/16/19 11:34 AM  Result Value Ref Range   Glucose-Capillary 90 70 - 99 mg/dL  Lactic acid, plasma     Status: None   Collection Time: 01/16/19 12:16 PM  Result Value Ref Range   Lactic Acid, Venous 1.1 0.5 - 1.9 mmol/L    Comment: Performed at [facility], [address], [place],  [address]  SARS Coronavirus 2 [facility] order, Performed in [facility] hospital lab) Nasopharyngeal Nasopharyngeal Swab     Status: None   Collection Time: 01/16/19 12:16 PM   Specimen: Nasopharyngeal Swab  Result Value Ref Range   SARS Coronavirus 2 NEGATIVE NEGATIVE    Comment: (NOTE) If result is NEGATIVE SARS-CoV-2 target nucleic acids are NOT DETECTED. The SARS-CoV-2 RNA is generally detectable in upper and lower  respiratory specimens during the acute phase of infection. The lowest  concentration of SARS-CoV-2 viral copies this assay can detect is 250   copies / mL. A negative result does not preclude SARS-CoV-2 infection  and should not be used as the sole basis for treatment or other  patient management decisions.  A negative result may occur with  improper specimen collection / handling, submission of specimen other  than nasopharyngeal swab, presence of viral mutation(s) within the  areas targeted by this assay, and inadequate number of viral copies  (<250 copies / mL). A negative result must be combined with clinical  observations, patient history, and epidemiological information. If result is POSITIVE SARS-CoV-2 target nucleic acids are DETECTED. The SARS-CoV-2 RNA is generally detectable in upper and lower  respiratory specimens dur ing the acute phase of infection.  Positive  results are indicative of active infection with SARS-CoV-2.  Clinical  correlation with patient history and other diagnostic information is  necessary to determine patient infection status.  Positive results do  not rule out bacterial infection or co-infection with other viruses. If result is PRESUMPTIVE POSTIVE SARS-CoV-2 nucleic acids MAY BE PRESENT.   A presumptive positive result was obtained on the submitted specimen  and confirmed on repeat testing.  While 2019 novel coronavirus  (SARS-CoV-2) nucleic acids may be present in the submitted sample  additional confirmatory testing may be necessary for epidemiological  and / or clinical management purposes  to differentiate between  SARS-CoV-2 and other Sarbecovirus currently known to infect humans.  If clinically indicated additional testing with an alternate test  methodology (  [id number]) is advised. The SARS-CoV-2 RNA is generally  detectable in upper and lower respiratory sp ecimens during the acute  phase of infection. The expected result is Negative. Fact Sheet for Patients:  StrictlyIdeas.no Fact Sheet for Healthcare  Providers: BankingDealers.co.za This test is not yet approved or cleared by the [place] FDA and has been authorized for detection and/or diagnosis of SARS-CoV-2 by FDA under an Emergency Use Authorization (EUA).  This EUA will remain in effect (meaning this test can be used) for the duration of the COVID-19 declaration under Section 564(b)(1) of the Act, 21 U.S.C. section 360bbb-3(b)(1), unless the authorization is terminated or revoked sooner. Performed at [facility], [address] [place], [place] [address]   Glucose, capillary     Status: None   Collection Time: 01/16/19 12:20 PM  Result Value Ref Range   Glucose-Capillary 87 70 - 99 mg/dL  Glucose, capillary     Status: None   Collection Time: 01/16/19  1:01 PM  Result Value Ref Range   Glucose-Capillary 83 70 - 99 mg/dL  Glucose, capillary     Status: Abnormal   Collection Time: 01/16/19  2:18 PM  Result Value Ref Range   Glucose-Capillary 125 (H) 70 - 99 mg/dL  Glucose, capillary     Status: Abnormal   Collection Time: 01/16/19  5:30 PM  Result Value Ref Range   Glucose-Capillary 321 (H) 70 - 99 mg/dL  Prepare RBC     Status: None   Collection Time: 01/16/19  5:46 PM  Result Value Ref Range   Order Confirmation      ORDER PROCESSED BY BLOOD BANK Performed at [facility], [address] [place], [place] [id number]   Basic metabolic panel     Status: Abnormal   Collection Time: 01/16/19  6:46 PM  Result Value Ref Range   Sodium 137 135 - 145 mmol/L   Potassium 4.6 3.5 - 5.1 mmol/L   Chloride 105 98 - 111 mmol/L   CO2 22 22 - 32 mmol/L   Glucose, Bld 316 (H) 70 - 99 mg/dL   BUN 20 6 - 20 mg/dL   Creatinine, Ser 1.16 0.61 - 1.24 mg/dL   Calcium 8.5 (L) 8.9 - 10.3 mg/dL   GFR calc non Af Amer >60 >60 mL/min   GFR calc Af Amer >60 >60 mL/min   Anion gap 10 5 - 15    Comment: Performed at [facility], [address] [place], [place] [address]  CBC with  Differential     Status: Abnormal   Collection Time: 01/16/19  6:46 PM  Result Value Ref Range   WBC 17.3 (H) 4.0 - 10.5 K/uL   RBC 4.02 (L) 4.22 - 5.81 MIL/uL   Hemoglobin 12.2 (L) 13.0 - 17.0 g/dL   HCT 36.7 (L) 39.0 - 52.0 %   MCV 91.3 80.0 - 100.0 fL   MCH 30.3 26.0 - 34.0 pg   MCHC 33.2 30.0 - 36.0 g/dL   RDW 13.3 11.5 - 15.5 %   Platelets 203 150 - 400 K/uL   nRBC 0.0 0.0 - 0.2 %   Neutrophils Relative % 83 %   Neutro Abs 14.2 (H) 1.7 - 7.7 K/uL   Lymphocytes Relative 5 %   Lymphs Abs 0.9 0.7 - 4.0 K/uL   Monocytes Relative 12 %   Monocytes Absolute 2.1 (H) 0.1 - 1.0 K/uL   Eosinophils Relative 0 %   Eosinophils Absolute 0.0 0.0 - 0.5 K/uL  Basophils Relative 0 %   Basophils Absolute 0.1 0.0 - 0.1 K/uL   Immature Granulocytes 0 %   Abs Immature Granulocytes 0.07 0.00 - 0.07 K/uL    Comment: Performed at [facility], [address] [place], [place] [address]  Type and screen     Status: None (Preliminary result)   Collection Time: 01/16/19  6:47 PM  Result Value Ref Range   ABO/RH(D) O POS    Antibody Screen NEG    Sample Expiration 01/19/2019,2359    Unit Number [id number]    Blood Component Type RBC LR PHER1    Unit division 00    Status of Unit ALLOCATED    Transfusion Status OK TO TRANSFUSE    Crossmatch Result      Compatible Performed at [facility], [address] [place], [place] [address]    Unit Number [id number]    Blood Component Type RED CELLS,LR    Unit division 00    Status of Unit ALLOCATED    Transfusion Status OK TO TRANSFUSE    Crossmatch Result Compatible   Urinalysis, Routine w reflex microscopic     Status: Abnormal   Collection Time: 01/16/19  7:41 PM  Result Value Ref Range   Color, Urine YELLOW (A) YELLOW   APPearance CLEAR (A) CLEAR   Specific Gravity, Urine 1.026 1.005 - 1.030   pH 6.0 5.0 - 8.0   Glucose, UA >=500 (A) NEGATIVE mg/dL   Hgb urine dipstick NEGATIVE NEGATIVE   Bilirubin Urine NEGATIVE  NEGATIVE   Ketones, ur 20 (A) NEGATIVE mg/dL   Protein, ur NEGATIVE NEGATIVE mg/dL   Nitrite NEGATIVE NEGATIVE   Leukocytes,Ua NEGATIVE NEGATIVE   RBC / HPF 0-5 0 - 5 RBC/hpf   WBC, UA 0-5 0 - 5 WBC/hpf   Bacteria, UA NONE SEEN NONE SEEN   Squamous Epithelial / LPF NONE SEEN 0 - 5   Mucus PRESENT     Comment: Performed at [facility], [address], [place], [place] [address]  Urine Drug Screen, Qualitative ([facility] only)     Status: None   Collection Time: 01/16/19  7:41 PM  Result Value Ref Range   Tricyclic, Ur Screen NONE DETECTED NONE DETECTED   Amphetamines, Ur Screen NONE DETECTED NONE DETECTED   MDMA (Ecstasy)Ur Screen NONE DETECTED NONE DETECTED   Cocaine Metabolite,Ur [place] NONE DETECTED NONE DETECTED   Opiate, Ur Screen NONE DETECTED NONE DETECTED   Phencyclidine (PCP) Ur S NONE DETECTED NONE DETECTED   Cannabinoid 50 Ng, Ur [place] NONE DETECTED NONE DETECTED   Barbiturates, Ur Screen NONE DETECTED NONE DETECTED   Benzodiazepine, Ur Scrn NONE DETECTED NONE DETECTED   Methadone Scn, Ur NONE DETECTED NONE DETECTED    Comment: (NOTE) Tricyclics + metabolites, urine    Cutoff 1000 ng/mL Amphetamines + metabolites, urine  Cutoff 1000 ng/mL MDMA (Ecstasy), urine              Cutoff 500 ng/mL Cocaine Metabolite, urine          Cutoff 300 ng/mL Opiate + metabolites, urine        Cutoff 300 ng/mL Phencyclidine (PCP), urine         Cutoff 25 ng/mL Cannabinoid, urine                 Cutoff 50 ng/mL Barbiturates + metabolites, urine  Cutoff 200 ng/mL Benzodiazepine, urine              Cutoff 200 ng/mL Methadone,  urine                   Cutoff 300 ng/mL The urine drug screen provides only a preliminary, unconfirmed analytical test result and should not be used for non-medical purposes. Clinical consideration and professional judgment should be applied to any positive drug screen result due to possible interfering substances. A more specific alternate chemical method must be  used in order to obtain a confirmed analytical result. Gas chromatography / mass spectrometry (GC/MS) is the preferred confirmat ory method. Performed at [facility], [address] [place], [place] [address]   Glucose, capillary     Status: Abnormal   Collection Time: 01/16/19  9:10 PM  Result Value Ref Range   Glucose-Capillary 306 (H) 70 - 99 mg/dL   Ct Head Wo Contrast  Result Date: 01/16/2019 CLINICAL DATA:  PT found down. Unknown down time.  Pt with hx of DM EXAM: CT HEAD WITHOUT CONTRAST CT CERVICAL SPINE WITHOUT CONTRAST TECHNIQUE: Multidetector CT imaging of the head and cervical spine was performed following the standard protocol without intravenous contrast. Multiplanar CT image reconstructions of the cervical spine were also generated. COMPARISON:  None. FINDINGS: CT HEAD FINDINGS Brain: Prominent cisterna magna or posterior fossa arachnoid cyst. There is no other intra or extra-axial fluid collection or mass lesion. The basilar cisterns and ventricles have a normal appearance. There is no CT evidence for acute infarction or hemorrhage. Vascular: No hyperdense vessel or unexpected calcification. Skull: Normal. Negative for fracture or focal lesion. Sinuses/Orbits: Air-fluid levels within the LEFT maxillary and sphenoid sinuses. Significant mucosal thickening of the paranasal sinuses. Mastoid air cells are normally aerated. Orbits are unremarkable. No acute sinus wall fractures identified. Other: None. CT CERVICAL SPINE FINDINGS Alignment: Normal. Skull base and vertebrae: No acute fracture. No primary bone lesion or focal pathologic process. Soft tissues and spinal canal: No prevertebral fluid or swelling. No visible canal hematoma. Disc levels:  Unremarkable. Upper chest: Partially imaged ground-glass density in the posterior LEFT UPPER lobe. Other: None IMPRESSION: 1. No evidence for acute intracranial abnormality. 2. Prominent cisterna magna or posterior fossa arachnoid cyst.  3. Acute and chronic sinusitis. 4. No evidence for acute cervical spine abnormality. 5. Partially imaged ground-glass density in the posterior LEFT UPPER lobe. Consider further evaluation with chest x-ray. Electronically Signed   By: Nolon Nations M.D.   On: 01/16/2019 12:59   Ct Chest W Contrast  Result Date: 01/16/2019 CLINICAL DATA:  Patient found down. Ground-glass opacity in the posterior left upper lobe on a cervical spine CT obtained earlier today. Hematemesis. History of kidney transplant. EXAM: CT CHEST, ABDOMEN, AND PELVIS WITH CONTRAST TECHNIQUE: Multidetector CT imaging of the chest, abdomen and pelvis was performed following the standard protocol during bolus administration of intravenous contrast. CONTRAST:  143mL OMNIPAQUE IOHEXOL 300 MG/ML  SOLN COMPARISON:  Cervical spine CT and chest radiograph obtained earlier today. FINDINGS: CT CHEST FINDINGS Cardiovascular: Atheromatous coronary artery calcification. Normal sized heart. Minimal pericardial effusion inferiorly with a maximum thickness of 12 mm. Mediastinum/Nodes: No enlarged mediastinal, hilar, or axillary lymph nodes. Thyroid gland, trachea, and esophagus demonstrate no significant findings. Lungs/Pleura: Large number of small, patchy alveolar opacities in the left upper lobe, left lower lobe and right lower lobe. This has a somewhat nodular pattern and is most confluent in the posterolateral aspect of the left upper lobe. No pleural fluid. Musculoskeletal: Unremarkable bones. CT ABDOMEN PELVIS FINDINGS Hepatobiliary: Anterior right lobe liver cyst. Normal appearing gallbladder. Pancreas: Marked diffuse  pancreatic atrophy. Spleen: Normal in size without focal abnormality. Adrenals/Urinary Tract: Mild to moderate bilateral renal atrophy and vascular calcifications. Normal appearing left pelvic transplant kidney. Unremarkable urinary bladder and ureters. Stomach/Bowel: Mildly dilated stomach. Mild and moderate dilatation of multiple loops  of jejunum. Normal caliber distal small bowel. Rectum and distal sigmoid colon distended by stool. Moderate amount of stool elsewhere in the colon. Vascular/Lymphatic: No significant vascular findings are present. No enlarged abdominal or pelvic lymph nodes. Reproductive: Prostate is unremarkable. Other: Oval soft tissue and lower density surrounding a curvilinear calcific density in the mesentery between bowel loops in the left mid abdomen, anteriorly. This measures 2.4 x 1.4 cm on axial image number 79 series 2 and 1.9 cm in length on coronal image number 37. This is also seen on sagittal image number 102. There is an additional curvilinear density slightly more superiorly in the midline. The patient had a peritoneal dialysis catheter placed on 12/06/2014, not currently demonstrated. Musculoskeletal: Unremarkable bones. IMPRESSION: 1. Bilateral multilobar pneumonia with a nodular configuration, involving the left upper lobe, left lower lobe and right lower lobe. This has an appearance suggesting infection with an atypical organism. 2. 2.4 x 1.4 x 1.9 cm curvilinear soft tissue and lower density surrounding a curvilinear calcific density in the mesentery in the left mid abdomen. This may represent a peritoneal dialysis catheter fragment with surrounding foreign body reaction. A primary neoplasm such as a carcinoid tumor is less likely. There is an additional nearby possible catheter fragment. 3. Mild and moderate dilatation of multiple loops of jejunum. This could be due to ileus or partial obstruction 4. Moderate amount of stool in the colon with more pronounced stool distending the rectum and distal sigmoid colon. 5. Atheromatous coronary artery calcifications. 6. Minimal pericardial effusion inferiorly. 7. Marked diffuse pancreatic atrophy. 8. Normal appearing left pelvic transplant kidney. 9. Mild to moderate bilateral renal atrophy. Electronically Signed   By: Claudie Revering M.D.   On: 01/16/2019 18:47   Ct  Cervical Spine Wo Contrast  Result Date: 01/16/2019 CLINICAL DATA:  PT found down. Unknown down time.  Pt with hx of DM EXAM: CT HEAD WITHOUT CONTRAST CT CERVICAL SPINE WITHOUT CONTRAST TECHNIQUE: Multidetector CT imaging of the head and cervical spine was performed following the standard protocol without intravenous contrast. Multiplanar CT image reconstructions of the cervical spine were also generated. COMPARISON:  None. FINDINGS: CT HEAD FINDINGS Brain: Prominent cisterna magna or posterior fossa arachnoid cyst. There is no other intra or extra-axial fluid collection or mass lesion. The basilar cisterns and ventricles have a normal appearance. There is no CT evidence for acute infarction or hemorrhage. Vascular: No hyperdense vessel or unexpected calcification. Skull: Normal. Negative for fracture or focal lesion. Sinuses/Orbits: Air-fluid levels within the LEFT maxillary and sphenoid sinuses. Significant mucosal thickening of the paranasal sinuses. Mastoid air cells are normally aerated. Orbits are unremarkable. No acute sinus wall fractures identified. Other: None. CT CERVICAL SPINE FINDINGS Alignment: Normal. Skull base and vertebrae: No acute fracture. No primary bone lesion or focal pathologic process. Soft tissues and spinal canal: No prevertebral fluid or swelling. No visible canal hematoma. Disc levels:  Unremarkable. Upper chest: Partially imaged ground-glass density in the posterior LEFT UPPER lobe. Other: None IMPRESSION: 1. No evidence for acute intracranial abnormality. 2. Prominent cisterna magna or posterior fossa arachnoid cyst. 3. Acute and chronic sinusitis. 4. No evidence for acute cervical spine abnormality. 5. Partially imaged ground-glass density in the posterior LEFT UPPER lobe. Consider further evaluation with chest  x-ray. Electronically Signed   By: Nolon Nations M.D.   On: 01/16/2019 12:59   Mr Brain Wo Contrast  Result Date: 01/16/2019 CLINICAL DATA:  Neuro deficits, subacute.  Personal history of diabetes and kidney transplant with altered mental status. Hypoglycemia. EXAM: MRI HEAD WITHOUT CONTRAST TECHNIQUE: Multiplanar, multiecho pulse sequences of the brain and surrounding structures were obtained without intravenous contrast. COMPARISON:  None. FINDINGS: Brain: Focal restricted diffusion is present in the posterior hippocampus bilaterally. No other significant restricted diffusion is present. T2 signal changes accompany the diffusion changes. No acute hemorrhage or mass lesion is present. No significant white matter lesions are present. The ventricles are of normal size. No significant extraaxial fluid collection is present. The brainstem and cerebellum are within normal limits. Mega cisterna magna is noted. Vascular: Flow is present in the major intracranial arteries. Skull and upper cervical spine: The craniocervical junction is normal. Upper cervical spine is within normal limits. Marrow signal is unremarkable. Sinuses/Orbits: Fluid level is present in the left maxillary sinus. Mild mucosal thickening is present in the right maxillary sinus. There is mild mucosal thickening in the anterior ethmoid air cells bilaterally. Fluid level is present in the right sphenoid sinus. Bilateral lens replacements are noted. Globes and orbits are otherwise unremarkable. IMPRESSION: 1. Restricted diffusion and T2 signal changes within the posterior hippocampus bilaterally, consistent with hypoglycemic encephalopathy. No other focal areas are involved. 2. Otherwise normal MRI appearance the brain. 3. Sinus disease Electronically Signed   By: San Morelle M.D.   On: 01/16/2019 20:31   Ct Abdomen Pelvis W Contrast  Result Date: 01/16/2019 CLINICAL DATA:  Patient found down. Ground-glass opacity in the posterior left upper lobe on a cervical spine CT obtained earlier today. Hematemesis. History of kidney transplant. EXAM: CT CHEST, ABDOMEN, AND PELVIS WITH CONTRAST TECHNIQUE: Multidetector  CT imaging of the chest, abdomen and pelvis was performed following the standard protocol during bolus administration of intravenous contrast. CONTRAST:  145mL OMNIPAQUE IOHEXOL 300 MG/ML  SOLN COMPARISON:  Cervical spine CT and chest radiograph obtained earlier today. FINDINGS: CT CHEST FINDINGS Cardiovascular: Atheromatous coronary artery calcification. Normal sized heart. Minimal pericardial effusion inferiorly with a maximum thickness of 12 mm. Mediastinum/Nodes: No enlarged mediastinal, hilar, or axillary lymph nodes. Thyroid gland, trachea, and esophagus demonstrate no significant findings. Lungs/Pleura: Large number of small, patchy alveolar opacities in the left upper lobe, left lower lobe and right lower lobe. This has a somewhat nodular pattern and is most confluent in the posterolateral aspect of the left upper lobe. No pleural fluid. Musculoskeletal: Unremarkable bones. CT ABDOMEN PELVIS FINDINGS Hepatobiliary: Anterior right lobe liver cyst. Normal appearing gallbladder. Pancreas: Marked diffuse pancreatic atrophy. Spleen: Normal in size without focal abnormality. Adrenals/Urinary Tract: Mild to moderate bilateral renal atrophy and vascular calcifications. Normal appearing left pelvic transplant kidney. Unremarkable urinary bladder and ureters. Stomach/Bowel: Mildly dilated stomach. Mild and moderate dilatation of multiple loops of jejunum. Normal caliber distal small bowel. Rectum and distal sigmoid colon distended by stool. Moderate amount of stool elsewhere in the colon. Vascular/Lymphatic: No significant vascular findings are present. No enlarged abdominal or pelvic lymph nodes. Reproductive: Prostate is unremarkable. Other: Oval soft tissue and lower density surrounding a curvilinear calcific density in the mesentery between bowel loops in the left mid abdomen, anteriorly. This measures 2.4 x 1.4 cm on axial image number 79 series 2 and 1.9 cm in length on coronal image number 37. This is also seen  on sagittal image number 102. There is an additional curvilinear  density slightly more superiorly in the midline. The patient had a peritoneal dialysis catheter placed on 12/06/2014, not currently demonstrated. Musculoskeletal: Unremarkable bones. IMPRESSION: 1. Bilateral multilobar pneumonia with a nodular configuration, involving the left upper lobe, left lower lobe and right lower lobe. This has an appearance suggesting infection with an atypical organism. 2. 2.4 x 1.4 x 1.9 cm curvilinear soft tissue and lower density surrounding a curvilinear calcific density in the mesentery in the left mid abdomen. This may represent a peritoneal dialysis catheter fragment with surrounding foreign body reaction. A primary neoplasm such as a carcinoid tumor is less likely. There is an additional nearby possible catheter fragment. 3. Mild and moderate dilatation of multiple loops of jejunum. This could be due to ileus or partial obstruction 4. Moderate amount of stool in the colon with more pronounced stool distending the rectum and distal sigmoid colon. 5. Atheromatous coronary artery calcifications. 6. Minimal pericardial effusion inferiorly. 7. Marked diffuse pancreatic atrophy. 8. Normal appearing left pelvic transplant kidney. 9. Mild to moderate bilateral renal atrophy. Electronically Signed   By: Claudie Revering M.D.   On: 01/16/2019 18:47   Dg Chest Port 1 View  Result Date: 01/16/2019 CLINICAL DATA:  Patient found unresponsive this morning. EXAM: PORTABLE CHEST 1 VIEW COMPARISON:  Single-view of the chest 03/22/2017. PA and lateral chest 11/30/2014. FINDINGS: Lung volumes are slightly lower than on the comparison exams with mild crowding of the bronchovascular structures. Lungs are clear without consolidative process, pneumothorax or pleural effusion. Heart size is normal. No acute or focal bony abnormality. IMPRESSION: Negative chest. Electronically Signed   By: Inge Rise M.D.   On: 01/16/2019 11:46     Pending Labs Unresulted Labs (From admission, onward)    Start     Ordered   01/16/19 2102  CK  Add-on,   AD     01/16/19 2101   01/16/19 2049  HIV antibody (Routine Testing)  Once,   STAT     01/16/19 2051   01/16/19 1118  Urine culture  ONCE - STAT,   STAT     01/16/19 1118   01/16/19 1118  Blood culture (routine x 2)  BLOOD CULTURE X 2,   STAT     01/16/19 1118          Vitals/Pain Today's Vitals   01/16/19 1730 01/16/19 1830 01/16/19 1943 01/16/19 2100  BP: 133/74 134/85 129/81 134/80  Pulse: (!) 111 (!) 106 (!) 105 (!) 108  Resp:    16  Temp:      TempSrc:      SpO2: 98% 97% 98% 97%  Weight:      PainSc:        Isolation Precautions No active isolations  Medications Medications  dextrose 10 % infusion ( Intravenous Stopped 01/16/19 1731)  0.9 %  sodium chloride infusion (has no administration in time range)  pantoprazole (PROTONIX) 80 mg in sodium chloride 0.9 % 250 mL (0.32 mg/mL) infusion (has no administration in time range)  lidocaine (XYLOCAINE) 2 % jelly 1 application (has no administration in time range)  LORazepam (ATIVAN) injection 1 mg (has no administration in time range)  levETIRAcetam (KEPPRA) IVPB 1000 mg/100 mL premix (has no administration in time range)  levETIRAcetam (KEPPRA) IVPB 500 mg/100 mL premix (has no administration in time range)  0.9 %  sodium chloride infusion (has no administration in time range)  ceFEPIme (MAXIPIME) 2 g in sodium chloride 0.9 % 100 mL IVPB (0 g Intravenous Stopped 01/16/19 1404)  metroNIDAZOLE (FLAGYL) IVPB 500 mg (0 mg Intravenous Stopped 01/16/19 1405)  sodium chloride 0.9 % bolus 1,000 mL (0 mLs Intravenous Stopped 01/16/19 1826)  vancomycin (VANCOCIN) 1,500 mg in sodium chloride 0.9 % 500 mL IVPB (0 mg Intravenous Stopped 01/16/19 1636)  LORazepam (ATIVAN) injection 0.5 mg (0.5 mg Intravenous Given 01/16/19 1725)  pantoprazole (PROTONIX) 80 mg in sodium chloride 0.9 % 100 mL IVPB (0 mg Intravenous Stopped 01/16/19 2113)   iohexol (OMNIPAQUE) 300 MG/ML solution 100 mL (100 mLs Intravenous Contrast Given 01/16/19 1759)  LORazepam (ATIVAN) injection 0.5 mg (0.5 mg Intravenous Given 01/16/19 1823)    Mobility Walks normally but unable at this time Moderate fall risk   Focused Assessments Neuro Assessment Handoff:  Swallow screen pass? No  Cardiac Rhythm: Normal sinus rhythm       Neuro Assessment:   Neuro Checks:      Last Documented NIHSS Modified Score:   Has TPA been given? No If patient is a Neuro Trauma and patient is going to OR before floor call report to [place] nurse: [phone] or [phone]     R Recommendations: See Admitting Provider Note  Report given to:   Additional Notes:

## 2019-01-16 NOTE — ED Notes (Signed)
Pharmacy will send the protonix and keppra

## 2019-01-17 ENCOUNTER — Ambulatory Visit (HOSPITAL_COMMUNITY)
Admission: AD | Admit: 2019-01-17 | Discharge: 2019-01-17 | Disposition: A | Discharge status: Home / Self Care | Payer: Medicare Other | Source: Other Acute Inpatient Hospital | Attending: Internal Medicine | Admitting: Internal Medicine

## 2019-01-17 ENCOUNTER — Other Ambulatory Visit: Payer: Self-pay | Admitting: Internal Medicine

## 2019-01-17 ENCOUNTER — Other Ambulatory Visit: Payer: Medicare Other

## 2019-01-17 DIAGNOSIS — G9340 Encephalopathy, unspecified: Secondary | ICD-10-CM

## 2019-01-17 LAB — CBC
HCT: 34 % — ABNORMAL LOW (ref 39.0–52.0)
Hemoglobin: 11.3 g/dL — ABNORMAL LOW (ref 13.0–17.0)
MCH: 30.4 pg (ref 26.0–34.0)
MCHC: 33.2 g/dL (ref 30.0–36.0)
MCV: 91.4 fL (ref 80.0–100.0)
Platelets: 198 10*3/uL (ref 150–400)
RBC: 3.72 MIL/uL — ABNORMAL LOW (ref 4.22–5.81)
RDW: 13.3 % (ref 11.5–15.5)
WBC: 16 10*3/uL — ABNORMAL HIGH (ref 4.0–10.5)
nRBC: 0 % (ref 0.0–0.2)

## 2019-01-17 LAB — URINE CULTURE: Culture: NO GROWTH

## 2019-01-17 LAB — GLUCOSE, CAPILLARY
Glucose-Capillary: 197 mg/dL — ABNORMAL HIGH (ref 70–99)
Glucose-Capillary: 160 mg/dL — ABNORMAL HIGH (ref 70–99)
Glucose-Capillary: 146 mg/dL — ABNORMAL HIGH (ref 70–99)
Glucose-Capillary: 130 mg/dL — ABNORMAL HIGH (ref 70–99)
Glucose-Capillary: 70 mg/dL (ref 70–99)
Glucose-Capillary: 184 mg/dL — ABNORMAL HIGH (ref 70–99)

## 2019-01-17 LAB — COMPREHENSIVE METABOLIC PANEL
ALT: 27 U/L (ref 0–44)
AST: 26 U/L (ref 15–41)
Albumin: 3.2 g/dL — ABNORMAL LOW (ref 3.5–5.0)
Alkaline Phosphatase: 58 U/L (ref 38–126)
Anion gap: 9 (ref 5–15)
BUN: 23 mg/dL — ABNORMAL HIGH (ref 6–20)
CO2: 23 mmol/L (ref 22–32)
Calcium: 8.9 mg/dL (ref 8.9–10.3)
Chloride: 111 mmol/L (ref 98–111)
Creatinine, Ser: 1.04 mg/dL (ref 0.61–1.24)
GFR calc Af Amer: >60 mL/min (ref >60)
GFR calc non Af Amer: >60 mL/min (ref >60)
Glucose, Bld: 159 mg/dL — ABNORMAL HIGH (ref 70–99)
Potassium: 4 mmol/L (ref 3.5–5.1)
Sodium: 143 mmol/L (ref 135–145)
Total Bilirubin: 2.4 mg/dL — ABNORMAL HIGH (ref 0.3–1.2)
Total Protein: 5.9 g/dL — ABNORMAL LOW (ref 6.5–8.1)

## 2019-01-17 LAB — PHOSPHORUS: Phosphorus: 2.1 mg/dL — ABNORMAL LOW (ref 2.5–4.6)

## 2019-01-17 LAB — PROCALCITONIN: Procalcitonin: 6.1 ng/mL

## 2019-01-17 LAB — HEMOGLOBIN A1C
Hgb A1c MFr Bld: 7.5 % — ABNORMAL HIGH (ref 4.8–5.6)
Mean Plasma Glucose: 168.55 mg/dL

## 2019-01-17 LAB — MAGNESIUM: Magnesium: 1.6 mg/dL — ABNORMAL LOW (ref 1.7–2.4)

## 2019-01-17 LAB — AMMONIA: Ammonia: <9 umol/L — ABNORMAL LOW (ref 9–35)

## 2019-01-17 MED ORDER — METOCLOPRAMIDE HCL 5 MG/ML IJ SOLN: 5.0000 mg | Freq: Four times a day (QID) | INTRAMUSCULAR | Status: DC | PRN

## 2019-01-17 MED ORDER — INSULIN GLARGINE 100 UNIT/ML ~~LOC~~ SOLN
10.0000 [IU] | Freq: Every day | SUBCUTANEOUS | Status: DC
  Administered 2019-01-17: 01:00:00 10 [IU] via SUBCUTANEOUS
  Filled 2019-01-17 (×2): qty 0.1

## 2019-01-17 MED ORDER — SODIUM PHOSPHATES 45 MMOLE/15ML IV SOLN
10.0000 mmol | Freq: Once | INTRAVENOUS | Status: AC
  Administered 2019-01-17: 09:00:00 10 mmol via INTRAVENOUS
  Filled 2019-01-17: qty 3.33

## 2019-01-17 MED ORDER — MAGNESIUM SULFATE 2 GM/50ML IV SOLN
2.0000 g | Freq: Once | INTRAVENOUS | Status: AC
  Administered 2019-01-17: 12:00:00 2 g via INTRAVENOUS
  Filled 2019-01-17: qty 50

## 2019-01-17 MED ORDER — ACETAMINOPHEN 650 MG RE SUPP
650.0000 mg | Freq: Four times a day (QID) | RECTAL | Status: DC | PRN
  Administered 2019-01-17: 650 mg via RECTAL
  Filled 2019-01-17 (×2): qty 1

## 2019-01-17 MED ORDER — METRONIDAZOLE IN NACL 5-0.79 MG/ML-% IV SOLN
500.0000 mg | Freq: Three times a day (TID) | INTRAVENOUS | Status: DC
  Administered 2019-01-17: 11:00:00 500 mg via INTRAVENOUS
  Filled 2019-01-17 (×3): qty 100

## 2019-01-17 MED ORDER — LEVETIRACETAM IN NACL 500 MG/100ML IV SOLN: 500.0000 mg | Freq: Two times a day (BID) | INTRAVENOUS | Status: DC

## 2019-01-17 MED ORDER — CHLORHEXIDINE GLUCONATE CLOTH 2 % EX PADS
6.0000 | MEDICATED_PAD | Freq: Every day | CUTANEOUS | Status: DC
  Administered 2019-01-17: 11:00:00 6 via TOPICAL

## 2019-01-17 MED ORDER — ONDANSETRON HCL 4 MG/2ML IJ SOLN: 4.0000 mg | Freq: Four times a day (QID) | INTRAMUSCULAR | Status: DC | PRN

## 2019-01-17 MED ORDER — PIPERACILLIN-TAZOBACTAM 3.375 G IVPB
3.3750 g | Freq: Three times a day (TID) | INTRAVENOUS | Status: DC
  Administered 2019-01-17: 15:00:00 3.375 g via INTRAVENOUS
  Filled 2019-01-17: qty 50

## 2019-01-17 MED ORDER — INSULIN ASPART 100 UNIT/ML ~~LOC~~ SOLN
0.0000 [IU] | SUBCUTANEOUS | Status: DC
  Administered 2019-01-17 (×4): 3 [IU] via SUBCUTANEOUS
  Administered 2019-01-17: 01:00:00 11 [IU] via SUBCUTANEOUS
  Filled 2019-01-17 (×5): qty 1

## 2019-01-17 MED ORDER — PIPERACILLIN-TAZOBACTAM 3.375 G IVPB: 3.3750 g | Freq: Three times a day (TID) | INTRAVENOUS | Status: DC

## 2019-01-17 MED ORDER — DEXTROSE 50 % IV SOLN
12.5000 g | Freq: Once | INTRAVENOUS | Status: AC
  Administered 2019-01-17: 12.5 g via INTRAVENOUS

## 2019-01-17 MED ORDER — DEXTROSE 50 % IV SOLN
INTRAVENOUS | Status: AC
  Filled 2019-01-17: qty 50

## 2019-01-17 MED ORDER — FLEET ENEMA 7-19 GM/118ML RE ENEM: 1.0000 | ENEMA | Freq: Every day | RECTAL | Status: DC | PRN

## 2019-01-17 NOTE — Progress Notes (Signed)
Pt discharged to [facility] team, en route to [facility]. Report given to [facility]. All questions answered.

## 2019-01-17 NOTE — Consult Note (Signed)
Pharmacy Antibiotic Note  Gabriel Huff. is a 43 y.o. male admitted on 01/16/2019 with pneumonia (with possible aspiration pneumonitis)  Pharmacy has been consulted for Zosyn dosing.  Plan: Zosyn 3.375mg  q8h (extended infusion)  Weight: 178 lb 2.1 oz (80.8 kg)  Temp (24hrs), Avg:100.2 F (37.9 C), Min:99.3 F (37.4 C), Max:101.8 F (38.8 C)  Recent Labs  Lab 01/16/19 1117 01/16/19 1216 01/16/19 1846 01/17/19 0546  WBC 13.8*  --  17.3* 16.0*  CREATININE 1.21  --  1.16 1.04  LATICACIDVEN 2.1* 1.1  --   --     CrCl cannot be calculated (Unknown ideal weight.).    Allergies  Allergen Reactions  . Losartan Swelling and Other (See Comments)    Reaction:  Feet/ankle swelling   . Oxycodone Nausea Only    Antimicrobials this admission: Vanc/Cefepime 9/6 x 1 Metronidazole 9/6>9/7 Zosyn 9/7 >>  Dose adjustments this admission: None  Microbiology results: 9/6 BCx: pending 9/6 UCx: pending    Thank you for allowing pharmacy to be a part of this patient's care.  Lu Duffel, PharmD, BCPS Clinical Pharmacist 01/17/2019 2:16 PM;

## 2019-01-17 NOTE — Progress Notes (Signed)
eeg completed ° °

## 2019-01-17 NOTE — Consult Note (Signed)
Reason for Consult:AMS Referring Physician: Kasa  CC: AMS  HPI: Gabriel Huff. is an 43 y.o. male with past medical history of kidney and pancreas transplant on 03/18/2016 at [facility].  Unfortunately his pancreas transplant failed due to complications now on pancreas transplant list at [facility], ESRD was on PD prior to kidney transplant, hypertension, Diabetes mellitus on the Medtronic 630 G insulin pump presenting to the ED today with unresponsiveness.  Patient unable to provide any history due to mental status therefore all history obtained from the chart.  Patient lives by himself and is independent of ADLs.  His father last saw him on Friday when he came over to his house to have breakfast with him at around 8 AM and left at around 11 AM that morning. Patient's friend talked to him on Saturday but yesteray when they attempted to call him he would not respond.  Patient found him unresponsive on the bed. Per patient's father it appeared patient had washed and combed his hair preparing to go to church. Per EMS reports, patient was found to have blood sugar of 41.  He was also noted to have lost control of his bladder and bowel with blood in his mouth.  Patient hypertensive on arrival to ED with elevated white blood cell count and chest CT showing bilateral multilobar PNA.     Past Medical History:  Diagnosis Date  . Cataract    bilateral  . End stage renal disease ([place])    peritoneal dialysis since 01/2015  . Hypertension   . Type 1 diabetes [facility])     Past Surgical History:  Procedure Laterality Date  . CAPD INSERTION N/A 12/06/2014   Procedure: LAPAROSCOPIC INSERTION CONTINUOUS AMBULATORY PERITONEAL DIALYSIS  (CAPD) CATHETER;  Surgeon: Katha Cabal, MD;  Location: [facility] ORS;  Service: Vascular;  Laterality: N/A;  . CATARACT EXTRACTION W/ INTRAOCULAR LENS IMPLANT Bilateral 2015  . CYST REMOVAL TRUNK    . GYNECOMASTIA EXCISION  1993  . KIDNEY TRANSPLANT      Family History  Problem  Relation Age of Onset  . Diabetes Mother     Social History:  reports that he has never smoked. He has never used smokeless tobacco. He reports that he does not drink alcohol or use drugs.  Allergies  Allergen Reactions  . Losartan Swelling and Other (See Comments)    Reaction:  Feet/ankle swelling   . Oxycodone Nausea Only    Medications:  I have reviewed the patient's current medications. Prior to Admission:  Medications Prior to Admission  Medication Sig Dispense Refill Last Dose  . atorvastatin (LIPITOR) 20 MG tablet Take 20 mg by mouth daily.   unknown at unknown  . insulin aspart (NOVOLOG) 100 UNIT/ML injection Use your insulin pump as before per instructions upt 50 units/day 10 mL 0 unknown at unknown  . tacrolimus (PROGRAF) 0.5 MG capsule Take 3 mg by mouth every 12 (twelve) hours.   unknown at unknown  . aspirin EC 81 MG tablet Take 1 tablet daily by mouth.     . Multiple Vitamin (MULTIVITAMIN WITH MINERALS) TABS tablet Take 1 tablet by mouth daily.     . mycophenolate (MYFORTIC) 180 MG EC tablet Take 2 tablets 2 (two) times daily by mouth.     . predniSONE (DELTASONE) 5 MG tablet Take 1 tablet daily by mouth.      Scheduled: . Chlorhexidine Gluconate Cloth  6 each Topical Daily  . insulin aspart  0-15 Units Subcutaneous Q4H  .  insulin glargine  10 Units Subcutaneous QHS  . lidocaine  1 application Urethral Once    ROS: Unable to provide due to mental status  Physical Examination: Blood pressure (!) 100/57, pulse 98, temperature 99.5 F (37.5 C), temperature source Axillary, resp. rate 16, weight 80.8 kg, SpO2 99 %.  HEENT-  Normocephalic, no lesions, without obvious abnormality.  Normal external eye and conjunctiva.  Normal TM's bilaterally.  Normal auditory canals and external ears. Normal external nose, mucus membranes and septum.  Normal pharynx. Cardiovascular- S1, S2 normal, pulses palpable throughout   Lungs- audible wheezes Abdomen- soft, non-tender; bowel  sounds normal; no masses,  no organomegaly Extremities- no edema Lymph-no adenopathy palpable Musculoskeletal-no joint tenderness, deformity or swelling Skin-warm and dry, no hyperpigmentation, vitiligo, or suspicious lesions  Neurological Examination   Mental Status: Lethargic.  Can be awakened with stimulation but does not maintain wakefulness.  Does not follow commands.  No verbalizations noted.  Cranial Nerves: II: patient does not respond confrontation bilaterally, pupils unequal irregular and unreactive.  III,IV,VI: Oculocephalic response present bilaterally.  V,VII: corneal reflex present bilaterally  VIII: patient does not respond to verbal stimuli IX,X: gag reflex unable to test, XI: trapezius strength unable to test bilaterally XII: tongue strength unable to test Motor: Patient with normal tone throughout.  Moves upper extremities spontaneously Sensory: Respond to noxious stimuli in the RLE more than the LLE.  Does not respond to noxious stimuli in the upper extremities. Deep Tendon Reflexes:  Symmetric throughout. Plantars: Mute  bilaterally Cerebellar: Unable to perform        Laboratory Studies:   Basic Metabolic Panel: Recent Labs  Lab 01/16/19 1117 01/16/19 1846 01/17/19 0546  NA 142 137 143  K 3.1* 4.6 4.0  CL 107 105 111  CO2 25 22 23   GLUCOSE 134* 316* 159*  BUN 22* 20 23*  CREATININE 1.21 1.16 1.04  CALCIUM 9.5 8.5* 8.9  MG  --   --  1.6*  PHOS  --   --  2.1*    Liver Function Tests: Recent Labs  Lab 01/16/19 1117 01/17/19 0546  AST 31 26  ALT 40 27  ALKPHOS 68 58  BILITOT 1.4* 2.4*  PROT 6.8 5.9*  ALBUMIN 4.2 3.2*   Recent Labs  Lab 01/16/19 1117  LIPASE 14   Recent Labs  Lab 01/17/19 0821  AMMONIA <9*    CBC: Recent Labs  Lab 01/16/19 1117 01/16/19 1846 01/17/19 0546  WBC 13.8* 17.3* 16.0*  NEUTROABS 11.2* 14.2*  --   HGB 13.3 12.2* 11.3*  HCT 39.7 36.7* 34.0*  MCV 91.5 91.3 91.4  PLT 206 203 198    Cardiac  Enzymes: Recent Labs  Lab 01/16/19 1846  CKTOTAL 382    BNP: Invalid input(s): POCBNP  CBG: Recent Labs  Lab 01/16/19 1418 01/16/19 1730 01/16/19 2110 01/17/19 0401 01/17/19 1005  GLUCAP 125* 321* 306* 197* 160*    Microbiology: Results for orders placed or performed during the hospital encounter of 01/16/19  Blood culture (routine x 2)     Status: None (Preliminary result)   Collection Time: 01/16/19 11:17 AM   Specimen: BLOOD  Result Value Ref Range Status   Specimen Description BLOOD LAC  Final   Special Requests   Final    BOTTLES DRAWN AEROBIC AND ANAEROBIC Blood Culture adequate volume   Culture   Final    NO GROWTH < 24 HOURS Performed at [facility], [address] [place], [place] [address]  Report Status PENDING  Incomplete  Blood culture (routine x 2)     Status: None (Preliminary result)   Collection Time: 01/16/19 11:45 AM   Specimen: BLOOD  Result Value Ref Range Status   Specimen Description BLOOD L HAND  Final   Special Requests   Final    BOTTLES DRAWN AEROBIC AND ANAEROBIC Blood Culture adequate volume   Culture   Final    NO GROWTH < 24 HOURS Performed at [facility], [address] [place], [place] [address]    Report Status PENDING  Incomplete  SARS Coronavirus 2 [facility] order, Performed in [facility] hospital lab) Nasopharyngeal Nasopharyngeal Swab     Status: None   Collection Time: 01/16/19 12:16 PM   Specimen: Nasopharyngeal Swab  Result Value Ref Range Status   SARS Coronavirus 2 NEGATIVE NEGATIVE Final    Comment: (NOTE) If result is NEGATIVE SARS-CoV-2 target nucleic acids are NOT DETECTED. The SARS-CoV-2 RNA is generally detectable in upper and lower  respiratory specimens during the acute phase of infection. The lowest  concentration of SARS-CoV-2 viral copies this assay can detect is 250  copies / mL. A negative result does not preclude SARS-CoV-2 infection  and should not be used as the sole basis  for treatment or other  patient management decisions.  A negative result may occur with  improper specimen collection / handling, submission of specimen other  than nasopharyngeal swab, presence of viral mutation(s) within the  areas targeted by this assay, and inadequate number of viral copies  (<250 copies / mL). A negative result must be combined with clinical  observations, patient history, and epidemiological information. If result is POSITIVE SARS-CoV-2 target nucleic acids are DETECTED. The SARS-CoV-2 RNA is generally detectable in upper and lower  respiratory specimens dur ing the acute phase of infection.  Positive  results are indicative of active infection with SARS-CoV-2.  Clinical  correlation with patient history and other diagnostic information is  necessary to determine patient infection status.  Positive results do  not rule out bacterial infection or co-infection with other viruses. If result is PRESUMPTIVE POSTIVE SARS-CoV-2 nucleic acids MAY BE PRESENT.   A presumptive positive result was obtained on the submitted specimen  and confirmed on repeat testing.  While 2019 novel coronavirus  (SARS-CoV-2) nucleic acids may be present in the submitted sample  additional confirmatory testing may be necessary for epidemiological  and / or clinical management purposes  to differentiate between  SARS-CoV-2 and other Sarbecovirus currently known to infect humans.  If clinically indicated additional testing with an alternate test  methodology [phone]) is advised. The SARS-CoV-2 RNA is generally  detectable in upper and lower respiratory sp ecimens during the acute  phase of infection. The expected result is Negative. Fact Sheet for Patients:  StrictlyIdeas.no Fact Sheet for Healthcare Providers: BankingDealers.co.za This test is not yet approved or cleared by the [place] FDA and has been authorized for detection and/or  diagnosis of SARS-CoV-2 by FDA under an Emergency Use Authorization (EUA).  This EUA will remain in effect (meaning this test can be used) for the duration of the COVID-19 declaration under Section 564(b)(1) of the Act, 21 U.S.C. section 360bbb-3(b)(1), unless the authorization is terminated or revoked sooner. Performed at [facility], [address] [place], [place] [address]     Coagulation Studies: Recent Labs    01/16/19 1117  LABPROT 13.1  INR 1.0    Urinalysis:  Recent Labs  Lab 01/16/19 1941  COLORURINE YELLOW*  LABSPEC 1.026  PHURINE 6.0  GLUCOSEU >=500*  HGBUR NEGATIVE  BILIRUBINUR NEGATIVE  KETONESUR 20*  PROTEINUR NEGATIVE  NITRITE NEGATIVE  LEUKOCYTESUR NEGATIVE    Lipid Panel:  No results found for: CHOL, TRIG, HDL, CHOLHDL, VLDL, LDLCALC  HgbA1C:  Lab Results  Component Value Date   HGBA1C 7.5 (H) 01/17/2019    Urine Drug Screen:      Component Value Date/Time   LABOPIA NONE DETECTED 01/16/2019 1941   COCAINSCRNUR NONE DETECTED 01/16/2019 1941   LABBENZ NONE DETECTED 01/16/2019 1941   AMPHETMU NONE DETECTED 01/16/2019 1941   THCU NONE DETECTED 01/16/2019 1941   LABBARB NONE DETECTED 01/16/2019 1941    Alcohol Level:  Recent Labs  Lab 01/16/19 1117  ETH <10    Other results: EKG: sinus tachycardia at 103 bpm.  Imaging: Ct Head Wo Contrast  Result Date: 01/16/2019 CLINICAL DATA:  PT found down. Unknown down time.  Pt with hx of DM EXAM: CT HEAD WITHOUT CONTRAST CT CERVICAL SPINE WITHOUT CONTRAST TECHNIQUE: Multidetector CT imaging of the head and cervical spine was performed following the standard protocol without intravenous contrast. Multiplanar CT image reconstructions of the cervical spine were also generated. COMPARISON:  None. FINDINGS: CT HEAD FINDINGS Brain: Prominent cisterna magna or posterior fossa arachnoid cyst. There is no other intra or extra-axial fluid collection or mass lesion. The basilar cisterns and  ventricles have a normal appearance. There is no CT evidence for acute infarction or hemorrhage. Vascular: No hyperdense vessel or unexpected calcification. Skull: Normal. Negative for fracture or focal lesion. Sinuses/Orbits: Air-fluid levels within the LEFT maxillary and sphenoid sinuses. Significant mucosal thickening of the paranasal sinuses. Mastoid air cells are normally aerated. Orbits are unremarkable. No acute sinus wall fractures identified. Other: None. CT CERVICAL SPINE FINDINGS Alignment: Normal. Skull base and vertebrae: No acute fracture. No primary bone lesion or focal pathologic process. Soft tissues and spinal canal: No prevertebral fluid or swelling. No visible canal hematoma. Disc levels:  Unremarkable. Upper chest: Partially imaged ground-glass density in the posterior LEFT UPPER lobe. Other: None IMPRESSION: 1. No evidence for acute intracranial abnormality. 2. Prominent cisterna magna or posterior fossa arachnoid cyst. 3. Acute and chronic sinusitis. 4. No evidence for acute cervical spine abnormality. 5. Partially imaged ground-glass density in the posterior LEFT UPPER lobe. Consider further evaluation with chest x-ray. Electronically Signed   By: Nolon Nations M.D.   On: 01/16/2019 12:59   Ct Chest W Contrast  Result Date: 01/16/2019 CLINICAL DATA:  Patient found down. Ground-glass opacity in the posterior left upper lobe on a cervical spine CT obtained earlier today. Hematemesis. History of kidney transplant. EXAM: CT CHEST, ABDOMEN, AND PELVIS WITH CONTRAST TECHNIQUE: Multidetector CT imaging of the chest, abdomen and pelvis was performed following the standard protocol during bolus administration of intravenous contrast. CONTRAST:  131mL OMNIPAQUE IOHEXOL 300 MG/ML  SOLN COMPARISON:  Cervical spine CT and chest radiograph obtained earlier today. FINDINGS: CT CHEST FINDINGS Cardiovascular: Atheromatous coronary artery calcification. Normal sized heart. Minimal pericardial effusion  inferiorly with a maximum thickness of 12 mm. Mediastinum/Nodes: No enlarged mediastinal, hilar, or axillary lymph nodes. Thyroid gland, trachea, and esophagus demonstrate no significant findings. Lungs/Pleura: Large number of small, patchy alveolar opacities in the left upper lobe, left lower lobe and right lower lobe. This has a somewhat nodular pattern and is most confluent in the posterolateral aspect of the left upper lobe. No pleural fluid. Musculoskeletal: Unremarkable bones. CT ABDOMEN PELVIS FINDINGS Hepatobiliary: Anterior right lobe  liver cyst. Normal appearing gallbladder. Pancreas: Marked diffuse pancreatic atrophy. Spleen: Normal in size without focal abnormality. Adrenals/Urinary Tract: Mild to moderate bilateral renal atrophy and vascular calcifications. Normal appearing left pelvic transplant kidney. Unremarkable urinary bladder and ureters. Stomach/Bowel: Mildly dilated stomach. Mild and moderate dilatation of multiple loops of jejunum. Normal caliber distal small bowel. Rectum and distal sigmoid colon distended by stool. Moderate amount of stool elsewhere in the colon. Vascular/Lymphatic: No significant vascular findings are present. No enlarged abdominal or pelvic lymph nodes. Reproductive: Prostate is unremarkable. Other: Oval soft tissue and lower density surrounding a curvilinear calcific density in the mesentery between bowel loops in the left mid abdomen, anteriorly. This measures 2.4 x 1.4 cm on axial image number 79 series 2 and 1.9 cm in length on coronal image number 37. This is also seen on sagittal image number 102. There is an additional curvilinear density slightly more superiorly in the midline. The patient had a peritoneal dialysis catheter placed on 12/06/2014, not currently demonstrated. Musculoskeletal: Unremarkable bones. IMPRESSION: 1. Bilateral multilobar pneumonia with a nodular configuration, involving the left upper lobe, left lower lobe and right lower lobe. This has an  appearance suggesting infection with an atypical organism. 2. 2.4 x 1.4 x 1.9 cm curvilinear soft tissue and lower density surrounding a curvilinear calcific density in the mesentery in the left mid abdomen. This may represent a peritoneal dialysis catheter fragment with surrounding foreign body reaction. A primary neoplasm such as a carcinoid tumor is less likely. There is an additional nearby possible catheter fragment. 3. Mild and moderate dilatation of multiple loops of jejunum. This could be due to ileus or partial obstruction 4. Moderate amount of stool in the colon with more pronounced stool distending the rectum and distal sigmoid colon. 5. Atheromatous coronary artery calcifications. 6. Minimal pericardial effusion inferiorly. 7. Marked diffuse pancreatic atrophy. 8. Normal appearing left pelvic transplant kidney. 9. Mild to moderate bilateral renal atrophy. Electronically Signed   By: Claudie Revering M.D.   On: 01/16/2019 18:47   Ct Cervical Spine Wo Contrast  Result Date: 01/16/2019 CLINICAL DATA:  PT found down. Unknown down time.  Pt with hx of DM EXAM: CT HEAD WITHOUT CONTRAST CT CERVICAL SPINE WITHOUT CONTRAST TECHNIQUE: Multidetector CT imaging of the head and cervical spine was performed following the standard protocol without intravenous contrast. Multiplanar CT image reconstructions of the cervical spine were also generated. COMPARISON:  None. FINDINGS: CT HEAD FINDINGS Brain: Prominent cisterna magna or posterior fossa arachnoid cyst. There is no other intra or extra-axial fluid collection or mass lesion. The basilar cisterns and ventricles have a normal appearance. There is no CT evidence for acute infarction or hemorrhage. Vascular: No hyperdense vessel or unexpected calcification. Skull: Normal. Negative for fracture or focal lesion. Sinuses/Orbits: Air-fluid levels within the LEFT maxillary and sphenoid sinuses. Significant mucosal thickening of the paranasal sinuses. Mastoid air cells are  normally aerated. Orbits are unremarkable. No acute sinus wall fractures identified. Other: None. CT CERVICAL SPINE FINDINGS Alignment: Normal. Skull base and vertebrae: No acute fracture. No primary bone lesion or focal pathologic process. Soft tissues and spinal canal: No prevertebral fluid or swelling. No visible canal hematoma. Disc levels:  Unremarkable. Upper chest: Partially imaged ground-glass density in the posterior LEFT UPPER lobe. Other: None IMPRESSION: 1. No evidence for acute intracranial abnormality. 2. Prominent cisterna magna or posterior fossa arachnoid cyst. 3. Acute and chronic sinusitis. 4. No evidence for acute cervical spine abnormality. 5. Partially imaged ground-glass density in the posterior  LEFT UPPER lobe. Consider further evaluation with chest x-ray. Electronically Signed   By: Nolon Nations M.D.   On: 01/16/2019 12:59   Mr Brain Wo Contrast  Result Date: 01/16/2019 CLINICAL DATA:  Neuro deficits, subacute. Personal history of diabetes and kidney transplant with altered mental status. Hypoglycemia. EXAM: MRI HEAD WITHOUT CONTRAST TECHNIQUE: Multiplanar, multiecho pulse sequences of the brain and surrounding structures were obtained without intravenous contrast. COMPARISON:  None. FINDINGS: Brain: Focal restricted diffusion is present in the posterior hippocampus bilaterally. No other significant restricted diffusion is present. T2 signal changes accompany the diffusion changes. No acute hemorrhage or mass lesion is present. No significant white matter lesions are present. The ventricles are of normal size. No significant extraaxial fluid collection is present. The brainstem and cerebellum are within normal limits. Mega cisterna magna is noted. Vascular: Flow is present in the major intracranial arteries. Skull and upper cervical spine: The craniocervical junction is normal. Upper cervical spine is within normal limits. Marrow signal is unremarkable. Sinuses/Orbits: Fluid level is  present in the left maxillary sinus. Mild mucosal thickening is present in the right maxillary sinus. There is mild mucosal thickening in the anterior ethmoid air cells bilaterally. Fluid level is present in the right sphenoid sinus. Bilateral lens replacements are noted. Globes and orbits are otherwise unremarkable. IMPRESSION: 1. Restricted diffusion and T2 signal changes within the posterior hippocampus bilaterally, consistent with hypoglycemic encephalopathy. No other focal areas are involved. 2. Otherwise normal MRI appearance the brain. 3. Sinus disease Electronically Signed   By: San Morelle M.D.   On: 01/16/2019 20:31   Ct Abdomen Pelvis W Contrast  Result Date: 01/16/2019 CLINICAL DATA:  Patient found down. Ground-glass opacity in the posterior left upper lobe on a cervical spine CT obtained earlier today. Hematemesis. History of kidney transplant. EXAM: CT CHEST, ABDOMEN, AND PELVIS WITH CONTRAST TECHNIQUE: Multidetector CT imaging of the chest, abdomen and pelvis was performed following the standard protocol during bolus administration of intravenous contrast. CONTRAST:  189mL OMNIPAQUE IOHEXOL 300 MG/ML  SOLN COMPARISON:  Cervical spine CT and chest radiograph obtained earlier today. FINDINGS: CT CHEST FINDINGS Cardiovascular: Atheromatous coronary artery calcification. Normal sized heart. Minimal pericardial effusion inferiorly with a maximum thickness of 12 mm. Mediastinum/Nodes: No enlarged mediastinal, hilar, or axillary lymph nodes. Thyroid gland, trachea, and esophagus demonstrate no significant findings. Lungs/Pleura: Large number of small, patchy alveolar opacities in the left upper lobe, left lower lobe and right lower lobe. This has a somewhat nodular pattern and is most confluent in the posterolateral aspect of the left upper lobe. No pleural fluid. Musculoskeletal: Unremarkable bones. CT ABDOMEN PELVIS FINDINGS Hepatobiliary: Anterior right lobe liver cyst. Normal appearing  gallbladder. Pancreas: Marked diffuse pancreatic atrophy. Spleen: Normal in size without focal abnormality. Adrenals/Urinary Tract: Mild to moderate bilateral renal atrophy and vascular calcifications. Normal appearing left pelvic transplant kidney. Unremarkable urinary bladder and ureters. Stomach/Bowel: Mildly dilated stomach. Mild and moderate dilatation of multiple loops of jejunum. Normal caliber distal small bowel. Rectum and distal sigmoid colon distended by stool. Moderate amount of stool elsewhere in the colon. Vascular/Lymphatic: No significant vascular findings are present. No enlarged abdominal or pelvic lymph nodes. Reproductive: Prostate is unremarkable. Other: Oval soft tissue and lower density surrounding a curvilinear calcific density in the mesentery between bowel loops in the left mid abdomen, anteriorly. This measures 2.4 x 1.4 cm on axial image number 79 series 2 and 1.9 cm in length on coronal image number 37. This is also seen on sagittal image  number 102. There is an additional curvilinear density slightly more superiorly in the midline. The patient had a peritoneal dialysis catheter placed on 12/06/2014, not currently demonstrated. Musculoskeletal: Unremarkable bones. IMPRESSION: 1. Bilateral multilobar pneumonia with a nodular configuration, involving the left upper lobe, left lower lobe and right lower lobe. This has an appearance suggesting infection with an atypical organism. 2. 2.4 x 1.4 x 1.9 cm curvilinear soft tissue and lower density surrounding a curvilinear calcific density in the mesentery in the left mid abdomen. This may represent a peritoneal dialysis catheter fragment with surrounding foreign body reaction. A primary neoplasm such as a carcinoid tumor is less likely. There is an additional nearby possible catheter fragment. 3. Mild and moderate dilatation of multiple loops of jejunum. This could be due to ileus or partial obstruction 4. Moderate amount of stool in the colon  with more pronounced stool distending the rectum and distal sigmoid colon. 5. Atheromatous coronary artery calcifications. 6. Minimal pericardial effusion inferiorly. 7. Marked diffuse pancreatic atrophy. 8. Normal appearing left pelvic transplant kidney. 9. Mild to moderate bilateral renal atrophy. Electronically Signed   By: Claudie Revering M.D.   On: 01/16/2019 18:47   Dg Chest Port 1 View  Result Date: 01/16/2019 CLINICAL DATA:  Patient found unresponsive this morning. EXAM: PORTABLE CHEST 1 VIEW COMPARISON:  Single-view of the chest 03/22/2017. PA and lateral chest 11/30/2014. FINDINGS: Lung volumes are slightly lower than on the comparison exams with mild crowding of the bronchovascular structures. Lungs are clear without consolidative process, pneumothorax or pleural effusion. Heart size is normal. No acute or focal bony abnormality. IMPRESSION: Negative chest. Electronically Signed   By: Inge Rise M.D.   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  Unfortunately his pancreas transplant failed due to complications now on pancreas transplant list at [facility], ESRD was on PD prior to kidney transplant, hypertension, Diabetes mellitus on the Medtronic 630 G insulin pump presenting after being found unresponsive and hypoglycemic.  Patient continues to have an altered mental status.  MRI of the brain reviewed and shows bilateral diffusion and T2 hippocampal abnormalities suggestive of hypoglycemic encephalopathy.  Due to being found with bowel/bladder incontinence and blood in mouth can not rule out contribution of seizure activity, likely provoked.  Although other medical issues present, patient has not returned to baseline mental status therefore can not rule out subclinical seizure activity.  Patient started on Keppra.    Recommendations: 1. Seizure precautions 2. EEG with likely need for prolonged  monitoring.  Discussed with CCM and need of transfer for this to be accomplished.  [facility] has been contacted. 3. Agree with continuation of Keppra 4. Agree with continuing to address multiple medical issues.    Alexis Goodell, MD Neurology [phone] 01/17/2019, 10:29 AM

## 2019-01-17 NOTE — Discharge Summary (Signed)
PULMONARY / CRITICAL CARE MEDICINE  Name: Gabriel SLIDER Sr. MRN: [id number] DOB: 08-08-1975    LOS: 1  Referring Provider:  Rufina Falco, NP-C Reason for Referral: Acute encephalopathy, pneumonia and questionable seizure Brief patient description: 43 year old male with a history of type 1 diabetes on an insulin pump status post kidney and pancreatic transplants admitted with multifocal pneumonia, acute encephalopathy, hypoglycemia and questionable seizure activity   HPI: This is a 43 year old male with a history of type 1 diabetes status post pancreatic transplant, end-stage renal disease status post kidney transplant(November 2017), currently on an insulin pump who was found down at home by family.  History is obtained from EMS and ED records as patient is still somnolent and unable to follow commands or respond to my questions.  Apparently patient had missed church on Sunday morning and the last time someone heard from him was the night before.  The girlfriend went to check on him and he did not answer the door so EMS was called to do and a wellness check.  EMS found patient in bed with some dried blood on his face as well as nonbloody emesis next to him.  His respirations were initially agonal and his blood sugar was 41.  He was given D10 and he was placed on oxygen and transported to the ED.  At the ED his respirations improved and he was maintaining his oxygenation and his airway. His ED work-up was significant for hypokalemia with a potassium of 3.1, lactic acid of 2.1, and WBC of 17.3.  His chest x-ray was negative but his CT chest, abdomen and pelvis showed bilateral multilobar pneumonia with nodular configuration in the left upper lobe left lower lobe and right lower lobe, a 2.4 x 1.4 x 1.9 cm curvilinear soft tissue density surrounding a curvilinear calcific density in the mesentery in the left mid abdomen suggestive of fragments of the peritoneal dialysis catheter, a developing ileus and  large amount of stool in the colon.  There was also marked pancreatic atrophy and mild to moderate bilateral renal atrophy.  The ED started him on Vanco and cefepime but his MRSA screen was negative hence the vancomycin was discontinued.  He remains febrile with a temperature of 101.8 F upon arrival in the ICU.[name]  He remains somnolent and only moans and groans to noxious stimulus.  While in the ED, patient had one episode of coffee-ground emesis.  He was started on a Protonix infusion.  Upon arrival in the ICU, he also had one episode of emesis that was dark and mixed with bile and mucus.  No hematemesis noted.  His hemoglobin is stable with just a marginal drop of one-point from 13.3 to12.2.  Hypoglycemia has resolved and patient is currently hyperglycemic with a blood glucose level of 306 g/dL.  Of note, patient's pancreatic transplant failed not long after transplant hence he was restarted on insulin and placed on an insulin pump.  His kidney transplant preserved.  He is currently on Medtronic 630 G insulin pump.  He is on NovoLog at home and can take up to 50 units/day.  He is on prednisone 5 mg daily, Myfortic 180 mg 2 tablets twice a day and he is on Prograf 1.5 mg twice a day.  He has been followed at [facility] and family is requesting transfer to [facility] for continuity of care.  Spoke at length with the transfer center at [facility] and conferenced with the hospitalist team.  Hospitalist team declined to accept patient to a  stepdown bed and recommended we call in the morning for further conferencing.  Past Medical History:  Diagnosis Date  . Cataract    bilateral  . End stage renal disease ([place])    peritoneal dialysis since 01/2015  . Hypertension   . Type 1 diabetes [facility])    Past Surgical History:  Procedure Laterality Date  . CAPD INSERTION N/A 12/06/2014   Procedure: LAPAROSCOPIC INSERTION CONTINUOUS AMBULATORY PERITONEAL DIALYSIS  (CAPD) CATHETER;  Surgeon: Katha Cabal, MD;  Location: [facility] ORS;   Service: Vascular;  Laterality: N/A;  . CATARACT EXTRACTION W/ INTRAOCULAR LENS IMPLANT Bilateral 2015  . CYST REMOVAL TRUNK    . GYNECOMASTIA EXCISION  1993  . KIDNEY TRANSPLANT     No current facility-administered medications on file prior to encounter.    Current Outpatient Medications on File Prior to Encounter  Medication Sig  . atorvastatin (LIPITOR) 20 MG tablet Take 20 mg by mouth daily.  . insulin aspart (NOVOLOG) 100 UNIT/ML injection Use your insulin pump as before per instructions upt 50 units/day  . tacrolimus (PROGRAF) 0.5 MG capsule Take 3 mg by mouth every 12 (twelve) hours.  [name] aspirin EC 81 MG tablet Take 1 tablet daily by mouth.  . Multiple Vitamin (MULTIVITAMIN WITH MINERALS) TABS tablet Take 1 tablet by mouth daily.  . mycophenolate (MYFORTIC) 180 MG EC tablet Take 2 tablets 2 (two) times daily by mouth.  . predniSONE (DELTASONE) 5 MG tablet Take 1 tablet daily by mouth.    Allergies Allergies  Allergen Reactions  . Losartan Swelling and Other (See Comments)    Reaction:  Feet/ankle swelling   . Oxycodone Nausea Only    Family History Family History  Problem Relation Age of Onset  . Diabetes Mother    Social History  reports that he has never smoked. He has never used smokeless tobacco. He reports that he does not drink alcohol or use drugs.  Review Of Systems: Unable to obtain as patient is somnolent  VITAL SIGNS: BP 127/80   Pulse 97   Temp 99.3 F (37.4 C) (Axillary)   Resp 20   Wt 80.8 kg   SpO2 99%   BMI 27.08 kg/m   HEMODYNAMICS:    VENTILATOR SETTINGS:    INTAKE / OUTPUT: I/O last 3 completed shifts: In: 714 [I.V.:714] Out: 1400 [Urine:1400]  PHYSICAL EXAMINATION: General: Well-nourished, well-developed, in no acute distress HEENT: Head is normocephalic and atraumatic, trachea midline, PERRLA, no JVD Neuro: Somnolent, will open eyes moan and groan to noxious stimulus, moves all extremities, no facial droop Cardiovascular:  Apical pulse regular, S1-S2, no murmur regurg or gallop, +2 pulses bilaterally, no edema Lungs: Bilateral breath sounds, diminished in the bases, diffuse rhonchi in anterior all lung fields, no wheezes Abdomen: Nondistended, hypoactive bowel sounds, palpation reveals a distended bladder Musculoskeletal: Positive range of motion, no joint deformities Skin: Warm and dry  LABS:  BMET Recent Labs  Lab 01/16/19 1117 01/16/19 1846 01/17/19 0546  NA 142 137 143  K 3.1* 4.6 4.0  CL 107 105 111  CO2 25 22 23   BUN 22* 20 23*  CREATININE 1.21 1.16 1.04  GLUCOSE 134* 316* 159*    Electrolytes Recent Labs  Lab 01/16/19 1117 01/16/19 1846 01/17/19 0546  CALCIUM 9.5 8.5* 8.9  MG  --   --  1.6*  PHOS  --   --  2.1*    CBC Recent Labs  Lab 01/16/19 1117 01/16/19 1846 01/17/19 0546  WBC 13.8* 17.3* 16.0*  HGB 13.3 12.2* 11.3*  HCT 39.7 36.7* 34.0*  PLT 206 203 198    Coag's Recent Labs  Lab 01/16/19 1117  APTT 24  INR 1.0    Sepsis Markers Recent Labs  Lab 01/16/19 1117 01/16/19 1216 01/17/19 0546  LATICACIDVEN 2.1* 1.1  --   PROCALCITON  --   --  6.10    ABG No results for input(s): PHART, PCO2ART, PO2ART in the last 168 hours.  Liver Enzymes Recent Labs  Lab 01/16/19 1117 01/17/19 0546  AST 31 26  ALT 40 27  ALKPHOS 68 58  BILITOT 1.4* 2.4*  ALBUMIN 4.2 3.2*    Cardiac Enzymes No results for input(s): TROPONINI, PROBNP in the last 168 hours.  Glucose Recent Labs  Lab 01/16/19 1301 01/16/19 1418 01/16/19 1730 01/16/19 2110 01/17/19 0401 01/17/19 1005  GLUCAP 83 125* 321* 306* 197* 160*    Imaging Ct Chest W Contrast  Result Date: 01/16/2019 CLINICAL DATA:  Patient found down. Ground-glass opacity in the posterior left upper lobe on a cervical spine CT obtained earlier today. Hematemesis. History of kidney transplant. EXAM: CT CHEST, ABDOMEN, AND PELVIS WITH CONTRAST TECHNIQUE: Multidetector CT imaging of the chest, abdomen and pelvis was  performed following the standard protocol during bolus administration of intravenous contrast. CONTRAST:  112mL OMNIPAQUE IOHEXOL 300 MG/ML  SOLN COMPARISON:  Cervical spine CT and chest radiograph obtained earlier today. FINDINGS: CT CHEST FINDINGS Cardiovascular: Atheromatous coronary artery calcification. Normal sized heart. Minimal pericardial effusion inferiorly with a maximum thickness of 12 mm. Mediastinum/Nodes: No enlarged mediastinal, hilar, or axillary lymph nodes. Thyroid gland, trachea, and esophagus demonstrate no significant findings. Lungs/Pleura: Large number of small, patchy alveolar opacities in the left upper lobe, left lower lobe and right lower lobe. This has a somewhat nodular pattern and is most confluent in the posterolateral aspect of the left upper lobe. No pleural fluid. Musculoskeletal: Unremarkable bones. CT ABDOMEN PELVIS FINDINGS Hepatobiliary: Anterior right lobe liver cyst. Normal appearing gallbladder. Pancreas: Marked diffuse pancreatic atrophy. Spleen: Normal in size without focal abnormality. Adrenals/Urinary Tract: Mild to moderate bilateral renal atrophy and vascular calcifications. Normal appearing left pelvic transplant kidney. Unremarkable urinary bladder and ureters. Stomach/Bowel: Mildly dilated stomach. Mild and moderate dilatation of multiple loops of jejunum. Normal caliber distal small bowel. Rectum and distal sigmoid colon distended by stool. Moderate amount of stool elsewhere in the colon. Vascular/Lymphatic: No significant vascular findings are present. No enlarged abdominal or pelvic lymph nodes. Reproductive: Prostate is unremarkable. Other: Oval soft tissue and lower density surrounding a curvilinear calcific density in the mesentery between bowel loops in the left mid abdomen, anteriorly. This measures 2.4 x 1.4 cm on axial image number 79 series 2 and 1.9 cm in length on coronal image number 37. This is also seen on sagittal image number 102. There is an  additional curvilinear density slightly more superiorly in the midline. The patient had a peritoneal dialysis catheter placed on 12/06/2014, not currently demonstrated. Musculoskeletal: Unremarkable bones. IMPRESSION: 1. Bilateral multilobar pneumonia with a nodular configuration, involving the left upper lobe, left lower lobe and right lower lobe. This has an appearance suggesting infection with an atypical organism. 2. 2.4 x 1.4 x 1.9 cm curvilinear soft tissue and lower density surrounding a curvilinear calcific density in the mesentery in the left mid abdomen. This may represent a peritoneal dialysis catheter fragment with surrounding foreign body reaction. A primary neoplasm such as a carcinoid tumor is less likely. There is an additional nearby possible catheter fragment. 3.  Mild and moderate dilatation of multiple loops of jejunum. This could be due to ileus or partial obstruction 4. Moderate amount of stool in the colon with more pronounced stool distending the rectum and distal sigmoid colon. 5. Atheromatous coronary artery calcifications. 6. Minimal pericardial effusion inferiorly. 7. Marked diffuse pancreatic atrophy. 8. Normal appearing left pelvic transplant kidney. 9. Mild to moderate bilateral renal atrophy. Electronically Signed   By: Claudie Revering M.D.   On: 01/16/2019 18:47   Mr Brain Wo Contrast  Result Date: 01/16/2019 CLINICAL DATA:  Neuro deficits, subacute. Personal history of diabetes and kidney transplant with altered mental status. Hypoglycemia. EXAM: MRI HEAD WITHOUT CONTRAST TECHNIQUE: Multiplanar, multiecho pulse sequences of the brain and surrounding structures were obtained without intravenous contrast. COMPARISON:  None. FINDINGS: Brain: Focal restricted diffusion is present in the posterior hippocampus bilaterally. No other significant restricted diffusion is present. T2 signal changes accompany the diffusion changes. No acute hemorrhage or mass lesion is present. No significant  white matter lesions are present. The ventricles are of normal size. No significant extraaxial fluid collection is present. The brainstem and cerebellum are within normal limits. Mega cisterna magna is noted. Vascular: Flow is present in the major intracranial arteries. Skull and upper cervical spine: The craniocervical junction is normal. Upper cervical spine is within normal limits. Marrow signal is unremarkable. Sinuses/Orbits: Fluid level is present in the left maxillary sinus. Mild mucosal thickening is present in the right maxillary sinus. There is mild mucosal thickening in the anterior ethmoid air cells bilaterally. Fluid level is present in the right sphenoid sinus. Bilateral lens replacements are noted. Globes and orbits are otherwise unremarkable. IMPRESSION: 1. Restricted diffusion and T2 signal changes within the posterior hippocampus bilaterally, consistent with hypoglycemic encephalopathy. No other focal areas are involved. 2. Otherwise normal MRI appearance the brain. 3. Sinus disease Electronically Signed   By: San Morelle M.D.   On: 01/16/2019 20:31   Ct Abdomen Pelvis W Contrast  Result Date: 01/16/2019 CLINICAL DATA:  Patient found down. Ground-glass opacity in the posterior left upper lobe on a cervical spine CT obtained earlier today. Hematemesis. History of kidney transplant. EXAM: CT CHEST, ABDOMEN, AND PELVIS WITH CONTRAST TECHNIQUE: Multidetector CT imaging of the chest, abdomen and pelvis was performed following the standard protocol during bolus administration of intravenous contrast. CONTRAST:  155mL OMNIPAQUE IOHEXOL 300 MG/ML  SOLN COMPARISON:  Cervical spine CT and chest radiograph obtained earlier today. FINDINGS: CT CHEST FINDINGS Cardiovascular: Atheromatous coronary artery calcification. Normal sized heart. Minimal pericardial effusion inferiorly with a maximum thickness of 12 mm. Mediastinum/Nodes: No enlarged mediastinal, hilar, or axillary lymph nodes. Thyroid  gland, trachea, and esophagus demonstrate no significant findings. Lungs/Pleura: Large number of small, patchy alveolar opacities in the left upper lobe, left lower lobe and right lower lobe. This has a somewhat nodular pattern and is most confluent in the posterolateral aspect of the left upper lobe. No pleural fluid. Musculoskeletal: Unremarkable bones. CT ABDOMEN PELVIS FINDINGS Hepatobiliary: Anterior right lobe liver cyst. Normal appearing gallbladder. Pancreas: Marked diffuse pancreatic atrophy. Spleen: Normal in size without focal abnormality. Adrenals/Urinary Tract: Mild to moderate bilateral renal atrophy and vascular calcifications. Normal appearing left pelvic transplant kidney. Unremarkable urinary bladder and ureters. Stomach/Bowel: Mildly dilated stomach. Mild and moderate dilatation of multiple loops of jejunum. Normal caliber distal small bowel. Rectum and distal sigmoid colon distended by stool. Moderate amount of stool elsewhere in the colon. Vascular/Lymphatic: No significant vascular findings are present. No enlarged abdominal or pelvic lymph nodes. Reproductive:  Prostate is unremarkable. Other: Oval soft tissue and lower density surrounding a curvilinear calcific density in the mesentery between bowel loops in the left mid abdomen, anteriorly. This measures 2.4 x 1.4 cm on axial image number 79 series 2 and 1.9 cm in length on coronal image number 37. This is also seen on sagittal image number 102. There is an additional curvilinear density slightly more superiorly in the midline. The patient had a peritoneal dialysis catheter placed on 12/06/2014, not currently demonstrated. Musculoskeletal: Unremarkable bones. IMPRESSION: 1. Bilateral multilobar pneumonia with a nodular configuration, involving the left upper lobe, left lower lobe and right lower lobe. This has an appearance suggesting infection with an atypical organism. 2. 2.4 x 1.4 x 1.9 cm curvilinear soft tissue and lower density  surrounding a curvilinear calcific density in the mesentery in the left mid abdomen. This may represent a peritoneal dialysis catheter fragment with surrounding foreign body reaction. A primary neoplasm such as a carcinoid tumor is less likely. There is an additional nearby possible catheter fragment. 3. Mild and moderate dilatation of multiple loops of jejunum. This could be due to ileus or partial obstruction 4. Moderate amount of stool in the colon with more pronounced stool distending the rectum and distal sigmoid colon. 5. Atheromatous coronary artery calcifications. 6. Minimal pericardial effusion inferiorly. 7. Marked diffuse pancreatic atrophy. 8. Normal appearing left pelvic transplant kidney. 9. Mild to moderate bilateral renal atrophy. Electronically Signed   By: Claudie Revering M.D.   On: 01/16/2019 18:47   STUDIES:  EEG pending  CULTURES: Blood cultures x2 Urine culture MRSA screen negative COVID-19 negative  ANTIBIOTICS: Cefepime Vancomycin-discontinued given negative MRSA screen We will add Flagyl to cover for aspiration pneumonitis  SIGNIFICANT EVENTS: 01/16/2019: Admitted  LINES/TUBES: Peripheral IVs Foley cath  DISCUSSION: 43 year old male with type 1 diabetes with a failed pancreatic transplant now on an insulin pump, kidney transplant hypertension who presents with acute encephalopathy initially felt to be due to hypoglycemia and possible seizure but now refractory with resolution of hypoglycemia and initiation of antiseizure therapy; multifocal pneumonia likely aspiration, constipation and now hyperglycemic.  ASSESSMENT  Multifocal pneumonia-COVID-19 negative Acute encephalopathy felt to be due to hypoglycemia and possible seizure activity Hypoglycemia-resolved and now in hyperglycemic state Rule out subclinical seizure activity Constipation Nausea and vomiting Type 1 diabetes status post failed pancreatic transplant Status post kidney transplant Electrolyte  abnormalities-hypokalemia, hypomagnesemia and hypophosphatemia   PLAN Neurology consulted and recommended Keppra; patient has been loaded with Keppra Continue to monitor neurologic status closely Blood glucose monitoring every 4 hours with sliding scale insulin coverage Basal coverage with Lantus 10 units at bedtime; first dose given last night EEG to rule out subclinical seizure activity Seizure precautions Continue Protonix infusion Trend hemoglobin and hematocrit IR consult for NG tube placement Keep n.p.o. until mental status improves Monitor fever curve Follow-up cultures Continue antibiotics as above Monitor and correct electrolytes Trend renal indices  Transfer to [facility] pending.   Best Practice: Code Status: Full code Diet: N.p.o. GI prophylaxis: Protonix infusion VTE prophylaxis: SCDs/no pharmacologic VTE prophylaxis until GI bleed is ruled out  [facility] ICU HAS ACCEPTED PATIENT DR Floy Sabina  DISCHARGE MEDS KEPPRA 500mg  BID ZOSYN as per PHARMACY PROTOCOL  01/17/2019, 1:50 PM

## 2019-01-17 NOTE — Consult Note (Signed)
PULMONARY / CRITICAL CARE MEDICINE  Name: [name] MRN: [id number] DOB: Dec 21, 1975    LOS: 1  Referring Provider:  Rufina Falco, NP-C Reason for Referral: Acute encephalopathy, pneumonia and questionable seizure Brief patient description: 43 year old male with a history of type 1 diabetes on an insulin pump status post kidney and pancreatic transplants admitted with multifocal pneumonia, acute encephalopathy, hypoglycemia and questionable seizure activity   HPI: This is a 43 year old male with a history of type 1 diabetes status post pancreatic transplant, end-stage renal disease status post kidney transplant(November 2017), currently on an insulin pump who was found down at home by family.  History is obtained from EMS and ED records as patient is still somnolent and unable to follow commands or respond to my questions.  Apparently patient had missed church on Sunday morning and the last time someone heard from him was the night before.  The girlfriend went to check on him and he did not answer the door so EMS was called to do and a wellness check.  EMS found patient in bed with some dried blood on his face as well as nonbloody emesis next to him.  His respirations were initially agonal and his blood sugar was 41.  He was given D10 and he was placed on oxygen and transported to the ED.  At the ED his respirations improved and he was maintaining his oxygenation and his airway. His ED work-up was significant for hypokalemia with a potassium of 3.1, lactic acid of 2.1, and WBC of 17.3.  His chest x-ray was negative but his CT chest, abdomen and pelvis showed bilateral multilobar pneumonia with nodular configuration in the left upper lobe left lower lobe and right lower lobe, a 2.4 x 1.4 x 1.9 cm curvilinear soft tissue density surrounding a curvilinear calcific density in the mesentery in the left mid abdomen suggestive of fragments of the peritoneal dialysis catheter, a developing ileus and  large amount of stool in the colon.  There was also marked pancreatic atrophy and mild to moderate bilateral renal atrophy.  The ED started him on Vanco and cefepime but his MRSA screen was negative hence the vancomycin was discontinued.  He remains febrile with a temperature of 101.8 F upon arrival in the ICU.[name]  He remains somnolent and only moans and groans to noxious stimulus.  While in the ED, patient had one episode of coffee-ground emesis.  He was started on a Protonix infusion.  Upon arrival in the ICU, he also had one episode of emesis that was dark and mixed with bile and mucus.  No hematemesis noted.  His hemoglobin is stable with just a marginal drop of one-point from 13.3 to12.2.  Hypoglycemia has resolved and patient is currently hyperglycemic with a blood glucose level of 306 g/dL.  Of note, patient's pancreatic transplant failed not long after transplant hence he was restarted on insulin and placed on an insulin pump.  His kidney transplant preserved.  He is currently on Medtronic 630 G insulin pump.  He is on NovoLog at home and can take up to 50 units/day.  He is on prednisone 5 mg daily, Myfortic 180 mg 2 tablets twice a day and he is on Prograf 1.5 mg twice a day.  He has been followed at [facility] and family is requesting transfer to [facility] for continuity of care.  Spoke at length with the transfer center at [facility] and conferenced with the hospitalist team.  Hospitalist team declined to accept patient to a  stepdown bed and recommended we call in the morning for further conferencing.  Past Medical History:  Diagnosis Date  . Cataract    bilateral  . End stage renal disease ([place])    peritoneal dialysis since 01/2015  . Hypertension   . Type 1 diabetes [facility])    Past Surgical History:  Procedure Laterality Date  . CAPD INSERTION N/A 12/06/2014   Procedure: LAPAROSCOPIC INSERTION CONTINUOUS AMBULATORY PERITONEAL DIALYSIS  (CAPD) CATHETER;  Surgeon: Katha Cabal, MD;  Location: [facility] ORS;   Service: Vascular;  Laterality: N/A;  . CATARACT EXTRACTION W/ INTRAOCULAR LENS IMPLANT Bilateral 2015  . CYST REMOVAL TRUNK    . GYNECOMASTIA EXCISION  1993  . KIDNEY TRANSPLANT     No current facility-administered medications on file prior to encounter.    Current Outpatient Medications on File Prior to Encounter  Medication Sig  . atorvastatin (LIPITOR) 20 MG tablet Take 20 mg by mouth daily.  . insulin aspart (NOVOLOG) 100 UNIT/ML injection Use your insulin pump as before per instructions upt 50 units/day  . tacrolimus (PROGRAF) 0.5 MG capsule Take 3 mg by mouth every 12 (twelve) hours.  [name] aspirin EC 81 MG tablet Take 1 tablet daily by mouth.  . Multiple Vitamin (MULTIVITAMIN WITH MINERALS) TABS tablet Take 1 tablet by mouth daily.  . mycophenolate (MYFORTIC) 180 MG EC tablet Take 2 tablets 2 (two) times daily by mouth.  . predniSONE (DELTASONE) 5 MG tablet Take 1 tablet daily by mouth.    Allergies Allergies  Allergen Reactions  . Losartan Swelling and Other (See Comments)    Reaction:  Feet/ankle swelling   . Oxycodone Nausea Only    Family History Family History  Problem Relation Age of Onset  . Diabetes Mother    Social History  reports that he has never smoked. He has never used smokeless tobacco. He reports that he does not drink alcohol or use drugs.  Review Of Systems: Unable to obtain as patient is somnolent  VITAL SIGNS: BP (!) 169/107   Pulse (!) 125   Temp 97.8 F (36.6 C) (Axillary)   Resp (!) 28   Wt 80.8 kg   SpO2 95%   BMI 27.08 kg/m   HEMODYNAMICS:    VENTILATOR SETTINGS:    INTAKE / OUTPUT: No intake/output data recorded.  PHYSICAL EXAMINATION: General: Well-nourished, well-developed, in no acute distress HEENT: Head is normocephalic and atraumatic, trachea midline, PERRLA, no JVD Neuro: Somnolent, will open eyes moan and groan to noxious stimulus, moves all extremities, no facial droop Cardiovascular: Apical pulse regular, S1-S2, no  murmur regurg or gallop, +2 pulses bilaterally, no edema Lungs: Bilateral breath sounds, diminished in the bases, diffuse rhonchi in anterior all lung fields, no wheezes Abdomen: Nondistended, hypoactive bowel sounds, palpation reveals a distended bladder Musculoskeletal: Positive range of motion, no joint deformities Skin: Warm and dry  LABS:  BMET Recent Labs  Lab 01/16/19 1117 01/16/19 1846  NA 142 137  K 3.1* 4.6  CL 107 105  CO2 25 22  BUN 22* 20  CREATININE 1.21 1.16  GLUCOSE 134* 316*    Electrolytes Recent Labs  Lab 01/16/19 1117 01/16/19 1846  CALCIUM 9.5 8.5*    CBC Recent Labs  Lab 01/16/19 1117 01/16/19 1846  WBC 13.8* 17.3*  HGB 13.3 12.2*  HCT 39.7 36.7*  PLT 206 203    Coag's Recent Labs  Lab 01/16/19 1117  APTT 24  INR 1.0    Sepsis Markers Recent Labs  Lab 01/16/19  1117 01/16/19 1216  LATICACIDVEN 2.1* 1.1    ABG No results for input(s): PHART, PCO2ART, PO2ART in the last 168 hours.  Liver Enzymes Recent Labs  Lab 01/16/19 1117  AST 31  ALT 40  ALKPHOS 68  BILITOT 1.4*  ALBUMIN 4.2    Cardiac Enzymes No results for input(s): TROPONINI, PROBNP in the last 168 hours.  Glucose Recent Labs  Lab 01/16/19 1134 01/16/19 1220 01/16/19 1301 01/16/19 1418 01/16/19 1730 01/16/19 2110  GLUCAP 90 87 83 125* 321* 306*    Imaging Ct Head Wo Contrast  Result Date: 01/16/2019 CLINICAL DATA:  PT found down. Unknown down time.  Pt with hx of DM EXAM: CT HEAD WITHOUT CONTRAST CT CERVICAL SPINE WITHOUT CONTRAST TECHNIQUE: Multidetector CT imaging of the head and cervical spine was performed following the standard protocol without intravenous contrast. Multiplanar CT image reconstructions of the cervical spine were also generated. COMPARISON:  None. FINDINGS: CT HEAD FINDINGS Brain: Prominent cisterna magna or posterior fossa arachnoid cyst. There is no other intra or extra-axial fluid collection or mass lesion. The basilar cisterns  and ventricles have a normal appearance. There is no CT evidence for acute infarction or hemorrhage. Vascular: No hyperdense vessel or unexpected calcification. Skull: Normal. Negative for fracture or focal lesion. Sinuses/Orbits: Air-fluid levels within the LEFT maxillary and sphenoid sinuses. Significant mucosal thickening of the paranasal sinuses. Mastoid air cells are normally aerated. Orbits are unremarkable. No acute sinus wall fractures identified. Other: None. CT CERVICAL SPINE FINDINGS Alignment: Normal. Skull base and vertebrae: No acute fracture. No primary bone lesion or focal pathologic process. Soft tissues and spinal canal: No prevertebral fluid or swelling. No visible canal hematoma. Disc levels:  Unremarkable. Upper chest: Partially imaged ground-glass density in the posterior LEFT UPPER lobe. Other: None IMPRESSION: 1. No evidence for acute intracranial abnormality. 2. Prominent cisterna magna or posterior fossa arachnoid cyst. 3. Acute and chronic sinusitis. 4. No evidence for acute cervical spine abnormality. 5. Partially imaged ground-glass density in the posterior LEFT UPPER lobe. Consider further evaluation with chest x-ray. Electronically Signed   By: Nolon Nations M.D.   On: 01/16/2019 12:59   Ct Chest W Contrast  Result Date: 01/16/2019 CLINICAL DATA:  Patient found down. Ground-glass opacity in the posterior left upper lobe on a cervical spine CT obtained earlier today. Hematemesis. History of kidney transplant. EXAM: CT CHEST, ABDOMEN, AND PELVIS WITH CONTRAST TECHNIQUE: Multidetector CT imaging of the chest, abdomen and pelvis was performed following the standard protocol during bolus administration of intravenous contrast. CONTRAST:  161mL OMNIPAQUE IOHEXOL 300 MG/ML  SOLN COMPARISON:  Cervical spine CT and chest radiograph obtained earlier today. FINDINGS: CT CHEST FINDINGS Cardiovascular: Atheromatous coronary artery calcification. Normal sized heart. Minimal pericardial  effusion inferiorly with a maximum thickness of 12 mm. Mediastinum/Nodes: No enlarged mediastinal, hilar, or axillary lymph nodes. Thyroid gland, trachea, and esophagus demonstrate no significant findings. Lungs/Pleura: Large number of small, patchy alveolar opacities in the left upper lobe, left lower lobe and right lower lobe. This has a somewhat nodular pattern and is most confluent in the posterolateral aspect of the left upper lobe. No pleural fluid. Musculoskeletal: Unremarkable bones. CT ABDOMEN PELVIS FINDINGS Hepatobiliary: Anterior right lobe liver cyst. Normal appearing gallbladder. Pancreas: Marked diffuse pancreatic atrophy. Spleen: Normal in size without focal abnormality. Adrenals/Urinary Tract: Mild to moderate bilateral renal atrophy and vascular calcifications. Normal appearing left pelvic transplant kidney. Unremarkable urinary bladder and ureters. Stomach/Bowel: Mildly dilated stomach. Mild and moderate dilatation of multiple loops of  jejunum. Normal caliber distal small bowel. Rectum and distal sigmoid colon distended by stool. Moderate amount of stool elsewhere in the colon. Vascular/Lymphatic: No significant vascular findings are present. No enlarged abdominal or pelvic lymph nodes. Reproductive: Prostate is unremarkable. Other: Oval soft tissue and lower density surrounding a curvilinear calcific density in the mesentery between bowel loops in the left mid abdomen, anteriorly. This measures 2.4 x 1.4 cm on axial image number 79 series 2 and 1.9 cm in length on coronal image number 37. This is also seen on sagittal image number 102. There is an additional curvilinear density slightly more superiorly in the midline. The patient had a peritoneal dialysis catheter placed on 12/06/2014, not currently demonstrated. Musculoskeletal: Unremarkable bones. IMPRESSION: 1. Bilateral multilobar pneumonia with a nodular configuration, involving the left upper lobe, left lower lobe and right lower lobe. This  has an appearance suggesting infection with an atypical organism. 2. 2.4 x 1.4 x 1.9 cm curvilinear soft tissue and lower density surrounding a curvilinear calcific density in the mesentery in the left mid abdomen. This may represent a peritoneal dialysis catheter fragment with surrounding foreign body reaction. A primary neoplasm such as a carcinoid tumor is less likely. There is an additional nearby possible catheter fragment. 3. Mild and moderate dilatation of multiple loops of jejunum. This could be due to ileus or partial obstruction 4. Moderate amount of stool in the colon with more pronounced stool distending the rectum and distal sigmoid colon. 5. Atheromatous coronary artery calcifications. 6. Minimal pericardial effusion inferiorly. 7. Marked diffuse pancreatic atrophy. 8. Normal appearing left pelvic transplant kidney. 9. Mild to moderate bilateral renal atrophy. Electronically Signed   By: Claudie Revering M.D.   On: 01/16/2019 18:47   Ct Cervical Spine Wo Contrast  Result Date: 01/16/2019 CLINICAL DATA:  PT found down. Unknown down time.  Pt with hx of DM EXAM: CT HEAD WITHOUT CONTRAST CT CERVICAL SPINE WITHOUT CONTRAST TECHNIQUE: Multidetector CT imaging of the head and cervical spine was performed following the standard protocol without intravenous contrast. Multiplanar CT image reconstructions of the cervical spine were also generated. COMPARISON:  None. FINDINGS: CT HEAD FINDINGS Brain: Prominent cisterna magna or posterior fossa arachnoid cyst. There is no other intra or extra-axial fluid collection or mass lesion. The basilar cisterns and ventricles have a normal appearance. There is no CT evidence for acute infarction or hemorrhage. Vascular: No hyperdense vessel or unexpected calcification. Skull: Normal. Negative for fracture or focal lesion. Sinuses/Orbits: Air-fluid levels within the LEFT maxillary and sphenoid sinuses. Significant mucosal thickening of the paranasal sinuses. Mastoid air cells  are normally aerated. Orbits are unremarkable. No acute sinus wall fractures identified. Other: None. CT CERVICAL SPINE FINDINGS Alignment: Normal. Skull base and vertebrae: No acute fracture. No primary bone lesion or focal pathologic process. Soft tissues and spinal canal: No prevertebral fluid or swelling. No visible canal hematoma. Disc levels:  Unremarkable. Upper chest: Partially imaged ground-glass density in the posterior LEFT UPPER lobe. Other: None IMPRESSION: 1. No evidence for acute intracranial abnormality. 2. Prominent cisterna magna or posterior fossa arachnoid cyst. 3. Acute and chronic sinusitis. 4. No evidence for acute cervical spine abnormality. 5. Partially imaged ground-glass density in the posterior LEFT UPPER lobe. Consider further evaluation with chest x-ray. Electronically Signed   By: Nolon Nations M.D.   On: 01/16/2019 12:59   Mr Brain Wo Contrast  Result Date: 01/16/2019 CLINICAL DATA:  Neuro deficits, subacute. Personal history of diabetes and kidney transplant with altered mental status. Hypoglycemia.  EXAM: MRI HEAD WITHOUT CONTRAST TECHNIQUE: Multiplanar, multiecho pulse sequences of the brain and surrounding structures were obtained without intravenous contrast. COMPARISON:  None. FINDINGS: Brain: Focal restricted diffusion is present in the posterior hippocampus bilaterally. No other significant restricted diffusion is present. T2 signal changes accompany the diffusion changes. No acute hemorrhage or mass lesion is present. No significant white matter lesions are present. The ventricles are of normal size. No significant extraaxial fluid collection is present. The brainstem and cerebellum are within normal limits. Mega cisterna magna is noted. Vascular: Flow is present in the major intracranial arteries. Skull and upper cervical spine: The craniocervical junction is normal. Upper cervical spine is within normal limits. Marrow signal is unremarkable. Sinuses/Orbits: Fluid level  is present in the left maxillary sinus. Mild mucosal thickening is present in the right maxillary sinus. There is mild mucosal thickening in the anterior ethmoid air cells bilaterally. Fluid level is present in the right sphenoid sinus. Bilateral lens replacements are noted. Globes and orbits are otherwise unremarkable. IMPRESSION: 1. Restricted diffusion and T2 signal changes within the posterior hippocampus bilaterally, consistent with hypoglycemic encephalopathy. No other focal areas are involved. 2. Otherwise normal MRI appearance the brain. 3. Sinus disease Electronically Signed   By: San Morelle M.D.   On: 01/16/2019 20:31   Ct Abdomen Pelvis W Contrast  Result Date: 01/16/2019 CLINICAL DATA:  Patient found down. Ground-glass opacity in the posterior left upper lobe on a cervical spine CT obtained earlier today. Hematemesis. History of kidney transplant. EXAM: CT CHEST, ABDOMEN, AND PELVIS WITH CONTRAST TECHNIQUE: Multidetector CT imaging of the chest, abdomen and pelvis was performed following the standard protocol during bolus administration of intravenous contrast. CONTRAST:  134mL OMNIPAQUE IOHEXOL 300 MG/ML  SOLN COMPARISON:  Cervical spine CT and chest radiograph obtained earlier today. FINDINGS: CT CHEST FINDINGS Cardiovascular: Atheromatous coronary artery calcification. Normal sized heart. Minimal pericardial effusion inferiorly with a maximum thickness of 12 mm. Mediastinum/Nodes: No enlarged mediastinal, hilar, or axillary lymph nodes. Thyroid gland, trachea, and esophagus demonstrate no significant findings. Lungs/Pleura: Large number of small, patchy alveolar opacities in the left upper lobe, left lower lobe and right lower lobe. This has a somewhat nodular pattern and is most confluent in the posterolateral aspect of the left upper lobe. No pleural fluid. Musculoskeletal: Unremarkable bones. CT ABDOMEN PELVIS FINDINGS Hepatobiliary: Anterior right lobe liver cyst. Normal appearing  gallbladder. Pancreas: Marked diffuse pancreatic atrophy. Spleen: Normal in size without focal abnormality. Adrenals/Urinary Tract: Mild to moderate bilateral renal atrophy and vascular calcifications. Normal appearing left pelvic transplant kidney. Unremarkable urinary bladder and ureters. Stomach/Bowel: Mildly dilated stomach. Mild and moderate dilatation of multiple loops of jejunum. Normal caliber distal small bowel. Rectum and distal sigmoid colon distended by stool. Moderate amount of stool elsewhere in the colon. Vascular/Lymphatic: No significant vascular findings are present. No enlarged abdominal or pelvic lymph nodes. Reproductive: Prostate is unremarkable. Other: Oval soft tissue and lower density surrounding a curvilinear calcific density in the mesentery between bowel loops in the left mid abdomen, anteriorly. This measures 2.4 x 1.4 cm on axial image number 79 series 2 and 1.9 cm in length on coronal image number 37. This is also seen on sagittal image number 102. There is an additional curvilinear density slightly more superiorly in the midline. The patient had a peritoneal dialysis catheter placed on 12/06/2014, not currently demonstrated. Musculoskeletal: Unremarkable bones. IMPRESSION: 1. Bilateral multilobar pneumonia with a nodular configuration, involving the left upper lobe, left lower lobe and right lower lobe.  This has an appearance suggesting infection with an atypical organism. 2. 2.4 x 1.4 x 1.9 cm curvilinear soft tissue and lower density surrounding a curvilinear calcific density in the mesentery in the left mid abdomen. This may represent a peritoneal dialysis catheter fragment with surrounding foreign body reaction. A primary neoplasm such as a carcinoid tumor is less likely. There is an additional nearby possible catheter fragment. 3. Mild and moderate dilatation of multiple loops of jejunum. This could be due to ileus or partial obstruction 4. Moderate amount of stool in the colon  with more pronounced stool distending the rectum and distal sigmoid colon. 5. Atheromatous coronary artery calcifications. 6. Minimal pericardial effusion inferiorly. 7. Marked diffuse pancreatic atrophy. 8. Normal appearing left pelvic transplant kidney. 9. Mild to moderate bilateral renal atrophy. Electronically Signed   By: Claudie Revering M.D.   On: 01/16/2019 18:47   Dg Chest Port 1 View  Result Date: 01/16/2019 CLINICAL DATA:  Patient found unresponsive this morning. EXAM: PORTABLE CHEST 1 VIEW COMPARISON:  Single-view of the chest 03/22/2017. PA and lateral chest 11/30/2014. FINDINGS: Lung volumes are slightly lower than on the comparison exams with mild crowding of the bronchovascular structures. Lungs are clear without consolidative process, pneumothorax or pleural effusion. Heart size is normal. No acute or focal bony abnormality. IMPRESSION: Negative chest. Electronically Signed   By: Inge Rise M.D.   On: 01/16/2019 11:46   STUDIES:  EEG pending  CULTURES: Blood cultures x2 Urine culture MRSA screen negative COVID-19 negative  ANTIBIOTICS: Cefepime Vancomycin-discontinued given negative MRSA screen We will add Flagyl to cover for aspiration pneumonitis  SIGNIFICANT EVENTS: 01/16/2019: Admitted  LINES/TUBES: Peripheral IVs Foley cath  DISCUSSION: 43 year old male with type 1 diabetes with a failed pancreatic transplant now on an insulin pump, kidney transplant hypertension who presents with acute encephalopathy initially felt to be due to hypoglycemia and possible seizure but now refractory with resolution of hypoglycemia and initiation of antiseizure therapy; multifocal pneumonia likely aspiration, constipation and now hyperglycemic.  ASSESSMENT  Multifocal pneumonia-COVID-19 negative Acute encephalopathy felt to be due to hypoglycemia and possible seizure activity Hypoglycemia-resolved and now in hyperglycemic state Rule out subclinical seizure activity Rule out GI  bleed-hemoglobin is stable with no hematemesis Constipation Nausea and vomiting Type 1 diabetes status post failed pancreatic transplant Status post kidney transplant Electrolyte abnormalities-hypokalemia, hypomagnesemia and hypophosphatemia   PLAN Neurology consulted and recommended Keppra; patient has been loaded with Keppra Consider MRI brain for persistent encephalopathy Continue to monitor neurologic status closely Blood glucose monitoring every 4 hours with sliding scale insulin coverage Basal coverage with Lantus 10 units at bedtime; first dose given last night EEG to rule out subclinical seizure activity Seizure precautions Continue Protonix infusion Trend hemoglobin and hematocrit IR consult for NG tube placement Keep n.p.o. until mental status improves Monitor fever curve Follow-up cultures Continue antibiotics as above Monitor and correct electrolytes Trend renal indices  Transfer to [facility] pending.   Best Practice: Code Status: Full code Diet: N.p.o. GI prophylaxis: Protonix infusion VTE prophylaxis: SCDs/no pharmacologic VTE prophylaxis until GI bleed is ruled out  FAMILY  - Updates: Family updated by ED and admitting service.  Will update with any new changes in treatment plan.  Geron Mulford S. Tukov-Yual ANP-BC Pulmonary and [place] Pager [phone] or [phone]  NB: This document was prepared using Dragon voice recognition software and may include unintentional dictation errors.    01/17/2019, 12:16 AM

## 2019-01-17 NOTE — Progress Notes (Signed)
Contacted by [facility] stated they do not have ground transportation availability at this time and ALS ground transport will not be available until midnight.  Therefore, asked to contact [facility] for Ground transportation availability.  I spoke with [facility] they stated they will likely have ground transportation for pt transfer to [place] [facility] transport updated, I informed them if [facility] unable to transport pt I will notify them.  Upon assessment pt able to verbalize now and follows commands, pupils remain unequal right pupil 2 mm sluggish/left pupil irregular and non reactive, vss, CBG 70 pt will receive 1/2 amp of D50W and will recheck CBG 15 minutes post administration.  Will continue to monitor and assess pt.  Marda Stalker, [place] Pager [phone] (please enter 7 digits) PCCM Consult Pager [phone] (please enter 7 digits)

## 2019-01-17 NOTE — Progress Notes (Signed)
[facility] [place] NAME: Gabriel Huff    MR#:  [id number]  DATE OF BIRTH:  [birth year]  SUBJECTIVE: Admitted for altered mental status, hypoglycemia, possible seizure  CHIEF COMPLAINT:   Chief Complaint  Patient presents with  . Altered Mental Status  Patient still unresponsive.  REVIEW OF SYSTEMS:   Review of Systems  Unable to perform ROS: Mental acuity     DRUG ALLERGIES:   Allergies  Allergen Reactions  . Losartan Swelling and Other (See Comments)    Reaction:  Feet/ankle swelling   . Oxycodone Nausea Only    VITALS:  Blood pressure (!) 121/59, pulse 100, temperature 99.5 F (37.5 C), temperature source Axillary, resp. rate 18, weight 80.8 kg, SpO2 99 %.  PHYSICAL EXAMINATION:  GENERAL:  43 y.o.-year-old patient lying in the bed unresponsive, patient can be awakened with stimulation but does not maintain wakefulness as per my discussion with nurse.[name] EYES: Pupils equal, round, reactive to light No scleral icterus. Extraocular muscles intact.  HEENT: Head atraumatic, normocephalic. Oropharynx and nasopharynx clear.  NECK:  Supple, no jugular venous distention. No thyroid enlargement, no tenderness.  LUNGS: Normal breath sounds bilaterally, no wheezing, rales,rhonchi or crepitation. No use of accessory muscles of respiration.  CARDIOVASCULAR: S1, S2 normal. No murmurs, rubs, or gallops.  ABDOMEN: Soft, nontender, nondistended. Bowel sounds present. No organomegaly or mass.  EXTREMITIES: No pedal edema, cyanosis, or clubbing.  NEUROLOGIC: Unresponsive, not able to test for neuro exam.  Tone is normal throughout. PSYCHIATRIC unresponsive.    No rash.[name]    LABORATORY PANEL:   CBC Recent Labs  Lab 01/17/19 0546  WBC 16.0*  HGB 11.3*  HCT 34.0*  PLT 198   ------------------------------------------------------------------------------------------------------------------  Chemistries  Recent Labs  Lab  01/17/19 0546  NA 143  K 4.0  CL 111  CO2 23  GLUCOSE 159*  BUN 23*  CREATININE 1.04  CALCIUM 8.9  MG 1.6*  AST 26  ALT 27  ALKPHOS 58  BILITOT 2.4*   ------------------------------------------------------------------------------------------------------------------  Cardiac Enzymes No results for input(s): TROPONINI in the last 168 hours. ------------------------------------------------------------------------------------------------------------------  RADIOLOGY:  Ct Head Wo Contrast  Result Date: 01/16/2019 CLINICAL DATA:  PT found down. Unknown down time.  Pt with hx of DM EXAM: CT HEAD WITHOUT CONTRAST CT CERVICAL SPINE WITHOUT CONTRAST TECHNIQUE: Multidetector CT imaging of the head and cervical spine was performed following the standard protocol without intravenous contrast. Multiplanar CT image reconstructions of the cervical spine were also generated. COMPARISON:  None. FINDINGS: CT HEAD FINDINGS Brain: Prominent cisterna magna or posterior fossa arachnoid cyst. There is no other intra or extra-axial fluid collection or mass lesion. The basilar cisterns and ventricles have a normal appearance. There is no CT evidence for acute infarction or hemorrhage. Vascular: No hyperdense vessel or unexpected calcification. Skull: Normal. Negative for fracture or focal lesion. Sinuses/Orbits: Air-fluid levels within the LEFT maxillary and sphenoid sinuses. Significant mucosal thickening of the paranasal sinuses. Mastoid air cells are normally aerated. Orbits are unremarkable. No acute sinus wall fractures identified. Other: None. CT CERVICAL SPINE FINDINGS Alignment: Normal. Skull base and vertebrae: No acute fracture. No primary bone lesion or focal pathologic process. Soft tissues and spinal canal: No prevertebral fluid or swelling. No visible canal hematoma. Disc levels:  Unremarkable. Upper chest: Partially imaged ground-glass density in the posterior LEFT UPPER lobe. Other: None IMPRESSION: 1.  No evidence for acute intracranial abnormality. 2. Prominent cisterna magna or posterior fossa arachnoid cyst. 3. Acute  and chronic sinusitis. 4. No evidence for acute cervical spine abnormality. 5. Partially imaged ground-glass density in the posterior LEFT UPPER lobe. Consider further evaluation with chest x-ray. Electronically Signed   By: Nolon Nations M.D.   On: 01/16/2019 12:59   Ct Chest W Contrast  Result Date: 01/16/2019 CLINICAL DATA:  Patient found down. Ground-glass opacity in the posterior left upper lobe on a cervical spine CT obtained earlier today. Hematemesis. History of kidney transplant. EXAM: CT CHEST, ABDOMEN, AND PELVIS WITH CONTRAST TECHNIQUE: Multidetector CT imaging of the chest, abdomen and pelvis was performed following the standard protocol during bolus administration of intravenous contrast. CONTRAST:  183mL OMNIPAQUE IOHEXOL 300 MG/ML  SOLN COMPARISON:  Cervical spine CT and chest radiograph obtained earlier today. FINDINGS: CT CHEST FINDINGS Cardiovascular: Atheromatous coronary artery calcification. Normal sized heart. Minimal pericardial effusion inferiorly with a maximum thickness of 12 mm. Mediastinum/Nodes: No enlarged mediastinal, hilar, or axillary lymph nodes. Thyroid gland, trachea, and esophagus demonstrate no significant findings. Lungs/Pleura: Large number of small, patchy alveolar opacities in the left upper lobe, left lower lobe and right lower lobe. This has a somewhat nodular pattern and is most confluent in the posterolateral aspect of the left upper lobe. No pleural fluid. Musculoskeletal: Unremarkable bones. CT ABDOMEN PELVIS FINDINGS Hepatobiliary: Anterior right lobe liver cyst. Normal appearing gallbladder. Pancreas: Marked diffuse pancreatic atrophy. Spleen: Normal in size without focal abnormality. Adrenals/Urinary Tract: Mild to moderate bilateral renal atrophy and vascular calcifications. Normal appearing left pelvic transplant kidney. Unremarkable  urinary bladder and ureters. Stomach/Bowel: Mildly dilated stomach. Mild and moderate dilatation of multiple loops of jejunum. Normal caliber distal small bowel. Rectum and distal sigmoid colon distended by stool. Moderate amount of stool elsewhere in the colon. Vascular/Lymphatic: No significant vascular findings are present. No enlarged abdominal or pelvic lymph nodes. Reproductive: Prostate is unremarkable. Other: Oval soft tissue and lower density surrounding a curvilinear calcific density in the mesentery between bowel loops in the left mid abdomen, anteriorly. This measures 2.4 x 1.4 cm on axial image number 79 series 2 and 1.9 cm in length on coronal image number 37. This is also seen on sagittal image number 102. There is an additional curvilinear density slightly more superiorly in the midline. The patient had a peritoneal dialysis catheter placed on 12/06/2014, not currently demonstrated. Musculoskeletal: Unremarkable bones. IMPRESSION: 1. Bilateral multilobar pneumonia with a nodular configuration, involving the left upper lobe, left lower lobe and right lower lobe. This has an appearance suggesting infection with an atypical organism. 2. 2.4 x 1.4 x 1.9 cm curvilinear soft tissue and lower density surrounding a curvilinear calcific density in the mesentery in the left mid abdomen. This may represent a peritoneal dialysis catheter fragment with surrounding foreign body reaction. A primary neoplasm such as a carcinoid tumor is less likely. There is an additional nearby possible catheter fragment. 3. Mild and moderate dilatation of multiple loops of jejunum. This could be due to ileus or partial obstruction 4. Moderate amount of stool in the colon with more pronounced stool distending the rectum and distal sigmoid colon. 5. Atheromatous coronary artery calcifications. 6. Minimal pericardial effusion inferiorly. 7. Marked diffuse pancreatic atrophy. 8. Normal appearing left pelvic transplant kidney. 9. Mild  to moderate bilateral renal atrophy. Electronically Signed   By: Claudie Revering M.D.   On: 01/16/2019 18:47   Ct Cervical Spine Wo Contrast  Result Date: 01/16/2019 CLINICAL DATA:  PT found down. Unknown down time.  Pt with hx of DM EXAM: CT HEAD WITHOUT  CONTRAST CT CERVICAL SPINE WITHOUT CONTRAST TECHNIQUE: Multidetector CT imaging of the head and cervical spine was performed following the standard protocol without intravenous contrast. Multiplanar CT image reconstructions of the cervical spine were also generated. COMPARISON:  None. FINDINGS: CT HEAD FINDINGS Brain: Prominent cisterna magna or posterior fossa arachnoid cyst. There is no other intra or extra-axial fluid collection or mass lesion. The basilar cisterns and ventricles have a normal appearance. There is no CT evidence for acute infarction or hemorrhage. Vascular: No hyperdense vessel or unexpected calcification. Skull: Normal. Negative for fracture or focal lesion. Sinuses/Orbits: Air-fluid levels within the LEFT maxillary and sphenoid sinuses. Significant mucosal thickening of the paranasal sinuses. Mastoid air cells are normally aerated. Orbits are unremarkable. No acute sinus wall fractures identified. Other: None. CT CERVICAL SPINE FINDINGS Alignment: Normal. Skull base and vertebrae: No acute fracture. No primary bone lesion or focal pathologic process. Soft tissues and spinal canal: No prevertebral fluid or swelling. No visible canal hematoma. Disc levels:  Unremarkable. Upper chest: Partially imaged ground-glass density in the posterior LEFT UPPER lobe. Other: None IMPRESSION: 1. No evidence for acute intracranial abnormality. 2. Prominent cisterna magna or posterior fossa arachnoid cyst. 3. Acute and chronic sinusitis. 4. No evidence for acute cervical spine abnormality. 5. Partially imaged ground-glass density in the posterior LEFT UPPER lobe. Consider further evaluation with chest x-ray. Electronically Signed   By: Nolon Nations M.D.    On: 01/16/2019 12:59   Mr Brain Wo Contrast  Result Date: 01/16/2019 CLINICAL DATA:  Neuro deficits, subacute. Personal history of diabetes and kidney transplant with altered mental status. Hypoglycemia. EXAM: MRI HEAD WITHOUT CONTRAST TECHNIQUE: Multiplanar, multiecho pulse sequences of the brain and surrounding structures were obtained without intravenous contrast. COMPARISON:  None. FINDINGS: Brain: Focal restricted diffusion is present in the posterior hippocampus bilaterally. No other significant restricted diffusion is present. T2 signal changes accompany the diffusion changes. No acute hemorrhage or mass lesion is present. No significant white matter lesions are present. The ventricles are of normal size. No significant extraaxial fluid collection is present. The brainstem and cerebellum are within normal limits. Mega cisterna magna is noted. Vascular: Flow is present in the major intracranial arteries. Skull and upper cervical spine: The craniocervical junction is normal. Upper cervical spine is within normal limits. Marrow signal is unremarkable. Sinuses/Orbits: Fluid level is present in the left maxillary sinus. Mild mucosal thickening is present in the right maxillary sinus. There is mild mucosal thickening in the anterior ethmoid air cells bilaterally. Fluid level is present in the right sphenoid sinus. Bilateral lens replacements are noted. Globes and orbits are otherwise unremarkable. IMPRESSION: 1. Restricted diffusion and T2 signal changes within the posterior hippocampus bilaterally, consistent with hypoglycemic encephalopathy. No other focal areas are involved. 2. Otherwise normal MRI appearance the brain. 3. Sinus disease Electronically Signed   By: San Morelle M.D.   On: 01/16/2019 20:31   Ct Abdomen Pelvis W Contrast  Result Date: 01/16/2019 CLINICAL DATA:  Patient found down. Ground-glass opacity in the posterior left upper lobe on a cervical spine CT obtained earlier today.  Hematemesis. History of kidney transplant. EXAM: CT CHEST, ABDOMEN, AND PELVIS WITH CONTRAST TECHNIQUE: Multidetector CT imaging of the chest, abdomen and pelvis was performed following the standard protocol during bolus administration of intravenous contrast. CONTRAST:  129mL OMNIPAQUE IOHEXOL 300 MG/ML  SOLN COMPARISON:  Cervical spine CT and chest radiograph obtained earlier today. FINDINGS: CT CHEST FINDINGS Cardiovascular: Atheromatous coronary artery calcification. Normal sized heart. Minimal pericardial effusion inferiorly  with a maximum thickness of 12 mm. Mediastinum/Nodes: No enlarged mediastinal, hilar, or axillary lymph nodes. Thyroid gland, trachea, and esophagus demonstrate no significant findings. Lungs/Pleura: Large number of small, patchy alveolar opacities in the left upper lobe, left lower lobe and right lower lobe. This has a somewhat nodular pattern and is most confluent in the posterolateral aspect of the left upper lobe. No pleural fluid. Musculoskeletal: Unremarkable bones. CT ABDOMEN PELVIS FINDINGS Hepatobiliary: Anterior right lobe liver cyst. Normal appearing gallbladder. Pancreas: Marked diffuse pancreatic atrophy. Spleen: Normal in size without focal abnormality. Adrenals/Urinary Tract: Mild to moderate bilateral renal atrophy and vascular calcifications. Normal appearing left pelvic transplant kidney. Unremarkable urinary bladder and ureters. Stomach/Bowel: Mildly dilated stomach. Mild and moderate dilatation of multiple loops of jejunum. Normal caliber distal small bowel. Rectum and distal sigmoid colon distended by stool. Moderate amount of stool elsewhere in the colon. Vascular/Lymphatic: No significant vascular findings are present. No enlarged abdominal or pelvic lymph nodes. Reproductive: Prostate is unremarkable. Other: Oval soft tissue and lower density surrounding a curvilinear calcific density in the mesentery between bowel loops in the left mid abdomen, anteriorly. This  measures 2.4 x 1.4 cm on axial image number 79 series 2 and 1.9 cm in length on coronal image number 37. This is also seen on sagittal image number 102. There is an additional curvilinear density slightly more superiorly in the midline. The patient had a peritoneal dialysis catheter placed on 12/06/2014, not currently demonstrated. Musculoskeletal: Unremarkable bones. IMPRESSION: 1. Bilateral multilobar pneumonia with a nodular configuration, involving the left upper lobe, left lower lobe and right lower lobe. This has an appearance suggesting infection with an atypical organism. 2. 2.4 x 1.4 x 1.9 cm curvilinear soft tissue and lower density surrounding a curvilinear calcific density in the mesentery in the left mid abdomen. This may represent a peritoneal dialysis catheter fragment with surrounding foreign body reaction. A primary neoplasm such as a carcinoid tumor is less likely. There is an additional nearby possible catheter fragment. 3. Mild and moderate dilatation of multiple loops of jejunum. This could be due to ileus or partial obstruction 4. Moderate amount of stool in the colon with more pronounced stool distending the rectum and distal sigmoid colon. 5. Atheromatous coronary artery calcifications. 6. Minimal pericardial effusion inferiorly. 7. Marked diffuse pancreatic atrophy. 8. Normal appearing left pelvic transplant kidney. 9. Mild to moderate bilateral renal atrophy. Electronically Signed   By: Claudie Revering M.D.   On: 01/16/2019 18:47   Dg Chest Port 1 View  Result Date: 01/16/2019 CLINICAL DATA:  Patient found unresponsive this morning. EXAM: PORTABLE CHEST 1 VIEW COMPARISON:  Single-view of the chest 03/22/2017. PA and lateral chest 11/30/2014. FINDINGS: Lung volumes are slightly lower than on the comparison exams with mild crowding of the bronchovascular structures. Lungs are clear without consolidative process, pneumothorax or pleural effusion. Heart size is normal. No acute or focal bony  abnormality. IMPRESSION: Negative chest. Electronically Signed   By: Inge Rise M.D.   On: 01/16/2019 11:46    EKG:   Orders placed or performed during the hospital encounter of 01/16/19  . EKG 12-Lead  . EKG 12-Lead  . ED EKG 12-Lead  . 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 1.  Altered mental status with bowel and bladder incontinence,  unable to exclude seizures, patient is on Keppra right now patient needs longer EEG monitoring, patient is on wait list for [facility] transfer but they do not have beds, patient is on Keppra right now,  # sepsis with multifocal pneumonia with elevated procalcitonin, leukocytosis,, COVID-19 test negative, chest x-ray is clear, does not have UTI.  CT chest showed bilateral multilobar pneumonia continue broad-spectrum antibiotics for now including cefepime,, received a dose of vancomycin.  Flagyl.  3 history of diabetes mellitus type 2, continue low-dose Lantus.,  Sliding scale insulin with coverage..  Patient on insulin pump at home, had history of pancreas transplant , Renal transplant, patient is supposed to be on tacrolimus, prednisone  All the records are reviewed and case discussed with Care Management/Social Workerr. Management plans discussed with the patient, family and they are in agreement.  CODE STATUS: Full code  TOTAL TIME TAKING CARE OF THIS PATIENT: 30 minutes.   Waiting for transfer to [place], on wait list.   Epifanio Lesches M.D on 01/17/2019 at 12:48 PM  Between 7am to 6pm - Pager - [phone]  After 6pm go to [email or web address] - password [email or web address] [facility]  Office  [phone]  CC: Primary care physician; Kathrine Haddock, NP   Note: This dictation was prepared with Dragon dictation  along with smaller phrase technology. Any transcriptional errors that result from this process are unintentional.

## 2019-01-17 NOTE — Procedures (Signed)
ELECTROENCEPHALOGRAM REPORT   Patient: Gabriel MANISCALCO Sr.       Room #: IC14A-AA EEG No. ID: [id number] Age: 43 y.o.        Sex: male Referring Physician: Kasa Report Date:  01/17/2019        Interpreting Physician: Alexis Goodell  History: Gabriel Beams Fiske Sr. is an 43 y.o. male found down with altered mental status  Medications:  Insulin, Keppra, Zosyn, Protonix  Conditions of Recording:  This is a 21 channel routine scalp EEG performed with bipolar and monopolar montages arranged in accordance to the international 10/20 system of electrode placement. One channel was dedicated to EKG recording.  The patient is in the altered state.  Description:  The background activity is slow and poorly organized.  It consists of a low voltage polymorphic delta activity at 2-3 Hz that is diffusely distributed.  This activity is continuous for the most part with some rare short periods when an alpha posterior background rhythm can be appreciated.  No epileptiform activity is noted.  Hyperventilation and intermittent photic stimulation were not performed.   IMPRESSION: This is an abnormal EEG secondary to general background slowing.  This finding may be seen with a diffuse disturbance that is etiologically nonspecific, but may include a metabolic encephalopathy, among other possibilities.  No epileptiform activity was noted.     Alexis Goodell, MD Neurology [phone] 01/17/2019, 3:15 PM

## 2019-01-18 LAB — BPAM RBC
Blood Product Expiration Date: 202010112359
Blood Product Expiration Date: 202010112359
Unit Type and Rh: 5100
Unit Type and Rh: 5100

## 2019-01-18 LAB — HIV ANTIBODY (ROUTINE TESTING W REFLEX): HIV Screen 4th Generation wRfx: NONREACTIVE

## 2019-01-18 LAB — GLUCOSE, CAPILLARY: Glucose-Capillary: 329 mg/dL — ABNORMAL HIGH (ref 70–99)

## 2019-01-18 LAB — TYPE AND SCREEN
ABO/RH(D): O POS
Antibody Screen: NEGATIVE
Unit division: 0
Unit division: 0

## 2019-01-18 LAB — PREPARE RBC (CROSSMATCH)

## 2019-01-21 LAB — CULTURE, BLOOD (ROUTINE X 2)
Culture: NO GROWTH
Culture: NO GROWTH
Special Requests: ADEQUATE
Special Requests: ADEQUATE

## 2019-02-14 ENCOUNTER — Telehealth: Payer: Self-pay | Admitting: Unknown Physician Specialty

## 2019-02-14 NOTE — Telephone Encounter (Signed)
Gabriel Huff, with [facility] calling to request VO for speech eval.

## 2019-02-14 NOTE — Telephone Encounter (Signed)
Provided verbal order to [facility] for speech evaluation.  I am in meetings all next week, could possibly move up to 19th or 20th if needed.  Would need 30 minutes though as have not met them and post hospital.

## 2019-02-14 NOTE — Telephone Encounter (Signed)
Spoke with Cletis Athens and Pieter Partridge, pts dad and pt is scheduled for 10/23. Is there any way pt can get in sooner so they can get eval?

## 2019-02-25 LAB — HM DIABETES EYE EXAM

## 2019-03-04 ENCOUNTER — Ambulatory Visit (INDEPENDENT_AMBULATORY_CARE_PROVIDER_SITE_OTHER): Payer: Medicare Other | Admitting: *Deleted

## 2019-03-04 ENCOUNTER — Encounter: Payer: Self-pay | Admitting: Nurse Practitioner

## 2019-03-04 ENCOUNTER — Ambulatory Visit (INDEPENDENT_AMBULATORY_CARE_PROVIDER_SITE_OTHER): Payer: Medicare Other | Admitting: Nurse Practitioner

## 2019-03-04 ENCOUNTER — Other Ambulatory Visit: Payer: Self-pay

## 2019-03-04 VITALS — BP 135/89 | HR 81 | Temp 97.8°F | Ht 66.4 in | Wt 145.8 lb

## 2019-03-04 DIAGNOSIS — D849 Immunodeficiency, unspecified: Secondary | ICD-10-CM | POA: Diagnosis not present

## 2019-03-04 DIAGNOSIS — E1069 Type 1 diabetes mellitus with other specified complication: Secondary | ICD-10-CM | POA: Insufficient documentation

## 2019-03-04 DIAGNOSIS — Z940 Kidney transplant status: Secondary | ICD-10-CM

## 2019-03-04 DIAGNOSIS — I10 Essential (primary) hypertension: Secondary | ICD-10-CM

## 2019-03-04 DIAGNOSIS — E10649 Type 1 diabetes mellitus with hypoglycemia without coma: Secondary | ICD-10-CM

## 2019-03-04 DIAGNOSIS — G9340 Encephalopathy, unspecified: Secondary | ICD-10-CM

## 2019-03-04 DIAGNOSIS — E1159 Type 2 diabetes mellitus with other circulatory complications: Secondary | ICD-10-CM | POA: Diagnosis not present

## 2019-03-04 DIAGNOSIS — E785 Hyperlipidemia, unspecified: Secondary | ICD-10-CM

## 2019-03-04 NOTE — Assessment & Plan Note (Signed)
Chronic, ongoing.  Continue statin daily and adjust dose as needed.  Obtain lipid panel next visit, along with CMP.

## 2019-03-04 NOTE — Assessment & Plan Note (Addendum)
Chronic, ongoing with insulin pump in place and recent hospitalization for hypoglycemia.  Continue collaboration with Dr. Honor Junes with endocrinology.  Will place referral to CCM team for assistance in ensuring care between providers coordinated and assistance with medication regimen.  Return in 6 weeks.

## 2019-03-04 NOTE — Patient Instructions (Signed)
Carbohydrate Counting for Diabetes Mellitus, Adult  Carbohydrate counting is a method of keeping track of how many carbohydrates you eat. Eating carbohydrates naturally increases the amount of sugar (glucose) in the blood. Counting how many carbohydrates you eat helps keep your blood glucose within normal limits, which helps you manage your diabetes (diabetes mellitus). It is important to know how many carbohydrates you can safely have in each meal. This is different for every person. A diet and nutrition specialist (registered dietitian) can help you make a meal plan and calculate how many carbohydrates you should have at each meal and snack. Carbohydrates are found in the following foods:  Grains, such as breads and cereals.  Dried beans and soy products.  Starchy vegetables, such as potatoes, peas, and corn.  Fruit and fruit juices.  Milk and yogurt.  Sweets and snack foods, such as cake, cookies, candy, chips, and soft drinks. How do I count carbohydrates? There are two ways to count carbohydrates in food. You can use either of the methods or a combination of both. Reading "Nutrition Facts" on packaged food The "Nutrition Facts" list is included on the labels of almost all packaged foods and beverages in the [place] It includes:  The serving size.  Information about nutrients in each serving, including the grams (g) of carbohydrate per serving. To use the "Nutrition Facts":  Decide how many servings you will have.  Multiply the number of servings by the number of carbohydrates per serving.  The resulting number is the total amount of carbohydrates that you will be having. Learning standard serving sizes of other foods When you eat carbohydrate foods that are not packaged or do not include "Nutrition Facts" on the label, you need to measure the servings in order to count the amount of carbohydrates:  Measure the foods that you will eat with a food scale or measuring cup, if needed.   Decide how many standard-size servings you will eat.  Multiply the number of servings by 15. Most carbohydrate-rich foods have about 15 g of carbohydrates per serving. ? For example, if you eat 8 oz (170 g) of strawberries, you will have eaten 2 servings and 30 g of carbohydrates (2 servings x 15 g = 30 g).  For foods that have more than one food mixed, such as soups and casseroles, you must count the carbohydrates in each food that is included. The following list contains standard serving sizes of common carbohydrate-rich foods. Each of these servings has about 15 g of carbohydrates:   hamburger bun or  English muffin.   oz (15 mL) syrup.   oz (14 g) jelly.  1 slice of bread.  1 six-inch tortilla.  3 oz (85 g) cooked rice or pasta.  4 oz (113 g) cooked dried beans.  4 oz (113 g) starchy vegetable, such as peas, corn, or potatoes.  4 oz (113 g) hot cereal.  4 oz (113 g) mashed potatoes or  of a large baked potato.  4 oz (113 g) canned or frozen fruit.  4 oz (120 mL) fruit juice.  4-6 crackers.  6 chicken nuggets.  6 oz (170 g) unsweetened dry cereal.  6 oz (170 g) plain fat-free yogurt or yogurt sweetened with artificial sweeteners.  8 oz (240 mL) milk.  8 oz (170 g) fresh fruit or one small piece of fruit.  24 oz (680 g) popped popcorn. Example of carbohydrate counting Sample meal  3 oz (85 g) chicken breast.  6 oz (170 g)   brown rice.  4 oz (113 g) corn.  8 oz (240 mL) milk.  8 oz (170 g) strawberries with sugar-free whipped topping. Carbohydrate calculation 1. Identify the foods that contain carbohydrates: ? Rice. ? Corn. ? Milk. ? Strawberries. 2. Calculate how many servings you have of each food: ? 2 servings rice. ? 1 serving corn. ? 1 serving milk. ? 1 serving strawberries. 3. Multiply each number of servings by 15 g: ? 2 servings rice x 15 g = 30 g. ? 1 serving corn x 15 g = 15 g. ? 1 serving milk x 15 g = 15 g. ? 1 serving  strawberries x 15 g = 15 g. 4. Add together all of the amounts to find the total grams of carbohydrates eaten: ? 30 g + 15 g + 15 g + 15 g = 75 g of carbohydrates total. Summary  Carbohydrate counting is a method of keeping track of how many carbohydrates you eat.  Eating carbohydrates naturally increases the amount of sugar (glucose) in the blood.  Counting how many carbohydrates you eat helps keep your blood glucose within normal limits, which helps you manage your diabetes.  A diet and nutrition specialist (registered dietitian) can help you make a meal plan and calculate how many carbohydrates you should have at each meal and snack. This information is not intended to replace advice given to you by your health care provider. Make sure you discuss any questions you have with your health care provider. Document Released: 04/28/2005 Document Revised: 11/20/2016 Document Reviewed: 10/10/2015 Elsevier Patient Education  2020 Elsevier Inc.  

## 2019-03-04 NOTE — Progress Notes (Signed)
New Patient Office Visit  Subjective:  Patient ID: Gabriel Huff., male    DOB: 1976/03/29  Age: 43 y.o. MRN: [id number]  CC:  Chief Complaint  Patient presents with  . Establish Care  . Diabetes    HPI Gabriel Deichert Pollina Sr. presents for new patient visit to re-establish care.  Introduced to Designer, jewellery role and practice setting.  All questions answered.  DIABETES Currently has insulin pump in place and is followed by Dr. Honor Junes.  Insulin pump Medtronic 630G, Novolog can go to 50 units/day.  Is working on [employer] 2 to monitor sugars.  Was hospitalized in September due to low BS, 40 when EMS arrived.  Was told pump did not work correctly, currently has new pump.  August A1C was 8.4%.  Has history of kidney transplant 03/18/2016, which was successful.  At same time had pancreas transplant which did not take.  Is followed by [facility] transplant team and they are working on setting up for another pancreas transplant.   Hypoglycemic episodes:no Polydipsia/polyuria: no Visual disturbance: no Chest pain: no Paresthesias: no Glucose Monitoring: yes  Accucheck frequency: TID  Fasting glucose:  Post prandial:  Evening:  Before meals: Taking Insulin?: yes  Long acting insulin:  Short acting insulin: Blood Pressure Monitoring: daily Retinal Examination: Up to Date Foot Exam: Up to Date Pneumovax: Up to Date Influenza: Up to Date Aspirin: no   HYPERTENSION / HYPERLIPIDEMIA Not currently taking BP medication, but is on Lipitor for cholesterol. Satisfied with current treatment? yes Duration of hypertension: chronic BP monitoring frequency: a few times a day BP range: 120-130/70's BP medication side effects: no Duration of hyperlipidemia: chronic Cholesterol medication side effects: no Cholesterol supplements: none Medication compliance: good compliance Aspirin: no Recent stressors: no Recurrent headaches: no Visual changes: no Palpitations: no Dyspnea: no Chest  pain: no Lower extremity edema: no Dizzy/lightheaded: no   ENCEPHALOPATHY: During recent hospitalization was noted to have restricted diffusion in the hippocampus bilaterally on MRI with hypoglycemic encephalopathy.  On review notes he was found unresponsive at home due to severe hypoglycemia, potentially due to insulin pump malfunction.  Was noted to have some minor short-term memory deficits afterwards. Has a nurse come twice a week to help him work on his memory, reads back to her.  Has been working with her since he came out of [place].  He endorses increase difficulty with memory since hospital discharge, but feels outpatient nursing is helping with this.   Past Medical History:  Diagnosis Date  . Cataract    bilateral  . End stage renal disease ([place])    peritoneal dialysis since 01/2015  . Hypertension   . Type 1 diabetes [facility])     Past Surgical History:  Procedure Laterality Date  . CAPD INSERTION N/A 12/06/2014   Procedure: LAPAROSCOPIC INSERTION CONTINUOUS AMBULATORY PERITONEAL DIALYSIS  (CAPD) CATHETER;  Surgeon: Katha Cabal, MD;  Location: [facility] ORS;  Service: Vascular;  Laterality: N/A;  . CATARACT EXTRACTION W/ INTRAOCULAR LENS IMPLANT Bilateral 2015  . CYST REMOVAL TRUNK    . GYNECOMASTIA EXCISION  1993  . KIDNEY TRANSPLANT      Family History  Problem Relation Age of Onset  . Diabetes Mother     Social History   Socioeconomic History  . Marital status: Single    Spouse name: Not on file  . Number of children: Not on file  . Years of education: Not on file  . Highest education level: Not on file  Occupational History  . Not on file  Social Needs  . Financial resource strain: Not very hard  . Food insecurity    Worry: Never true    Inability: Never true  . Transportation needs    Medical: No    Non-medical: No  Tobacco Use  . Smoking status: Never Smoker  . Smokeless tobacco: Never Used  Substance and Sexual Activity  . Alcohol use: No     Alcohol/week: 0.0 standard drinks  . Drug use: No  . Sexual activity: Never  Lifestyle  . Physical activity    Days per week: 0 days    Minutes per session: 0 min  . Stress: Only a little  Relationships  . Social Herbalist on phone: Once a week    Gets together: Once a week    Attends religious service: Never    Active member of club or organization: No    Attends meetings of clubs or organizations: Never    Relationship status: Not on file  . Intimate partner violence    Fear of current or ex partner: No    Emotionally abused: No    Physically abused: No    Forced sexual activity: No  Other Topics Concern  . Not on file  Social History Narrative  . Not on file    ROS Review of Systems  Constitutional: Negative for activity change, diaphoresis, fatigue and fever.  Respiratory: Negative for cough, chest tightness, shortness of breath and wheezing.   Cardiovascular: Negative for chest pain, palpitations and leg swelling.  Gastrointestinal: Negative for abdominal distention, abdominal pain, constipation, diarrhea, nausea and vomiting.  Endocrine: Negative for cold intolerance, heat intolerance, polydipsia, polyphagia and polyuria.  Neurological: Negative for dizziness, syncope, weakness, light-headedness, numbness and headaches.  Psychiatric/Behavioral: Negative.     Objective:   Today's Vitals: BP 135/89   Pulse 81   Temp 97.8 F (36.6 C) (Oral)   Ht 5' 6.4" (1.687 m)   Wt 145 lb 12.8 oz (66.1 kg)   SpO2 98%   BMI 23.25 kg/m   Physical Exam Vitals signs and nursing note reviewed.  Constitutional:      General: He is awake. He is not in acute distress.    Appearance: He is well-developed. He is not ill-appearing.  HENT:     Head: Normocephalic and atraumatic.     Right Ear: Hearing normal. No drainage.     Left Ear: Hearing normal. No drainage.  Eyes:     General: Lids are normal.        Right eye: No discharge.        Left eye: No discharge.      Conjunctiva/sclera: Conjunctivae normal.     Pupils: Pupils are equal, round, and reactive to light.  Neck:     Musculoskeletal: Normal range of motion and neck supple.     Thyroid: No thyromegaly.     Vascular: No carotid bruit.  Cardiovascular:     Rate and Rhythm: Normal rate and regular rhythm.     Heart sounds: Normal heart sounds, S1 normal and S2 normal. No murmur. No gallop.   Pulmonary:     Effort: Pulmonary effort is normal. No accessory muscle usage or respiratory distress.     Breath sounds: Normal breath sounds.  Abdominal:     General: Bowel sounds are normal.     Palpations: Abdomen is soft.  Musculoskeletal: Normal range of motion.     Right lower leg: No edema.  Left lower leg: No edema.  Skin:    General: Skin is warm and dry.     Capillary Refill: Capillary refill takes less than 2 seconds.  Neurological:     Mental Status: He is alert and oriented to person, place, and time.     Deep Tendon Reflexes: Reflexes are normal and symmetric.  Psychiatric:        Mood and Affect: Mood normal.        Behavior: Behavior normal. Behavior is cooperative.        Thought Content: Thought content normal.        Judgment: Judgment normal.     Assessment & Plan:   Problem List Items Addressed This Visit      Cardiovascular and Mediastinum   Hypertension associated with diabetes ([place])    Chronic, stable without medication.  Continue to monitor and add on medication as needed based on BP readings.  Home BP below goal, continue to monitor regularly at home.      Relevant Medications   insulin aspart (NOVOLOG) 100 UNIT/ML injection   atorvastatin (LIPITOR) 20 MG tablet   GLUCAGON EMERGENCY 1 MG injection     Endocrine   Type I diabetes mellitus, uncontrolled ([place]) - Primary    Chronic, ongoing with insulin pump in place and recent hospitalization for hypoglycemia.  Continue collaboration with Dr. Honor Junes with endocrinology.  Will place referral to CCM team for  assistance in ensuring care between providers coordinated and assistance with medication regimen.  Return in 6 weeks.      Relevant Medications   insulin aspart (NOVOLOG) 100 UNIT/ML injection   atorvastatin (LIPITOR) 20 MG tablet   GLUCAGON EMERGENCY 1 MG injection   Other Relevant Orders   Referral to Chronic Care Management Services   Hyperlipidemia due to type 1 diabetes mellitus (HCC)    Chronic, ongoing.  Continue statin daily and adjust dose as needed.  Obtain lipid panel next visit, along with CMP.      Relevant Medications   insulin aspart (NOVOLOG) 100 UNIT/ML injection   atorvastatin (LIPITOR) 20 MG tablet   GLUCAGON EMERGENCY 1 MG injection   Other Relevant Orders   Referral to Chronic Care Management Services     Nervous and Auditory   Acute encephalopathy    Improved with continued memory difficulty. Continue with home nursing and will involve CCM team in patient care.        Other   History of kidney transplant    Followed by [facility] transplant team, continue this collaboration.       Relevant Medications   PREDNISONE PO   TACROLIMUS PO   Mycophenolate Sodium (MYFORTIC PO)   Other Relevant Orders   Referral to Chronic Care Management Services   Immunosuppression [facility])    Followed by [facility] transplant team, continue this collaboration and will involve CCM team for any needs.         Outpatient Encounter Medications as of 03/04/2019  Medication Sig  . Ascorbic Acid (VITAMIN C) 100 MG tablet Take 100 mg by mouth daily.  [name] atorvastatin (LIPITOR) 20 MG tablet Take 20 mg by mouth daily.  [name] GLUCAGON EMERGENCY 1 MG injection INJECT 1 ML INTO MUSCLE AS DIRECTED FOR LOW BLOOD SUGAR (PER HYPOGLYCEMIA PROTOCOL)FOR UP TO 2 DOSES  . insulin aspart (NOVOLOG) 100 UNIT/ML injection 8 units with breakfast, 8 units with lunch and 7 units with dinner, correction scale up to 5 units with meals  . Multiple Vitamins-Minerals (MULTIVITAMIN WITH MINERALS)  tablet Take 1 tablet by  mouth daily.  . Mycophenolate Sodium (MYFORTIC PO) Take 2 tablets by mouth 2 (two) times daily.  [name] PREDNISONE PO Take 1 tablet by mouth daily. Pt unsure of dose  . TACROLIMUS PO Take 3 tablets by mouth daily.  [name] levETIRAcetam (KEPRRA) 500 MG/100ML SOLN Inject 100 mLs (500 mg total) into the vein every 12 (twelve) hours. (Patient not taking: Reported on 03/04/2019)  . piperacillin-tazobactam (ZOSYN) 3.375 GM/50ML IVPB Inject 50 mLs (3.375 g total) into the vein every 8 (eight) hours. (Patient not taking: Reported on 03/04/2019)   No facility-administered encounter medications on file as of 03/04/2019.     Follow-up: Return in about 6 weeks (around 04/15/2019) for T1DM, HTN/HLD.   [name], NP

## 2019-03-04 NOTE — Chronic Care Management (AMB) (Signed)
Chronic Care Management   Initial Visit Note  03/04/2019 Name: Gabriel Pigeon Popiel Sr. MRN: [id number] DOB: 1976-03-18  Referred by: Marnee Guarneri, DNP Reason for referral : Chronic Care Management (Diabetes type1 )   Gabriel Beams Abarca Sr. is a 43 y.o. year old male who is a primary care patient of Gabriel Haddock, NP. The CCM team was consulted for assistance with chronic disease management and care coordination needs related to Type 1 Diabetes   Review of patient status, including review of consultants reports, relevant laboratory and other test results, and collaboration with appropriate care team members and the patient's provider was performed as part of comprehensive patient evaluation and provision of chronic care management services.    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 See Care Plan for related entries.   Advanced Directives Status: N See Care Plan and [facility]yn[facility] application for related entries.   Medications: Outpatient Encounter Medications as of 03/04/2019  Medication Sig  . Ascorbic Acid (VITAMIN C) 100 MG tablet Take 100 mg by mouth daily.  [name] atorvastatin (LIPITOR) 20 MG tablet Take 20 mg by mouth daily.  [name] GLUCAGON EMERGENCY 1 MG injection INJECT 1 ML INTO MUSCLE AS DIRECTED FOR LOW BLOOD SUGAR (PER HYPOGLYCEMIA PROTOCOL)FOR UP TO 2 DOSES  . insulin aspart (NOVOLOG) 100 UNIT/ML injection 8 units with breakfast, 8 units with lunch and 7 units with dinner, correction scale up to 5 units with meals  . levETIRAcetam (KEPRRA) 500 MG/100ML SOLN Inject 100 mLs (500 mg total) into the vein every 12 (twelve) hours. (Patient not taking: Reported on 03/04/2019)  . Multiple Vitamins-Minerals (MULTIVITAMIN WITH MINERALS) tablet Take 1 tablet by mouth daily.  . Mycophenolate Sodium (MYFORTIC PO) Take 2 tablets by mouth 2 (two) times daily.  . piperacillin-tazobactam (ZOSYN) 3.375 GM/50ML IVPB Inject 50 mLs (3.375 g total) into  the vein every 8 (eight) hours. (Patient not taking: Reported on 03/04/2019)  . PREDNISONE PO Take 1 tablet by mouth daily. Pt unsure of dose  . TACROLIMUS PO Take 3 tablets by mouth daily.   No facility-administered encounter medications on file as of 03/04/2019.      Objective:   Goals Addressed            This Visit's Progress   . RN- I want to stay healthy (pt-stated)       Current Barriers:  . Chronic Disease Management support and education needs related to Diabetes type 1  Nurse Case Manager Clinical Goal(s):  [name] Over the next 90 days, patient will work with [facility] to address needs related to Diabetes  Interventions:  . Evaluation of current treatment plan related to Diabetes  and patient's adherence to plan as established by provider. . Discussed plans with patient for ongoing care management follow up and provided patient with direct contact information for care management team . Advised patient, providing education and rationale, to check cbg QID and record, calling PCP or Endocrinologist for findings outside established parameters.   . Pharmacy referral for patient trying to obtain [place] 2 sensors with alarm system for lows. Endocrinologist office also working on this. Reports continuing to have lows(53)  . Empathetic listening as patient recounted recent hypoglycemic episode where he ended up being admitted to the hospital. Patient stating he still feels "fuzzy headed" and reports his memory is not right. Continues to work with [facility] to recover memory.  . Patient is staying with his dad right now who he states  is his main support person.  . Patient denies needs for transportation, financial assistance or medication assistance at this time.   Patient Self Care Activities:  . Self administers medications as prescribed . Attends church or other social activities . Performs ADL's independently . Performs IADL's independently . Recent hypoglycemic episode resulting in  hospitalization and short term memory loss  Initial goal documentation         [name] was given information about Chronic Care Management services today including:  1. CCM service includes personalized support from designated clinical staff supervised by his physician, including individualized plan of care and coordination with other care providers 2. 24/7 contact phone numbers for assistance for urgent and routine care needs. 3. Service will only be billed when office clinical staff spend 20 minutes or more in a month to coordinate care. 4. Only one practitioner may furnish and bill the service in a calendar month. 5. The patient may stop CCM services at any time (effective at the end of the month) by phone call to the office staff. 6. The patient will be responsible for cost sharing (co-pay) of up to 20% of the service fee (after annual deductible is met).  Patient agreed to services and verbal consent obtained.   Plan:   The care management team will reach out to the patient again over the next 30 days.  The patient has been provided with contact information for the care management team and has been advised to call with any health related questions or concerns.   Merlene Morse Husna Krone RN, BSN Nurse Case Editor, [facility]  [phone]) Business Mobile

## 2019-03-04 NOTE — Assessment & Plan Note (Signed)
Chronic, stable without medication.  Continue to monitor and add on medication as needed based on BP readings.  Home BP below goal, continue to monitor regularly at home.

## 2019-03-04 NOTE — Assessment & Plan Note (Signed)
Followed by [facility] transplant team, continue this collaboration and will involve CCM team for any needs.

## 2019-03-04 NOTE — Assessment & Plan Note (Signed)
Improved with continued memory difficulty. Continue with home nursing and will involve CCM team in patient care.

## 2019-03-04 NOTE — Assessment & Plan Note (Signed)
Followed by [facility] transplant team, continue this collaboration.

## 2019-03-05 NOTE — Patient Instructions (Signed)
Thank you allowing the Chronic Care Management Team to be a part of your care! It was a pleasure speaking with you today!  CCM (Chronic Care Management) Team   Yifan Auker RN, BSN Nurse Care Coordinator  [phone]  Catie [facility] PharmD  Clinical Pharmacist  [phone]  Eula Fried LCSW Clinical Social Worker [phone]  Goals Addressed            This Visit's Progress   . RN- I want to stay healthy (pt-stated)       Current Barriers:  . Chronic Disease Management support and education needs related to Diabetes type 1  Nurse Case Manager Clinical Goal(s):  [name] Over the next 90 days, patient will work with [facility] to address needs related to Diabetes  Interventions:  . Evaluation of current treatment plan related to Diabetes  and patient's adherence to plan as established by provider. . Discussed plans with patient for ongoing care management follow up and provided patient with direct contact information for care management team . Advised patient, providing education and rationale, to check cbg QID and record, calling PCP or Endocrinologist for findings outside established parameters.   . Pharmacy referral for patient trying to obtain [place] 2 sensors with alarm system for lows. Endocrinologist office also working on this. Reports continuing to have lows(53)  . Empathetic listening as patient recounted recent hypoglycemic episode where he ended up being admitted to the hospital. Patient stating he still feels "fuzzy headed" and reports his memory is not right. Continues to work with [facility] to recover memory.  . Patient is staying with his dad right now who he states is his main support person.  . Patient denies needs for transportation, financial assistance or medication assistance at this time.   Patient Self Care Activities:  . Self administers medications as prescribed . Attends church or other social activities . Performs ADL's independently . Performs IADL's  independently . Recent hypoglycemic episode resulting in hospitalization and short term memory loss  Initial goal documentation        The patient verbalized understanding of instructions provided today and declined a print copy of patient instruction materials.   The patient has been provided with contact information for the care management team and has been advised to call with any health related questions or concerns.   Memory Compensation Strategies  1. Use "WARM" strategy.  W= write it down  A= associate it  R= repeat it  M= make a mental note  2.   You can keep a Social worker.  Use a 3-ring notebook with sections for the following: calendar, important names and phone numbers,  medications, doctors' names/phone numbers, lists/reminders, and a section to journal what you did  each day.   3.    Use a calendar to write appointments down.  4.    Write yourself a schedule for the day.  This can be placed on the calendar or in a separate section of the Memory Notebook.  Keeping a  regular schedule can help memory.  5.    Use medication organizer with sections for each day or morning/evening pills.  You may need help loading it  6.    Keep a basket, or pegboard by the door.  Place items that you need to take out with you in the basket or on the pegboard.  You may also want to  include a message board for reminders.  7.    Use sticky notes.  Place sticky  notes with reminders in a place where the task is performed.  For example: " turn off the  stove" placed by the stove, "lock the door" placed on the door at eye level, " take your medications" on  the bathroom mirror or by the place where you normally take your medications.  8.    Use alarms/timers.  Use while cooking to remind yourself to check on food or as a reminder to take your medicine, or as a  reminder to make a call, or as a reminder to perform another task, etc.

## 2019-03-23 ENCOUNTER — Telehealth: Payer: Self-pay

## 2019-04-05 ENCOUNTER — Telehealth: Payer: Self-pay

## 2019-04-06 ENCOUNTER — Ambulatory Visit: Payer: Medicare Other | Admitting: Pharmacist

## 2019-04-06 DIAGNOSIS — E10649 Type 1 diabetes mellitus with hypoglycemia without coma: Secondary | ICD-10-CM

## 2019-04-06 DIAGNOSIS — E785 Hyperlipidemia, unspecified: Secondary | ICD-10-CM

## 2019-04-06 DIAGNOSIS — Z940 Kidney transplant status: Secondary | ICD-10-CM

## 2019-04-06 DIAGNOSIS — E1069 Type 1 diabetes mellitus with other specified complication: Secondary | ICD-10-CM

## 2019-04-06 NOTE — Patient Instructions (Addendum)
Visit Information  Goals Addressed            This Visit's Progress     Patient Stated   . PharmD "I have a lot of medications" (pt-stated)       Current Barriers:  . Polypharmacy; complex patient with multiple comorbidities including T1DM (diagnosed at age 43 in 51), s/p pancreas/kidney transplant in 03/2016 (pancreas failed, patient on list for repeat); s/p hospital admission first to [facility] then transfer to [place] in 01/2019 for severe hypoglycemic episode w/ loss of consciousness.  . Notes he was living with his father immediately post discharge, but has since moved back to his own house.  o T1DM: follows w/ Dr. Honor Junes at [facility] Endocrinology. Last A1c 7.5% in 01/2019; has f/u scheduled today. Noted that a pump malfunction may have contributed to hypoglycemic episode; since that time, patient has received a new Medtronic pump and has started using FreeStyle Libre 2, which alerts patient to hypoglycemia. Patient notes that he is happy with this. Novolog per pump; prescription for Glucagon, though notes this is the old syringe that requires reconstition o S/p kidney/pancreas transplant: follows at [facility] transplant; last appt 12/2018, f/u scheduled 06/2019. Tacrolimus 1.5 mg BID, mycophenolate 360 mg BID, prednisone 5 mg daily.  o ASCVD risk reduction: ASA 81 mg, atorvastatin 20 mg daily. Last LDL 88 in 05/2018, mentioned in [place] note that this is at goal. o Health maintenance: notes he takes daily multivitamin, Vitamin C, and OTC potassium daily  Pharmacist Clinical Goal(s):  [name] Over the next 90 days, patient will work with PharmD and provider towards optimized medication management  Interventions: . Comprehensive medication review performed; medication list updated in electronic medical record . Discussed upcoming appointment w/ Dr. Honor Junes today. Recommended he discuss prescription for newer version of glucagon (Gvoke prefilled pen, Baqsimi nasal glucagon) for easier rescue than old glucagon  that requires reconstituting. Patient verbalized understanding . Reviewed upcoming appointment w/ PCP next month and f/u with transplant in February.  . Patient denies any financial concerns with his medication at this time. Denies any other questions or concerns.   Patient Self Care Activities:  . Patient will take medications as prescribed  Initial goal documentation        The patient verbalized understanding of instructions provided today and declined a print copy of patient instruction materials.    Plan: - Will collaborate w/ RN CM for outreach in January. I will outreach patient in February after f/u with transplant team for medication management review and support  [name], PharmD, [place] [phone]

## 2019-04-06 NOTE — Chronic Care Management (AMB) (Signed)
Chronic Care Management   Note  04/06/2019 Name: Gabriel Pruitte Engen Sr. MRN: [id number] DOB: 1976/03/13   Subjective:  Gabriel Beams Yepez Sr. is a 43 y.o. year old male who is a primary care patient of Kathrine Haddock, NP. The CCM team was consulted for assistance with chronic disease management and care coordination needs.    Review of patient status, including review of consultants reports, laboratory and other test data, was performed as part of comprehensive evaluation and provision of chronic care management services.   Objective:  Lab Results  Component Value Date   CREATININE 1.04 01/17/2019   CREATININE 1.16 01/16/2019   CREATININE 1.21 01/16/2019    Lab Results  Component Value Date   HGBA1C 7.5 (H) 01/17/2019    Clinical ASCVD: No  The 10-year ASCVD risk score Gabriel Bussing DC Jr., et al., 2013) is: 2.6%   Values used to calculate the score:     Age: 89 years     Sex: Male     Is Non-Hispanic African American: No     Diabetic: Yes     Tobacco smoker: No     Systolic Blood Pressure: [id number] mmHg     Is BP treated: No     HDL Cholesterol: 49 mg/dL     Total Cholesterol: 158 mg/dL    BP Readings from Last 3 Encounters:  03/04/19 135/89  01/17/19 133/67  03/23/17 103/63    Allergies  Allergen Reactions   Losartan Swelling and Other (See Comments)    Reaction:  Feet/ankle swelling    Oxycodone Nausea Only    Medications Reviewed Today    Reviewed by De Hollingshead, [facility] (Pharmacist) on 04/06/19 at 1029  Med List Status: <None>  Medication Order Taking? Sig Documenting Provider Last Dose Status Informant  Ascorbic Acid (VITAMIN C) 100 MG tablet [id number] Yes Take 500 mg by mouth daily.  [provider] Taking Active   aspirin 81 MG chewable tablet [id number] Yes Chew 81 mg by mouth daily. [provider] Taking Active   atorvastatin (LIPITOR) 20 MG tablet [id number] Yes Take 20 mg by mouth daily. [provider] Taking Active   GLUCAGON  EMERGENCY 1 MG injection [id number] No INJECT 1 ML INTO MUSCLE AS DIRECTED FOR LOW BLOOD SUGAR (PER HYPOGLYCEMIA PROTOCOL)FOR UP TO 2 DOSES [provider] Not Taking Active   insulin aspart (NOVOLOG) 100 UNIT/ML injection [id number] Yes 8 units with breakfast, 8 units with lunch and 7 units with dinner, correction scale up to 5 units with meals [provider] Taking Active            Med Note Darnelle Huff, [place]   Wed Apr 06, 2019 10:04 AM) Per insulin pump  Multiple Vitamins-Minerals (MULTIVITAMIN WITH MINERALS) tablet [id number] Yes Take 1 tablet by mouth daily. [provider] Taking Active   mycophenolate (MYFORTIC) 180 MG EC tablet [id number] Yes Take 360 mg by mouth 2 (two) times daily.  [provider] Taking Active   Potassium 99 MG TABS [id number] Yes Take 99 mg by mouth. [provider] Taking Active   predniSONE (DELTASONE) 5 MG tablet [id number] Yes Take 5 mg by mouth daily. [provider] Taking Active   tacrolimus (PROGRAF) 0.5 MG capsule [id number] Yes Take 1.5 mg by mouth every 12 (twelve) hours.  [provider] Taking Active            Assessment:   Goals Addressed            This  Visit's Progress     Patient Stated    PharmD "I have a lot of medications" (pt-stated)       Current Barriers:   Polypharmacy; complex patient with multiple comorbidities including T1DM (diagnosed at age 17 in 71), s/p pancreas/kidney transplant in 03/2016 (pancreas failed, patient on list for repeat); s/p hospital admission first to [facility] then transfer to [place] in 01/2019 for severe hypoglycemic episode w/ loss of consciousness.   Notes he was living with his father immediately post discharge, but has since moved back to his own house.  o T1DM: follows w/ Dr. Honor Junes at [facility] Endocr[facility]. Last A1c 7.5% in 01/2019; has f/u scheduled today. Noted that a pump malfunction may have contributed to hypoglycemic episode; since that time,  patient has received a new Medtronic pump and has started using FreeStyle Libre 2, which alerts patient to hypoglycemia. Patient notes that he is happy with this. Novolog per pump; prescription for Glucagon, though notes this is the old syringe that requires reconstition o S/p kidney/pancreas transplant: follows at [facility] transplant; last appt 12/2018, f/u scheduled 06/2019. Tacrolimus 1.5 mg BID, mycophenolate 360 mg BID, prednisone 5 mg daily.  o ASCVD risk reduction: ASA 81 mg, atorvastatin 20 mg daily. Last LDL 88 in 05/2018, mentioned in [place] note that this is at goal. o Health maintenance: notes he takes daily multivitamin, Vitamin C, and OTC potassium daily  Pharmacist Clinical Goal(s):   Over the next 90 days, patient will work with PharmD and provider towards optimized medication management  Interventions:  Comprehensive medication review performed; medication list updated in electronic medical record  Discussed upcoming appointment w/ Dr. Honor Junes today. Recommended he discuss prescription for newer version of glucagon (Gvoke prefilled pen, Baqsimi nasal glucagon) for easier rescue than old glucagon that requires reconstituting. Patient verbalized understanding  Reviewed upcoming appointment w/ PCP next month and f/u with transplant in February.   Patient denies any financial concerns with his medication at this time. Denies any other questions or concerns.   Patient Self Care Activities:   Patient will take medications as prescribed  Initial goal documentation        Plan: - Will collaborate w/ RN CM for outreach in January. I will outreach patient in February after f/u with transplant team for medication management review and support  Gabriel Huff, PharmD, [place] [phone]

## 2019-04-15 ENCOUNTER — Other Ambulatory Visit: Payer: Self-pay

## 2019-04-15 ENCOUNTER — Encounter: Payer: Self-pay | Admitting: Nurse Practitioner

## 2019-04-15 ENCOUNTER — Ambulatory Visit (INDEPENDENT_AMBULATORY_CARE_PROVIDER_SITE_OTHER): Payer: Medicare Other | Admitting: Nurse Practitioner

## 2019-04-15 VITALS — BP 127/85 | HR 84 | Temp 97.6°F

## 2019-04-15 DIAGNOSIS — E10649 Type 1 diabetes mellitus with hypoglycemia without coma: Secondary | ICD-10-CM

## 2019-04-15 DIAGNOSIS — I10 Essential (primary) hypertension: Secondary | ICD-10-CM | POA: Diagnosis not present

## 2019-04-15 DIAGNOSIS — E785 Hyperlipidemia, unspecified: Secondary | ICD-10-CM

## 2019-04-15 DIAGNOSIS — E1159 Type 2 diabetes mellitus with other circulatory complications: Secondary | ICD-10-CM | POA: Diagnosis not present

## 2019-04-15 DIAGNOSIS — E1069 Type 1 diabetes mellitus with other specified complication: Secondary | ICD-10-CM

## 2019-04-15 NOTE — Assessment & Plan Note (Signed)
Chronic, ongoing with insulin pump in place.  Continue collaboration with Dr. Honor Junes with endocrinology and CCM team to assist in ensuring care between providers coordinated and assistance with medication regimen.  Return in 3 months.

## 2019-04-15 NOTE — Patient Instructions (Signed)

## 2019-04-15 NOTE — Progress Notes (Signed)
BP 127/85   Pulse 84   Temp 97.6 F (36.4 C) (Oral)   SpO2 99%    Subjective:    Patient ID: Gabriel Mannan Sr., male    DOB: March 28, 1976, 43 y.o.   MRN: [id number]  HPI: Gabriel [place]. is a 43 y.o. male  Chief Complaint  Patient presents with  . Diabetes  . Hyperlipidemia  . Hypertension   DIABETES Currently has insulin pump in place and is followed by Dr. Honor Junes and last seen 04/06/19.  Insulin pump Medtronic 630G, Novolog can go to 50 units/day.  Was hospitalized in September due to low BS, 40 when EMS arrived.  Was told pump did not work correctly, currently has new pump which on review of recent endo note is working well.  Has Libre in place.  August A1C was 8.4%.  Has history of kidney transplant 03/18/2016, which was successful.  At same time had pancreas transplant which did not take.  Is followed by [facility] transplant team and they are working on setting up for another pancreas transplant.  Returns to see them in two months.  Last thyroid check 01/18/19 == 0.88 Hypoglycemic episodes:no Polydipsia/polyuria: no Visual disturbance: no Chest pain: no Paresthesias: no Glucose Monitoring: yes             Accucheck frequency: TID             Fasting glucose:             Post prandial:             Evening:             Before meals: Taking Insulin?: yes             Long acting insulin:             Short acting insulin: Blood Pressure Monitoring: daily Retinal Examination: Up to Date Foot Exam: Up to Date Pneumovax: Up to Date Influenza: Up to Date Aspirin: no   HYPERTENSION / HYPERLIPIDEMIA Not currently taking BP medication, but is on Lipitor for cholesterol. Satisfied with current treatment? yes Duration of hypertension: chronic BP monitoring frequency: a few times a day BP range: 120/70's BP medication side effects: no Duration of hyperlipidemia: chronic Cholesterol medication side effects: no Cholesterol supplements: none Medication compliance: good  compliance Aspirin: no Recent stressors: no Recurrent headaches: no Visual changes: no Palpitations: no Dyspnea: no Chest pain: no Lower extremity edema: no Dizzy/lightheaded: no   Relevant past medical, surgical, family and social history reviewed and updated as indicated. Interim medical history since our last visit reviewed. Allergies and medications reviewed and updated.  Review of Systems  Constitutional: Negative for activity change, diaphoresis, fatigue and fever.  Respiratory: Negative for cough, chest tightness, shortness of breath and wheezing.   Cardiovascular: Negative for chest pain, palpitations and leg swelling.  Gastrointestinal: Negative for abdominal distention, abdominal pain, constipation, diarrhea, nausea and vomiting.  Endocrine: Negative for cold intolerance, heat intolerance, polydipsia, polyphagia and polyuria.  Neurological: Negative for dizziness, syncope, weakness, light-headedness, numbness and headaches.  Psychiatric/Behavioral: Negative.     Per HPI unless specifically indicated above     Objective:    BP 127/85   Pulse 84   Temp 97.6 F (36.4 C) (Oral)   SpO2 99%   Wt Readings from Last 3 Encounters:  03/04/19 145 lb 12.8 oz (66.1 kg)  01/16/19 178 lb 2.1 oz (80.8 kg)  03/23/17 138 lb 4.8 oz (62.7 kg)  Physical Exam Vitals signs and nursing note reviewed.  Constitutional:      General: He is awake. He is not in acute distress.    Appearance: He is well-developed. He is not ill-appearing.  HENT:     Head: Normocephalic and atraumatic.     Right Ear: Hearing normal. No drainage.     Left Ear: Hearing normal. No drainage.  Eyes:     General: Lids are normal.        Right eye: No discharge.        Left eye: No discharge.     Conjunctiva/sclera: Conjunctivae normal.     Pupils: Pupils are equal, round, and reactive to light.  Neck:     Musculoskeletal: Normal range of motion and neck supple.     Thyroid: No thyromegaly.      Vascular: No carotid bruit.  Cardiovascular:     Rate and Rhythm: Normal rate and regular rhythm.     Heart sounds: Normal heart sounds, S1 normal and S2 normal. No murmur. No gallop.   Pulmonary:     Effort: Pulmonary effort is normal. No accessory muscle usage or respiratory distress.     Breath sounds: Normal breath sounds.  Abdominal:     General: Bowel sounds are normal.     Palpations: Abdomen is soft.  Musculoskeletal: Normal range of motion.     Right lower leg: No edema.     Left lower leg: No edema.  Skin:    General: Skin is warm and dry.  Neurological:     Mental Status: He is alert and oriented to person, place, and time.     Deep Tendon Reflexes: Reflexes are normal and symmetric.  Psychiatric:        Attention and Perception: Attention normal.        Mood and Affect: Mood normal.        Speech: Speech normal.        Behavior: Behavior normal. Behavior is cooperative.        Thought Content: Thought content normal.        Judgment: Judgment normal.    Diabetic Foot Exam - Simple   Simple Foot Form Visual Inspection No deformities, no ulcerations, no other skin breakdown bilaterally: Yes Sensation Testing Intact to touch and monofilament testing bilaterally: Yes Pulse Check Posterior Tibialis and Dorsalis pulse intact bilaterally: Yes Comments     Results for orders placed or performed in visit on 02/28/19  HM DIABETES EYE EXAM  Result Value Ref Range   HM Diabetic Eye Exam Retinopathy (A) No Retinopathy      Assessment & Plan:   Problem List Items Addressed This Visit      Cardiovascular and Mediastinum   Hypertension associated with diabetes (HCC)    Chronic, stable without medication.  Continue to monitor and add on medication as needed based on BP readings.  Home BP below goal, continue to monitor regularly at home.        Endocrine   Type I diabetes mellitus, uncontrolled ([place]) - Primary    Chronic, ongoing with insulin pump in place.   Continue collaboration with Dr. Honor Junes with endocrinology and CCM team to assist in ensuring care between providers coordinated and assistance with medication regimen.  Return in 3 months.      Hyperlipidemia due to type 1 diabetes mellitus (HCC)    Chronic, ongoing.  Continue statin daily and adjust dose as needed.  Lipid and CMP today.  Relevant Orders   Comprehensive metabolic panel   Lipid Panel w/o Chol/HDL Ratio out       Follow up plan: Return in about 3 months (around 07/14/2019) for T1DM, HTN/HLD, Kidney follow-up.

## 2019-04-15 NOTE — Assessment & Plan Note (Signed)
Chronic, stable without medication.  Continue to monitor and add on medication as needed based on BP readings.  Home BP below goal, continue to monitor regularly at home.

## 2019-04-15 NOTE — Assessment & Plan Note (Signed)
Chronic, ongoing.  Continue statin daily and adjust dose as needed.  Lipid and CMP today.

## 2019-04-16 LAB — COMPREHENSIVE METABOLIC PANEL
ALT: 44 IU/L (ref 0–44)
AST: 23 IU/L (ref 0–40)
Albumin/Globulin Ratio: 2 (ref 1.2–2.2)
Albumin: 4.5 g/dL (ref 4.0–5.0)
Alkaline Phosphatase: 93 IU/L (ref 39–117)
BUN/Creatinine Ratio: 15 (ref 9–20)
BUN: 17 mg/dL (ref 6–24)
Bilirubin Total: 1.3 mg/dL — ABNORMAL HIGH (ref 0.0–1.2)
CO2: 25 mmol/L (ref 20–29)
Calcium: 10.1 mg/dL (ref 8.7–10.2)
Chloride: 106 mmol/L (ref 96–106)
Creatinine, Ser: 1.13 mg/dL (ref 0.76–1.27)
GFR calc Af Amer: 91 mL/min/{1.73_m2} (ref >59)
GFR calc non Af Amer: 79 mL/min/{1.73_m2} (ref >59)
Globulin, Total: 2.2 g/dL (ref 1.5–4.5)
Glucose: 91 mg/dL (ref 65–99)
Potassium: 4.3 mmol/L (ref 3.5–5.2)
Sodium: 143 mmol/L (ref 134–144)
Total Protein: 6.7 g/dL (ref 6.0–8.5)

## 2019-04-16 LAB — LIPID PANEL W/O CHOL/HDL RATIO
Cholesterol, Total: 150 mg/dL (ref 100–199)
HDL: 61 mg/dL (ref >39)
LDL Chol Calc (NIH): 74 mg/dL (ref 0–99)
Triglycerides: 77 mg/dL (ref 0–149)
VLDL Cholesterol Cal: 15 mg/dL (ref 5–40)

## 2019-04-21 ENCOUNTER — Other Ambulatory Visit: Payer: Self-pay

## 2019-04-21 DIAGNOSIS — Z20822 Contact with and (suspected) exposure to covid-19: Secondary | ICD-10-CM

## 2019-04-22 LAB — NOVEL CORONAVIRUS, NAA: SARS-CoV-2, NAA: NOT DETECTED

## 2019-05-11 ENCOUNTER — Telehealth: Payer: Self-pay

## 2019-05-24 ENCOUNTER — Ambulatory Visit (INDEPENDENT_AMBULATORY_CARE_PROVIDER_SITE_OTHER): Payer: Medicare Other | Admitting: Pharmacist

## 2019-05-24 DIAGNOSIS — E785 Hyperlipidemia, unspecified: Secondary | ICD-10-CM | POA: Diagnosis not present

## 2019-05-24 DIAGNOSIS — E10649 Type 1 diabetes mellitus with hypoglycemia without coma: Secondary | ICD-10-CM

## 2019-05-24 DIAGNOSIS — E1069 Type 1 diabetes mellitus with other specified complication: Secondary | ICD-10-CM | POA: Diagnosis not present

## 2019-05-24 NOTE — Patient Instructions (Signed)
Visit Information  Goals Addressed            This Visit's Progress     Patient Stated   . PharmD "I have a lot of medications" (pt-stated)       Current Barriers:  . Polypharmacy; complex patient with multiple comorbidities including T1DM (diagnosed at age 44 in 80), s/p pancreas/kidney transplant in 03/2016 (pancreas failed, patient on list for repeat);   o T1DM: follows w/ Dr. Honor Junes at [facility] Endocrinology. Last A1c 7.5% in 01/2019; Does note multiple episodes of hypoglycemia, especially overnight. Discusses that he sometimes overcorrects w/ snack cakes, but tries to choose options w/ protein.  o S/p kidney/pancreas transplant: follows at [facility] transplant; last appt 12/2018, f/u scheduled 06/2019. Tacrolimus 1.5 mg BID, mycophenolate 360 mg BID, prednisone 5 mg daily. He notes he is looking forward to these appointments and work up to determine if he is a candidate for transplant o ASCVD risk reduction: ASA 81 mg, atorvastatin 20 mg daily. Last LDL 88 in 05/2018, mentioned in [place] note that this is at goal. o Health maintenance: notes he takes daily multivitamin, Vitamin C, and OTC potassium daily  Pharmacist Clinical Goal(s):  [name] Over the next 90 days, patient will work with PharmD and provider towards optimized medication management  Interventions: . Comprehensive medication review performed; medication list updated in electronic medical record . Discussed rule of 15. Recommending choosing quick carbohydrates when sugar is very low, and once corrected, eat a snack with protein to maintain sugar. Discussed eating a small protein snack before bed to maintain sugar.  . Denies any other medication questions or concerns at this time.   Patient Self Care Activities:  . Patient will take medications as prescribed  Please see past updates related to this goal by clicking on the "Past Updates" button in the selected goal         The patient verbalized understanding of instructions  provided today and declined a print copy of patient instruction materials.   Plan: - Scheduled follow up call 07/19/19 @ 11 am  [name], PharmD, [place] [phone]

## 2019-05-24 NOTE — Chronic Care Management (AMB) (Signed)
Chronic Care Management   Follow Up Note   05/24/2019 Name: Javares Carlise Fuston Sr. MRN: [id number] DOB: September 07, 1975  Referred by: [name], NP Reason for referral : Chronic Care Management (Medication Management)   Cathie Beams Asfour Sr. is a 44 y.o. year old male who is a primary care patient of Cannady, [name], NP. The CCM team was consulted for assistance with chronic disease management and care coordination needs.    Contacted patient for medication management review.   Review of patient status, including review of consultants reports, relevant laboratory and other test results, and collaboration with appropriate care team members and the patient's provider was performed as part of comprehensive patient evaluation and provision of chronic care management services.    SDOH (Social Determinants of Health) screening performed today: None. See Care Plan for related entries.   Outpatient Encounter Medications as of 05/24/2019  Medication Sig Note  . Ascorbic Acid (VITAMIN C) 100 MG tablet Take 500 mg by mouth daily.    [name] aspirin 81 MG chewable tablet Chew 81 mg by mouth daily.   [name] atorvastatin (LIPITOR) 20 MG tablet Take 20 mg by mouth daily.   [name] GLUCAGON EMERGENCY 1 MG injection INJECT 1 ML INTO MUSCLE AS DIRECTED FOR LOW BLOOD SUGAR (PER HYPOGLYCEMIA PROTOCOL)FOR UP TO 2 DOSES   . insulin aspart (NOVOLOG) 100 UNIT/ML injection 8 units with breakfast, 8 units with lunch and 7 units with dinner, correction scale up to 5 units with meals 04/06/2019: Per insulin pump  . Multiple Vitamins-Minerals (MULTIVITAMIN WITH MINERALS) tablet Take 1 tablet by mouth daily.   . mycophenolate (MYFORTIC) 180 MG EC tablet Take 360 mg by mouth 2 (two) times daily.    . Potassium 99 MG TABS Take 99 mg by mouth.   . predniSONE (DELTASONE) 5 MG tablet Take 5 mg by mouth daily.   . tacrolimus (PROGRAF) 0.5 MG capsule Take 1.5 mg by mouth every 12 (twelve) hours.     No facility-administered encounter  medications on file as of 05/24/2019.     Goals Addressed            This Visit's Progress     Patient Stated   . PharmD "I have a lot of medications" (pt-stated)       Current Barriers:  . Polypharmacy; complex patient with multiple comorbidities including T1DM (diagnosed at age 22 in 32), s/p pancreas/kidney transplant in 03/2016 (pancreas failed, patient on list for repeat);   o T1DM: follows w/ Dr. Honor Junes at [facility]. Last A1c 7.5% in 01/2019; Does note multiple episodes of hypoglycemia, especially overnight. Discusses that he sometimes overcorrects w/ snack cakes, but tries to choose options w/ protein.  o S/p kidney/pancreas transplant: follows at [facility] transplant; last appt 12/2018, f/u scheduled 06/2019. Tacrolimus 1.5 mg BID, mycophenolate 360 mg BID, prednisone 5 mg daily. He notes he is looking forward to these appointments and work up to determine if he is a candidate for transplant o ASCVD risk reduction: ASA 81 mg, atorvastatin 20 mg daily. Last LDL 88 in 05/2018, mentioned in [place] note that this is at goal. o Health maintenance: notes he takes daily multivitamin, Vitamin C, and OTC potassium daily  Pharmacist Clinical Goal(s):  [name] Over the next 90 days, patient will work with PharmD and provider towards optimized medication management  Interventions: . Comprehensive medication review performed; medication list updated in electronic medical record . Discussed rule of 15. Recommending choosing quick carbohydrates when sugar is very low,  and once corrected, eat a snack with protein to maintain sugar. Discussed eating a small protein snack before bed to maintain sugar.  . Denies any other medication questions or concerns at this time.   Patient Self Care Activities:  . Patient will take medications as prescribed  Please see past updates related to this goal by clicking on the "Past Updates" button in the selected goal          Plan: - Scheduled follow up  call 07/19/19 @ 11 am  [name], PharmD, [place] [phone]

## 2019-06-01 ENCOUNTER — Ambulatory Visit (INDEPENDENT_AMBULATORY_CARE_PROVIDER_SITE_OTHER): Payer: Medicare Other

## 2019-06-01 ENCOUNTER — Other Ambulatory Visit: Payer: Self-pay

## 2019-06-01 VITALS — BP 102/64 | HR 81 | Temp 98.1°F | Ht 66.4 in | Wt 151.8 lb

## 2019-06-01 DIAGNOSIS — Z0000 Encounter for general adult medical examination without abnormal findings: Secondary | ICD-10-CM | POA: Diagnosis not present

## 2019-06-01 NOTE — Progress Notes (Signed)
Subjective:   Sharbel Cioffi Krone Sr. is a 44 y.o. male who presents for Medicare Annual/initial preventive examination.   Review of Systems:   Cardiac Risk Factors include: advanced age (>54men, >105 women);male gender;diabetes mellitus     Objective:    Vitals: BP 102/64 (BP Location: Left Arm)   Pulse 81   Temp 98.1 F (36.7 C) (Temporal)   Ht 5' 6.4" (1.687 m)   Wt 151 lb 12.8 oz (68.9 kg)   BMI 24.21 kg/m   Body mass index is 24.21 kg/m.  Advanced Directives 06/01/2019 01/16/2019 01/16/2019 03/22/2017 03/22/2017 09/04/2015 07/27/2015  Does Patient Have a Medical Advance Directive? No No No No No No Yes;No  Would patient like information on creating a medical advance directive? - No - Patient declined No - Patient declined No - Patient declined No - Patient declined - -    Tobacco Social History   Tobacco Use  Smoking Status Never Smoker  Smokeless Tobacco Never Used     Counseling given: Not Answered   Clinical Intake:  Pre-visit preparation completed: Yes  Pain : No/denies pain     Nutritional Status: BMI of 19-24  Normal Nutritional Risks: None Diabetes: Yes CBG done?: No Did pt. bring in CBG monitor from home?: No  How often do you need to have someone help you when you read instructions, pamphlets, or other written materials from your doctor or pharmacy?: 1 - Never  Nutrition Risk Assessment:  Has the patient had any N/V/D within the last 2 months?  No  Does the patient have any non-healing wounds?  No  Has the patient had any unintentional weight loss or weight gain?  No   Diabetes:  Is the patient diabetic?  Yes  If diabetic, was a CBG obtained today?  No  Did the patient bring in their glucometer from home?  No  How often do you monitor your CBG's? Has arm sensor that checks, checks freqeuntly during day.   Financial Strains and Diabetes Management:  Are you having any financial strains with the device, your supplies or your medication? No .    Does the patient want to be seen by Chronic Care Management for management of their diabetes?  yes Would the patient like to be referred to a Nutritionist or for Diabetic Management?  No   Already in contact with CCM team pharmacist   Diabetic Exams:  Diabetic Eye Exam: Completed 02/2019  Diabetic Foot Exam: Pt has been advised about the importance in completing this exam.      Information entered by :: Emiyah Spraggins,LPN  Past Medical History:  Diagnosis Date  . Cataract    bilateral  . End stage renal disease ([place])    peritoneal dialysis since 01/2015  . Hypertension   . Type 1 diabetes [facility])    Past Surgical History:  Procedure Laterality Date  . CAPD INSERTION N/A 12/06/2014   Procedure: LAPAROSCOPIC INSERTION CONTINUOUS AMBULATORY PERITONEAL DIALYSIS  (CAPD) CATHETER;  Surgeon: Katha Cabal, MD;  Location: [facility] ORS;  Service: Vascular;  Laterality: N/A;  . CATARACT EXTRACTION W/ INTRAOCULAR LENS IMPLANT Bilateral 2015  . CYST REMOVAL TRUNK    . GYNECOMASTIA EXCISION  1993  . KIDNEY TRANSPLANT     Family History  Problem Relation Age of Onset  . Diabetes Mother   . Diabetes Paternal Grandfather    Social History   Socioeconomic History  . Marital status: Single    Spouse name: Not on file  . Number  of children: Not on file  . Years of education: Not on file  . Highest education level: Not on file  Occupational History  . Occupation: disability   Tobacco Use  . Smoking status: Never Smoker  . Smokeless tobacco: Never Used  Substance and Sexual Activity  . Alcohol use: No    Alcohol/week: 0.0 standard drinks  . Drug use: No  . Sexual activity: Never  Other Topics Concern  . Not on file  Social History Narrative  . Not on file   Social Determinants of Health   Financial Resource Strain: Low Risk   . Difficulty of Paying Living Expenses: Not very hard  Food Insecurity: No Food Insecurity  . Worried About Charity fundraiser in the Last Year: Never  true  . Ran Out of Food in the Last Year: Never true  Transportation Needs: No Transportation Needs  . Lack of Transportation (Medical): No  . Lack of Transportation (Non-Medical): No  Physical Activity: Inactive  . Days of Exercise per Week: 0 days  . Minutes of Exercise per Session: 0 min  Stress: No Stress Concern Present  . Feeling of Stress : Only a little  Social Connections: Unknown  . Frequency of Communication with Friends and Family: Once a week  . Frequency of Social Gatherings with Friends and Family: Once a week  . Attends Religious Services: Never  . Active Member of Clubs or Organizations: No  . Attends Archivist Meetings: Never  . Marital Status: Not on file    Outpatient Encounter Medications as of 06/01/2019  Medication Sig  . Ascorbic Acid (VITAMIN C) 100 MG tablet Take 500 mg by mouth daily.   [name] aspirin 81 MG chewable tablet Chew 81 mg by mouth daily.  [name] atorvastatin (LIPITOR) 20 MG tablet Take 20 mg by mouth daily.  [name] GLUCAGON EMERGENCY 1 MG injection INJECT 1 ML INTO MUSCLE AS DIRECTED FOR LOW BLOOD SUGAR (PER HYPOGLYCEMIA PROTOCOL)FOR UP TO 2 DOSES  . insulin aspart (NOVOLOG) 100 UNIT/ML injection 8 units with breakfast, 8 units with lunch and 7 units with dinner, correction scale up to 5 units with meals  . Multiple Vitamins-Minerals (MULTIVITAMIN WITH MINERALS) tablet Take 1 tablet by mouth daily.  . mycophenolate (MYFORTIC) 180 MG EC tablet Take 360 mg by mouth 2 (two) times daily.   . Potassium 99 MG TABS Take 99 mg by mouth.  . predniSONE (DELTASONE) 5 MG tablet Take 5 mg by mouth daily.  . tacrolimus (PROGRAF) 0.5 MG capsule Take 1.5 mg by mouth every 12 (twelve) hours.    No facility-administered encounter medications on file as of 06/01/2019.    Activities of Daily Living In your present state of health, do you have any difficulty performing the following activities: 06/01/2019 03/04/2019  Hearing? N N  Comment no hearing aids -  Vision? N  N  Comment no glasses, [facility] -  Difficulty concentrating or making decisions? N Y  Walking or climbing stairs? N N  Dressing or bathing? N N  Doing errands, shopping? N N  Preparing Food and eating ? N -  Using the Toilet? N -  In the past six months, have you accidently leaked urine? N -  Do you have problems with loss of bowel control? N -  Managing your Medications? N -  Managing your Finances? N -  Housekeeping or managing your Housekeeping? N -  Some recent data might be hidden    Patient Care Team:  [name], NP as PCP - General (Nurse Practitioner) Abisogun, Domenica Reamer, MD as Consulting Physician (Endocrinology) Minor, Dalbert Garnet, RN as [place], [place], [facility] as Pharmacist (Pharmacist)   Assessment:   This is a routine wellness examination for Rishabh.  Exercise Activities and Dietary recommendations Current Exercise Habits: Home exercise routine, Type of exercise: walking;strength training/weights(push ups occasionally, arm stengthening), Time (Minutes): 30, Frequency (Times/Week): 7, Weekly Exercise (Minutes/Week): 210, Intensity: Mild, Exercise limited by: Other - see comments(recent kidney transplant)  Goals    . PharmD "I have a lot of medications" (pt-stated)     Current Barriers:  . Polypharmacy; complex patient with multiple comorbidities including T1DM (diagnosed at age 3 in 48), s/p pancreas/kidney transplant in 03/2016 (pancreas failed, patient on list for repeat);   o T1DM: follows w/ Dr. Honor Junes at [facility]. Last A1c 7.5% in 01/2019; Does note multiple episodes of hypoglycemia, especially overnight. Discusses that he sometimes overcorrects w/ snack cakes, but tries to choose options w/ protein.  o S/p kidney/pancreas transplant: follows at [facility] transplant; last appt 12/2018, f/u scheduled 06/2019. Tacrolimus 1.5 mg BID, mycophenolate 360 mg BID, prednisone 5 mg daily. He notes he is looking  forward to these appointments and work up to determine if he is a candidate for transplant o ASCVD risk reduction: ASA 81 mg, atorvastatin 20 mg daily. Last LDL 88 in 05/2018, mentioned in [place] note that this is at goal. o Health maintenance: notes he takes daily multivitamin, Vitamin C, and OTC potassium daily  Pharmacist Clinical Goal(s):  [name] Over the next 90 days, patient will work with PharmD and provider towards optimized medication management  Interventions: . Comprehensive medication review performed; medication list updated in electronic medical record . Discussed rule of 15. Recommending choosing quick carbohydrates when sugar is very low, and once corrected, eat a snack with protein to maintain sugar. Discussed eating a small protein snack before bed to maintain sugar.  . Denies any other medication questions or concerns at this time.   Patient Self Care Activities:  . Patient will take medications as prescribed  Please see past updates related to this goal by clicking on the "Past Updates" button in the selected goal      . RN- I want to stay healthy (pt-stated)     Current Barriers:  . Chronic Disease Management support and education needs related to Diabetes type 1  Nurse Case Manager Clinical Goal(s):  [name] Over the next 90 days, patient will work with [facility] to address needs related to Diabetes  Interventions:  . Evaluation of current treatment plan related to Diabetes  and patient's adherence to plan as established by provider. . Discussed plans with patient for ongoing care management follow up and provided patient with direct contact information for care management team . Advised patient, providing education and rationale, to check cbg QID and record, calling PCP or Endocrinologist for findings outside established parameters.   . Pharmacy referral for patient trying to obtain [place] 2 sensors with alarm system for lows. Endocrinologist office also working on this. Reports  continuing to have lows(53)  . Empathetic listening as patient recounted recent hypoglycemic episode where he ended up being admitted to the hospital. Patient stating he still feels "fuzzy headed" and reports his memory is not right. Continues to work with [facility] to recover memory.  . Patient is staying with his dad right now who he states is his main support person.  . Patient denies  needs for transportation, financial assistance or medication assistance at this time.   Patient Self Care Activities:  . Self administers medications as prescribed . Attends church or other social activities . Performs ADL's independently . Performs IADL's independently . Recent hypoglycemic episode resulting in hospitalization and short term memory loss  Initial goal documentation        Fall Risk Fall Risk  06/01/2019  Falls in the past year? 0  Number falls in past yr: 0  Injury with Fall? 0   FALL RISK PREVENTION PERTAINING TO THE HOME:  Any stairs in or around the home? Yes  If so, are there any without handrails? No   Home free of loose throw rugs in walkways, pet beds, electrical cords, etc? Yes  Adequate lighting in your home to reduce risk of falls? Yes   ASSISTIVE DEVICES UTILIZED TO PREVENT FALLS:  Life alert? No  Use of a cane, walker or w/c? No  Grab bars in the bathroom? No  Shower chair or bench in shower? No  Elevated toilet seat or a handicapped toilet? No    TIMED UP AND GO:  Was the test performed? Yes .  Length of time to ambulate 10 feet: 8 sec.   GAIT:  Appearance of gait: Gait steady and fast without the use of an assistive device. ssed.  Intervention(s) required? No   DME/home health order needed?  No    Depression Screen PHQ 2/9 Scores 06/01/2019 03/04/2019  PHQ - 2 Score 0 0    Cognitive Function     6CIT Screen 06/01/2019  What Year? 0 points  What month? 0 points  What time? 0 points  Count back from 20 0 points  Months in reverse 0 points    Repeat phrase 0 points  Total Score 0    Immunization History  Administered Date(s) Administered  . Influenza,inj,Quad PF,6+ Mos 04/15/2017, 05/18/2018  . Pneumococcal Conjugate-13 01/14/2018  . Pneumococcal Polysaccharide-23 12/05/2014, 06/17/2017  . Tdap 12/05/2014    Qualifies for Shingles Vaccine? Not indicated   Tdap: up to date.  Flu Vaccine: Due for Flu vaccine. Will receive at [facility]   Pneumococcal Vaccine: up to date    Screening Tests Health Maintenance  Topic Date Due  . FOOT EXAM  12/15/1984  . URINE MICROALBUMIN  12/15/1984  . INFLUENZA VACCINE  08/10/2019 (Originally 12/11/2018)  . HEMOGLOBIN A1C  07/17/2019  . OPHTHALMOLOGY EXAM  02/25/2020  . TETANUS/TDAP  12/04/2024  . PNEUMOCOCCAL POLYSACCHARIDE VACCINE AGE 41-64 HIGH RISK  Completed  . HIV Screening  Completed   Cancer Screenings:  Colorectal Screening: not indicated   Lung Cancer Screening: (Low Dose CT Chest recommended if Age 35-80 years, 30 pack-year currently smoking OR have quit w/in 15years.) does not qualify.     Additional Screening:  Hepatitis C Screening: does not qualify  Dental Screening: Recommended annual dental exams for proper oral hygiene  Community Resource Referral:  CRR required this visit?  No        Plan:  I have personally reviewed and addressed the Medicare Annual Wellness questionnaire and have noted the following in the patient's chart:  A. Medical and social history B. Use of alcohol, tobacco or illicit drugs  C. Current medications and supplements D. Functional ability and status E.  Nutritional status F.  Physical activity G. Advance directives H. List of other physicians I.  Hospitalizations, surgeries, and ER visits in previous 12 months J.  [place] such as hearing and vision  if needed, cognitive and depression L. Referrals and appointments   In addition, I have reviewed and discussed with patient certain preventive protocols, quality  metrics, and best practice recommendations. A written personalized care plan for preventive services as well as general preventive health recommendations were provided to patient.   Signed,   Bevelyn Ngo, LPN  [id number] Nurse Health Advisor  Nurse Notes: none

## 2019-06-01 NOTE — Patient Instructions (Addendum)
Gabriel Huff , Thank you for taking time to come for your Medicare Wellness Visit. I appreciate your ongoing commitment to your health goals. Please review the following plan we discussed and let me know if I can assist you in the future.   Screening recommendations/referrals: Colonoscopy: not indicated  Recommended yearly ophthalmology/optometry visit for glaucoma screening and checkup Recommended yearly dental visit for hygiene and checkup  Vaccinations: Influenza vaccine: due now - will receive at [place].  Pneumococcal vaccine: up to date  Tdap vaccine: up to date  Shingles vaccine: not indicated     Advanced directives: Advance directive discussed with you today. Even though you declined this today please call our office should you change your mind and we can give you the proper paperwork for you to fill out.  Conditions/risks identified: diabetic. Already in contact with chronic care management team.   Next appointment: Follow up in one year for your annual wellness visit   Preventive Care 40-64 Years, Male Preventive care refers to lifestyle choices and visits with your health care provider that can promote health and wellness. What does preventive care include?  A yearly physical exam. This is also called an annual well check.  Dental exams once or twice a year.  Routine eye exams. Ask your health care provider how often you should have your eyes checked.  Personal lifestyle choices, including:  Daily care of your teeth and gums.  Regular physical activity.  Eating a healthy diet.  Avoiding tobacco and drug use.  Limiting alcohol use.  Practicing safe sex.  Taking low-dose aspirin every day starting at age 88. What happens during an annual well check? The services and screenings done by your health care provider during your annual well check will depend on your age, overall health, lifestyle risk factors, and family history of disease. Counseling  Your health care  provider may ask you questions about your:  Alcohol use.  Tobacco use.  Drug use.  Emotional well-being.  Home and relationship well-being.  Sexual activity.  Eating habits.  Work and work Statistician. Screening  You may have the following tests or measurements:  Height, weight, and BMI.  Blood pressure.  Lipid and cholesterol levels. These may be checked every 5 years, or more frequently if you are over 62 years old.  Skin check.  Lung cancer screening. You may have this screening every year starting at age 33 if you have a 30-pack-year history of smoking and currently smoke or have quit within the past 15 years.  Fecal occult blood test (FOBT) of the stool. You may have this test every year starting at age 64.  Flexible sigmoidoscopy or colonoscopy. You may have a sigmoidoscopy every 5 years or a colonoscopy every 10 years starting at age 79.  Prostate cancer screening. Recommendations will vary depending on your family history and other risks.  Hepatitis C blood test.  Hepatitis B blood test.  Sexually transmitted disease (STD) testing.  Diabetes screening. This is done by checking your blood sugar (glucose) after you have not eaten for a while (fasting). You may have this done every 1-3 years. Discuss your test results, treatment options, and if necessary, the need for more tests with your health care provider. Vaccines  Your health care provider may recommend certain vaccines, such as:  Influenza vaccine. This is recommended every year.  Tetanus, diphtheria, and acellular pertussis (Tdap, Td) vaccine. You may need a Td booster every 10 years.  Zoster vaccine. You may need this after  age 54.  Pneumococcal 13-valent conjugate (PCV13) vaccine. You may need this if you have certain conditions and have not been vaccinated.  Pneumococcal polysaccharide (PPSV23) vaccine. You may need one or two doses if you smoke cigarettes or if you have certain conditions. Talk  to your health care provider about which screenings and vaccines you need and how often you need them. This information is not intended to replace advice given to you by your health care provider. Make sure you discuss any questions you have with your health care provider. Document Released: 05/25/2015 Document Revised: 01/16/2016 Document Reviewed: 02/27/2015 Elsevier Interactive Patient Education  2017 [place] Prevention in the Home Falls can cause injuries. They can happen to people of all ages. There are many things you can do to make your home safe and to help prevent falls. What can I do on the outside of my home?  Regularly fix the edges of walkways and driveways and fix any cracks.  Remove anything that might make you trip as you walk through a door, such as a raised step or threshold.  Trim any bushes or trees on the path to your home.  Use bright outdoor lighting.  Clear any walking paths of anything that might make someone trip, such as rocks or tools.  Regularly check to see if handrails are loose or broken. Make sure that both sides of any steps have handrails.  Any raised decks and porches should have guardrails on the edges.  Have any leaves, snow, or ice cleared regularly.  Use sand or salt on walking paths during winter.  Clean up any spills in your garage right away. This includes oil or grease spills. What can I do in the bathroom?  Use night lights.  Install grab bars by the toilet and in the tub and shower. Do not use towel bars as grab bars.  Use non-skid mats or decals in the tub or shower.  If you need to sit down in the shower, use a plastic, non-slip stool.  Keep the floor dry. Clean up any water that spills on the floor as soon as it happens.  Remove soap buildup in the tub or shower regularly.  Attach bath mats securely with double-sided non-slip rug tape.  Do not have throw rugs and other things on the floor that can make you trip.  What can I do in the bedroom?  Use night lights.  Make sure that you have a light by your bed that is easy to reach.  Do not use any sheets or blankets that are too big for your bed. They should not hang down onto the floor.  Have a firm chair that has side arms. You can use this for support while you get dressed.  Do not have throw rugs and other things on the floor that can make you trip. What can I do in the kitchen?  Clean up any spills right away.  Avoid walking on wet floors.  Keep items that you use a lot in easy-to-reach places.  If you need to reach something above you, use a strong step stool that has a grab bar.  Keep electrical cords out of the way.  Do not use floor polish or wax that makes floors slippery. If you must use wax, use non-skid floor wax.  Do not have throw rugs and other things on the floor that can make you trip. What can I do with my stairs?  Do not leave any  items on the stairs.  Make sure that there are handrails on both sides of the stairs and use them. Fix handrails that are broken or loose. Make sure that handrails are as long as the stairways.  Check any carpeting to make sure that it is firmly attached to the stairs. Fix any carpet that is loose or worn.  Avoid having throw rugs at the top or bottom of the stairs. If you do have throw rugs, attach them to the floor with carpet tape.  Make sure that you have a light switch at the top of the stairs and the bottom of the stairs. If you do not have them, ask someone to add them for you. What else can I do to help prevent falls?  Wear shoes that:  Do not have high heels.  Have rubber bottoms.  Are comfortable and fit you well.  Are closed at the toe. Do not wear sandals.  If you use a stepladder:  Make sure that it is fully opened. Do not climb a closed stepladder.  Make sure that both sides of the stepladder are locked into place.  Ask someone to hold it for you, if possible.   Clearly mark and make sure that you can see:  Any grab bars or handrails.  First and last steps.  Where the edge of each step is.  Use tools that help you move around (mobility aids) if they are needed. These include:  Canes.  Walkers.  Scooters.  Crutches.  Turn on the lights when you go into a dark area. Replace any light bulbs as soon as they burn out.  Set up your furniture so you have a clear path. Avoid moving your furniture around.  If any of your floors are uneven, fix them.  If there are any pets around you, be aware of where they are.  Review your medicines with your doctor. Some medicines can make you feel dizzy. This can increase your chance of falling. Ask your doctor what other things that you can do to help prevent falls. This information is not intended to replace advice given to you by your health care provider. Make sure you discuss any questions you have with your health care provider. Document Released: 02/22/2009 Document Revised: 10/04/2015 Document Reviewed: 06/02/2014 Elsevier Interactive Patient Education  2017 [employer].

## 2019-07-15 ENCOUNTER — Ambulatory Visit: Payer: Medicare Other | Admitting: Nurse Practitioner

## 2019-07-19 ENCOUNTER — Ambulatory Visit (INDEPENDENT_AMBULATORY_CARE_PROVIDER_SITE_OTHER): Payer: Medicare Other | Admitting: Pharmacist

## 2019-07-19 DIAGNOSIS — I152 Hypertension secondary to endocrine disorders: Secondary | ICD-10-CM

## 2019-07-19 DIAGNOSIS — I10 Essential (primary) hypertension: Secondary | ICD-10-CM

## 2019-07-19 DIAGNOSIS — E785 Hyperlipidemia, unspecified: Secondary | ICD-10-CM | POA: Diagnosis not present

## 2019-07-19 DIAGNOSIS — E1069 Type 1 diabetes mellitus with other specified complication: Secondary | ICD-10-CM | POA: Diagnosis not present

## 2019-07-19 DIAGNOSIS — E10649 Type 1 diabetes mellitus with hypoglycemia without coma: Secondary | ICD-10-CM

## 2019-07-19 DIAGNOSIS — E1159 Type 2 diabetes mellitus with other circulatory complications: Secondary | ICD-10-CM

## 2019-07-19 NOTE — Patient Instructions (Signed)
Visit Information  Goals Addressed            This Visit's Progress     Patient Stated   . COMPLETED: PharmD "I have a lot of medications" (pt-stated)       CARE PLAN ENTRY (see longtitudinal plan of care for additional care plan information)  Current Barriers:  . Polypharmacy; complex patient with multiple comorbidities including T1DM (diagnosed at age 44 in 23), s/p pancreas/kidney transplant in 03/2016 (pancreas failed, patient on list for repeat);   . No showed PCP appointment last week. He notes he forgot about it o T1DM: follows w/ Dr. Honor Junes at [facility]. Last A1c 7.9% in 1/21; At last visit last week, noted that pump settings were adjusted, and patient counseled to bolus 20-30 minutes before eating; appeared he had been waiting too late, then sugars would increase after meals and he would bolus to correct, which would then stack and cause hypo - Notes that he is trying to remember to take bolus 20-30 minutes before a meal, but has forgotten ~2 times since last appointment - Denies any medication access concerns o S/p kidney/pancreas transplant (pancreas failed): follows at [facility] transplant working up for pancreas transplant; last appt 12/2018, f/u scheduled later this month; Tacrolimus 1.5 mg BID, mycophenolate 360 mg BID, prednisone 5 mg daily. o ASCVD risk reduction: ASA 81 mg, atorvastatin 20 mg daily. Last LDL 88 in 05/2018, mentioned in [place] note that this is at goal. o Health maintenance: notes he takes daily multivitamin, Vitamin C, and OTC potassium daily  Pharmacist Clinical Goal(s):  [name] Over the next 90 days, patient will work with PharmD and provider towards optimized medication management  Interventions: . Comprehensive medication review performed; medication list updated in electronic medical record.  [name] Up to date on fills for atorvastatin  . Reviewed notes from recent endocrinology appointment. Reiterated principals addressed by Dr. Honor Junes.  Discussed that if he forgets to take insulin 20-30 minutes before eating, to go ahead and take bolus but then delay starting to eat if possible.  . Encouraged to call clinic and schedule a follow up appointment w/ PCP.  . D/t no medication related concerns and medication dosing being managed by specialist teams, will collaborate w/ RN CM for follow up. .  Patient Self Care Activities:  . Patient will take medications as prescribed  Please see past updates related to this goal by clicking on the "Past Updates" button in the selected goal         Patient verbalizes understanding of instructions provided today.    Plan:  - Will collaborate w/ RN CM for follow up.    [name], PharmD, [place] [phone]

## 2019-07-19 NOTE — Chronic Care Management (AMB) (Signed)
Chronic Care Management   Follow Up Note   07/19/2019 Name: Gabriel Maneri Caillouet Sr. MRN: [id number] DOB: November 25, 1975  Referred by: Gabriel Lick, NP Reason for referral : Chronic Care Management (Medication Management)   Gabriel Beams Zelaya Sr. is a 44 y.o. year old male who is a primary care patient of Gabriel Huff, Gabriel Faster, NP. The CCM team was consulted for assistance with chronic disease management and care coordination needs.    Contacted patient for medication management review.  Review of patient status, including review of consultants reports, relevant laboratory and other test results, and collaboration with appropriate care team members and the patient's provider was performed as part of comprehensive patient evaluation and provision of chronic care management services.    SDOH (Social Determinants of Health) assessments performed: Yes See Care Plan activities for detailed interventions related to [facility])     Outpatient Encounter Medications as of 07/19/2019  Medication Sig Note  . Ascorbic Acid (VITAMIN C) 100 MG tablet Take 500 mg by mouth daily.    Gabriel Huff aspirin 81 MG chewable tablet Chew 81 mg by mouth daily.   Gabriel Huff atorvastatin (LIPITOR) 20 MG tablet Take 20 mg by mouth daily.   . insulin aspart (NOVOLOG) 100 UNIT/ML injection 8 units with breakfast, 8 units with lunch and 7 units with dinner, correction scale up to 5 units with meals 04/06/2019: Per insulin pump  . Multiple Vitamins-Minerals (MULTIVITAMIN WITH MINERALS) tablet Take 1 tablet by mouth daily.   . mycophenolate (MYFORTIC) 180 MG EC tablet Take 360 mg by mouth 2 (two) times daily.    . Potassium 99 MG TABS Take 99 mg by mouth.   . predniSONE (DELTASONE) 5 MG tablet Take 5 mg by mouth daily.   . tacrolimus (PROGRAF) 0.5 MG capsule Take 1.5 mg by mouth every 12 (twelve) hours.    Gabriel Huff GLUCAGON EMERGENCY 1 MG injection INJECT 1 ML INTO MUSCLE AS DIRECTED FOR LOW BLOOD SUGAR (PER HYPOGLYCEMIA PROTOCOL)FOR UP TO 2 DOSES    No  facility-administered encounter medications on file as of 07/19/2019.     Objective:   Goals Addressed            This Visit's Progress     Patient Stated   . COMPLETED: PharmD "I have a lot of medications" (pt-stated)       CARE PLAN ENTRY (see longtitudinal plan of care for additional care plan information)  Current Barriers:  . Polypharmacy; complex patient with multiple comorbidities including T1DM (diagnosed at age 69 in 65), s/p pancreas/kidney transplant in 03/2016 (pancreas failed, patient on list for repeat);   . No showed PCP appointment last week. He notes he forgot about it o T1DM: follows w/ Dr. Honor Huff at [facility] Endocr[facility]. Last A1c 7.9% in 1/21; At last visit last week, noted that pump settings were adjusted, and patient counseled to bolus 20-30 minutes before eating; appeared he had been waiting too late, then sugars would increase after meals and he would bolus to correct, which would then stack and cause hypo - Notes that he is trying to remember to take bolus 20-30 minutes before a meal, but has forgotten ~2 times since last appointment - Denies any medication access concerns o S/p kidney/pancreas transplant (pancreas failed): follows at [facility] transplant working up for pancreas transplant; last appt 12/2018, f/u scheduled later this month; Tacrolimus 1.5 mg BID, mycophenolate 360 mg BID, prednisone 5 mg daily. o ASCVD risk reduction: ASA 81 mg, atorvastatin 20 mg daily. Last LDL 88  in 05/2018, mentioned in Gabriel Huff note that this is at goal. o Health maintenance: notes he takes daily multivitamin, Vitamin C, and OTC potassium daily  Pharmacist Clinical Goal(s):  Gabriel Huff Over the next 90 days, patient will work with PharmD and provider towards optimized medication management  Interventions: . Comprehensive medication review performed; medication list updated in electronic medical record.  Gabriel Huff Up to date on fills for atorvastatin  . Reviewed notes from recent endocrinology  appointment. Reiterated principals addressed by Dr. Honor Huff. Discussed that if he forgets to take insulin 20-30 minutes before eating, to go ahead and take bolus but then delay starting to eat if possible.  . Encouraged to call clinic and schedule a follow up appointment w/ PCP.  . D/t no medication related concerns and medication dosing being managed by specialist teams, will collaborate w/ RN CM for follow up. .  Patient Self Care Activities:  . Patient will take medications as prescribed  Please see past updates related to this goal by clicking on the "Past Updates" button in the selected goal          Plan:  - Will collaborate w/ RN CM for follow up.    Gabriel Huff, PharmD, [place] [phone]

## 2019-08-19 ENCOUNTER — Telehealth: Payer: Self-pay | Admitting: Nurse Practitioner

## 2019-08-19 NOTE — Chronic Care Management (AMB) (Signed)
  Care Management   Note  08/19/2019 Name: Gabriel Arroyave Lamson Sr. MRN: [id number] DOB: 13-Nov-1975  Gabriel Beams Molinari Sr. is a 44 y.o. year old male who is a primary care patient of [name], NP and is actively engaged with the care management team. I reached out to [place]. by phone today to assist with re-scheduling a follow up visit with the RN Case Manager  Follow up plan: Telephone appointment with care management team member scheduled for: 09/28/2019.  [place], [place] [address] Direct Dial: [phone] [email or web address]: [email or web address]

## 2019-08-24 ENCOUNTER — Telehealth: Payer: Self-pay

## 2019-09-28 ENCOUNTER — Ambulatory Visit: Payer: Self-pay | Admitting: General Practice

## 2019-09-28 ENCOUNTER — Telehealth: Payer: Medicare Other

## 2019-09-28 NOTE — Chronic Care Management (AMB) (Signed)
  Chronic Care Management   Outreach Note  09/28/2019 Name: Gabriel Sparling Wentzel Sr. MRN: [id number] DOB: 03-30-1976  Referred by: [name], NP Reason for referral : Chronic Care Management (Initial outreach for [facility] Chronic Disease Management and Care Coordination Needs )   An unsuccessful telephone outreach was attempted today. The patient was referred to the case management team for assistance with care management and care coordination.   Follow Up Plan: A HIPPA compliant phone message was left for the patient providing contact information and requesting a return call.   Noreene Larsson RN, MSN, [facility] Mobile: [phone]

## 2019-11-29 ENCOUNTER — Telehealth: Payer: Medicare Other | Admitting: General Practice

## 2019-11-29 ENCOUNTER — Ambulatory Visit (INDEPENDENT_AMBULATORY_CARE_PROVIDER_SITE_OTHER): Payer: Medicare Other | Admitting: General Practice

## 2019-11-29 DIAGNOSIS — I10 Essential (primary) hypertension: Secondary | ICD-10-CM

## 2019-11-29 DIAGNOSIS — E1069 Type 1 diabetes mellitus with other specified complication: Secondary | ICD-10-CM | POA: Diagnosis not present

## 2019-11-29 DIAGNOSIS — Z940 Kidney transplant status: Secondary | ICD-10-CM

## 2019-11-29 DIAGNOSIS — I152 Hypertension secondary to endocrine disorders: Secondary | ICD-10-CM

## 2019-11-29 DIAGNOSIS — E10649 Type 1 diabetes mellitus with hypoglycemia without coma: Secondary | ICD-10-CM

## 2019-11-29 DIAGNOSIS — D849 Immunodeficiency, unspecified: Secondary | ICD-10-CM

## 2019-11-29 DIAGNOSIS — E785 Hyperlipidemia, unspecified: Secondary | ICD-10-CM | POA: Diagnosis not present

## 2019-11-29 DIAGNOSIS — E1159 Type 2 diabetes mellitus with other circulatory complications: Secondary | ICD-10-CM

## 2019-11-29 NOTE — Patient Instructions (Signed)
Visit Information  Goals Addressed              This Visit's Progress   .  RNCM- Pt- "I want to stay healthy" (pt-stated)        Current Barriers:  . Chronic Disease Management support and education needs related to Diabetes type 1  Nurse Case Manager Clinical Goal(s):  [name] Over the next 120 days, patient will work with [facility] to address needs related to Diabetes  Interventions:  . Evaluation of current treatment plan related to Diabetes  and patient's adherence to plan as established by provider. The patient continues to be seen at [facility] and [facility]  . Discussed plans with patient for ongoing care management follow up and provided patient with direct contact information for care management team . Advised patient, providing education and rationale, to check cbg QID and record, calling PCP or Endocrinologist for findings outside established parameters.  11-29-2019: The patient admits he does not take his blood sugars like he should. Earlier today it was 103.  Review of the patients hemoglobin A1C. Most recent A1C at [facility] was 7.6 on 11-01-2019.  Encouraged the patient to check blood sugars regular and record.  . Pharmacy referral for patient trying to obtain [employer] 2 sensors with alarm system for lows. Endocrinologist office also working on this. Reports continuing to have lows(53). 11-29-2019: The patient continues to have low episodes. States sometimes it is like a wave.  Education and support given.  . Empathetic listening as patient recounted recent hypoglycemic episode where he ended up being admitted to the hospital. Patient stating he still feels "fuzzy headed" and reports his memory is not right. Continues to work with [facility] to recover memory. 11-29-2019: The patient verbalized he is still having issues with his memory. This is frustrating to him. Talked about writing things down and using things like this to help remember.  . Review of preventative measures. The patient has eye exams  regularly. Sees specialist regularly. Has not had office visit with pcp since December 2020 but has been in contact with the office.  Review of CCM team role and provided the patient with the [facility] number for needs or questions.  . Evaluation of support system: Patient is staying with his dad right now who he states is his main support person.  . Evaluation of SDOH: Patient denies needs for transportation, financial assistance or medication assistance at this time.   Patient Self Care Activities:  . Self administers medications as prescribed . Attends church or other social activities . Performs ADL's independently . Performs IADL's independently . Recent hypoglycemic episode resulting in hospitalization and short term memory loss  Please see past updates related to this goal by clicking on the "Past Updates" button in the selected goal      .  RNCM: pt:"I am doing the best I can" (pt-stated)        CARE PLAN ENTRY (see longtitudinal plan of care for additional care plan information)  Current Barriers:  . Chronic Disease Management support, education, and care coordination needs related to HTN, HLD, and history of kidney transplant  Clinical Goal(s) related to HTN, HLD, and history of kidney transplant  :  Over the next 120 days, patient will:  . Work with the care management team to address educational, disease management, and care coordination needs  . Begin or continue self health monitoring activities as directed today  adhere to a heart healthy/ADA diet and follow recommendations of pcp and  specialist concerning preventative care for chronic conditions. . Call provider office for new or worsened signs and symptoms Blood glucose findings outside established parameters, Blood pressure findings outside established parameters, and New or worsened symptom related to immunosuppression and other chronic conditions . Call care management team with questions or concerns . Verbalize basic  understanding of patient centered plan of care established today  Interventions related to HTN, HLD, and history of kidney transplant :  . Evaluation of current treatment plans and patient's adherence to plan as established by provider . Assessed patient understanding of disease states . Assessed patient's education and care coordination needs . Provided disease specific education to patient  . Collaborated with appropriate clinical care team members regarding patient needs  Patient Self Care Activities related to HTN, HLD, and history of kidney transplant :  . Patient is unable to independently self-manage chronic health conditions  Initial goal documentation        Patient verbalizes understanding of instructions provided today.   The care management team will reach out to the patient again over the next 30 to 60 days.   Noreene Larsson RN, MSN, [facility] Mobile: [phone]

## 2019-11-29 NOTE — Chronic Care Management (AMB) (Signed)
Chronic Care Management   Follow Up Note   11/29/2019 Name: Gabriel [facility] Sr. MRN: [id number] DOB: 05-31-1975  Referred by: [name], NP Reason for referral : Chronic Care Management (RNCM Request for outreach from pharm D for Chronic Disease Managment and Care Coordination Needs)   [name] is a 44 y.o. year old male who is a primary care patient of Cannady, [name], NP. The CCM team was consulted for assistance with chronic disease management and care coordination needs.    Review of patient status, including review of consultants reports, relevant laboratory and other test results, and collaboration with appropriate care team members and the patient's provider was performed as part of comprehensive patient evaluation and provision of chronic care management services.    SDOH (Social Determinants of Health) assessments performed: Yes See Care Plan activities for detailed interventions related to [facility])     Outpatient Encounter Medications as of 11/29/2019  Medication Sig Note  . Ascorbic Acid (VITAMIN C) 100 MG tablet Take 500 mg by mouth daily.    [name] aspirin 81 MG chewable tablet Chew 81 mg by mouth daily.   [name] atorvastatin (LIPITOR) 20 MG tablet Take 20 mg by mouth daily.   [name] GLUCAGON EMERGENCY 1 MG injection INJECT 1 ML INTO MUSCLE AS DIRECTED FOR LOW BLOOD SUGAR (PER HYPOGLYCEMIA PROTOCOL)FOR UP TO 2 DOSES   . insulin aspart (NOVOLOG) 100 UNIT/ML injection 8 units with breakfast, 8 units with lunch and 7 units with dinner, correction scale up to 5 units with meals 04/06/2019: Per insulin pump  . Multiple Vitamins-Minerals (MULTIVITAMIN WITH MINERALS) tablet Take 1 tablet by mouth daily.   . mycophenolate (MYFORTIC) 180 MG EC tablet Take 360 mg by mouth 2 (two) times daily.    . Potassium 99 MG TABS Take 99 mg by mouth.   . predniSONE (DELTASONE) 5 MG tablet Take 5 mg by mouth daily.   . tacrolimus (PROGRAF) 0.5 MG capsule Take 1.5 mg by mouth every 12 (twelve)  hours.     No facility-administered encounter medications on file as of 11/29/2019.     Objective:  BP Readings from Last 3 Encounters:  06/01/19 102/64  04/15/19 127/85  03/04/19 135/89    Goals Addressed              This Visit's Progress   .  RNCM- Pt- "I want to stay healthy" (pt-stated)        Current Barriers:  . Chronic Disease Management support and education needs related to Diabetes type 1  Nurse Case Manager Clinical Goal(s):  [name] Over the next 120 days, patient will work with [facility] to address needs related to Diabetes  Interventions:  . Evaluation of current treatment plan related to Diabetes  and patient's adherence to plan as established by provider. The [facility] to be [facility] at [facility]  . Discussed plans with patient for ongoing care management follow up and provided patient with direct contact information for care management team . Advised patient, providing education and rationale, to check cbg QID and record, calling PCP or Endocrinologist for findings outside established parameters.  11-29-2019: The patient admits he does not take his blood sugars like he should. Earlier today it was 103.  Review of the patients hemoglobin A1C. Most recent A1C at [facility] was 7.6 on 11-01-2019.  Encouraged the patient to check blood sugars regular and record.  . Pharmacy referral for patient trying to obtain [place] 2 sensors with  alarm system for lows. Endocrinologist office also working on this. Reports continuing to have lows(53). 11-29-2019: The patient continues to have low episodes. States sometimes it is like a wave.  Education and support given.  . Empathetic listening as patient recounted recent hypoglycemic episode where he ended up being admitted to [facility]. Patient stating he still feels "fuzzy headed" and reports his memory is not right. Continues to work with [facility] to recover memory. 11-29-2019: The patient verbalized he is still having issues with  his memory. This is frustrating to him. Talked about writing things down and using things like this to help remember.  . Review of preventative measures. The patient has eye exams regularly. Sees specialist regularly. Has not had office visit with pcp since December 2020 but has been in contact with the office.  Review of CCM team role and provided the patient with the [facility] number for needs or questions.  . Evaluation of support system: Patient is staying with his dad right now who he states is his main support person.  . Evaluation of SDOH: Patient denies needs for transportation, financial assistance or medication assistance at this time.   Patient Self Care Activities:  . Self administers medications as prescribed . Attends church or other social activities . Performs ADL's independently . Performs IADL's independently . Recent hypoglycemic episode resulting in hospitalization and short term memory loss  Please see past updates related to this goal by clicking on the "Past Updates" button in the selected goal      .  RNCM: pt:"I am doing the best I can" (pt-stated)        CARE PLAN ENTRY (see longtitudinal plan of care for additional care plan information)  Current Barriers:  . Chronic Disease Management support, education, and care coordination needs related to HTN, HLD, and history of kidney transplant  Clinical Goal(s) related to HTN, HLD, and history of kidney transplant  :  Over the next 120 days, patient will:  . Work with the care management team to address educational, disease management, and care coordination needs  . Begin or continue self health monitoring activities as directed today  adhere to a heart healthy/ADA diet and follow recommendations of pcp and specialist concerning preventative care for chronic conditions. . Call provider office for new or worsened signs and symptoms Blood glucose findings outside established parameters, Blood pressure findings outside established  parameters, and New or worsened symptom related to immunosuppression and other chronic conditions . Call care management team with questions or concerns . Verbalize basic understanding of patient centered plan of care established today  Interventions related to HTN, HLD, and history of kidney transplant :  . Evaluation of current treatment plans and patient's adherence to plan as established by provider . Assessed patient understanding of disease states . Assessed patient's education and care coordination needs . Provided disease specific education to patient  . Collaborated with appropriate clinical care team members regarding patient needs  Patient Self Care Activities related to HTN, HLD, and history of kidney transplant :  . Patient is unable to independently self-manage chronic health conditions  Initial goal documentation         Plan:   The care management team will reach out to the patient again over the next 30 to 60 days.    Noreene Larsson RN, MSN, [facility] Mobile: [phone]

## 2020-01-24 ENCOUNTER — Ambulatory Visit (INDEPENDENT_AMBULATORY_CARE_PROVIDER_SITE_OTHER): Payer: Medicare Other | Admitting: General Practice

## 2020-01-24 ENCOUNTER — Telehealth: Payer: Medicare Other | Admitting: General Practice

## 2020-01-24 DIAGNOSIS — E10649 Type 1 diabetes mellitus with hypoglycemia without coma: Secondary | ICD-10-CM | POA: Diagnosis not present

## 2020-01-24 DIAGNOSIS — E1159 Type 2 diabetes mellitus with other circulatory complications: Secondary | ICD-10-CM

## 2020-01-24 DIAGNOSIS — I10 Essential (primary) hypertension: Secondary | ICD-10-CM

## 2020-01-24 DIAGNOSIS — Z940 Kidney transplant status: Secondary | ICD-10-CM

## 2020-01-24 DIAGNOSIS — E1069 Type 1 diabetes mellitus with other specified complication: Secondary | ICD-10-CM

## 2020-01-24 DIAGNOSIS — E785 Hyperlipidemia, unspecified: Secondary | ICD-10-CM | POA: Diagnosis not present

## 2020-01-24 DIAGNOSIS — D849 Immunodeficiency, unspecified: Secondary | ICD-10-CM

## 2020-01-24 NOTE — Chronic Care Management (AMB) (Signed)
Chronic Care Management   Follow Up Note   01/24/2020 Name: Gabriel Nettleton Macbeth Sr. MRN: [id number] DOB: May 08, 1976  Referred by: [name], NP Reason for referral : Chronic Care Management (RNCM Chronic Disease Management and Care Coordination Needs)   Gabriel Cefalu Erny Sr. is a 44 y.o. year old male who is a primary care patient of Cannady, [name], NP. The CCM team was consulted for assistance with chronic disease management and care coordination needs.    Review of patient status, including review of consultants reports, relevant laboratory and other test results, and collaboration with appropriate care team members and the patient's provider was performed as part of comprehensive patient evaluation and provision of chronic care management services.    SDOH (Social Determinants of Health) assessments performed: Yes See Care Plan activities for detailed interventions related to SDOH)  SDOH Interventions     Most Recent Value  SDOH Interventions  Physical Activity Interventions Other (Comments)  [goes to the gym daily and works out 30 to 40 minutes]  Social Connections Interventions Other (Comment)  [The patient has good support system]       Outpatient Encounter Medications as of 01/24/2020  Medication Sig Note  . Ascorbic Acid (VITAMIN C) 100 MG tablet Take 500 mg by mouth daily.    [name] aspirin 81 MG chewable tablet Chew 81 mg by mouth daily.   [name] atorvastatin (LIPITOR) 20 MG tablet Take 20 mg by mouth daily.   [name] GLUCAGON EMERGENCY 1 MG injection INJECT 1 ML INTO MUSCLE AS DIRECTED FOR LOW BLOOD SUGAR (PER HYPOGLYCEMIA PROTOCOL)FOR UP TO 2 DOSES   . insulin aspart (NOVOLOG) 100 UNIT/ML injection 8 units with breakfast, 8 units with lunch and 7 units with dinner, correction scale up to 5 units with meals 04/06/2019: Per insulin pump  . Multiple Vitamins-Minerals (MULTIVITAMIN WITH MINERALS) tablet Take 1 tablet by mouth daily.   . mycophenolate (MYFORTIC) 180 MG EC tablet Take 360 mg  by mouth 2 (two) times daily.    . Potassium 99 MG TABS Take 99 mg by mouth.   . predniSONE (DELTASONE) 5 MG tablet Take 5 mg by mouth daily.   . tacrolimus (PROGRAF) 0.5 MG capsule Take 1.5 mg by mouth every 12 (twelve) hours.     No facility-administered encounter medications on file as of 01/24/2020.     Objective:  BP Readings from Last 3 Encounters:  06/01/19 102/64  04/15/19 127/85  03/04/19 135/89    Goals Addressed              This Visit's Progress   .  RNCM- Pt- "I want to stay healthy" (pt-stated)        Current Barriers:  . Chronic Disease Management support and education needs related to Diabetes type 1  Nurse Case Manager Clinical Goal(s):  [name] Over the next 120 days, patient will work with [facility] to address needs related to Diabetes  Interventions:  . Evaluation of current treatment plan related to Diabetes  and patient's adherence to plan as established by provider. The patient continues to be seen at [facility] and [place]  . Discussed plans with patient for ongoing care management follow up and provided patient with direct contact information for care management team . Advised patient, providing education and rationale, to check cbg QID and record, calling PCP or Endocrinologist for findings outside established parameters.  01-24-2020: The patient has the [facility] and checks his blood sugars often. This am it was 138.  Review of the patients hemoglobin A1C. Most recent A1C at [facility] was 7.6 on 11-01-2019.  [name] Pharmacy referral for patient trying to obtain [place] 2 sensors with alarm system for lows. Endocrinologist office also working on this. Reports continuing to have lows(53). 01-24-2020: The patient continues to have low episodes. States sometimes it is like a wave. The [employer] does record lows and alerts when his blood sugar drops. He states that his blood sugar was 138 this am and when he went to the gym and worked out for about 40 minutes it dropped to  56.  He ate something and it came back up. Education on blood sugar dropping with 15 minutes of moderate exercise and to have on hand sugary snacks.  Education and support given.  . Empathetic listening as patient recounted recent hypoglycemic episode where he ended up being admitted to the hospital. Patient stating he still feels "fuzzy headed" and reports his memory is not right. Continues to work with [facility] to recover memory. 11-29-2019: The patient verbalized he is still having issues with his memory. This is frustrating to him. Talked about writing things down and using things like this to help remember.  . Review of preventative measures. The patient has eye exams regularly. Sees specialist regularly. Has not had office visit with pcp since December 2020 but has been in contact with the office.  Review of CCM team role and provided the patient with the [facility] number for needs or questions.  . Evaluation of support system: Patient is back at his home and his son, daughter in law and family are living with him. He is thankful for the support of his family.  . Evaluation of SDOH: Patient denies needs for transportation, financial assistance or medication assistance at this time.   Patient Self Care Activities:  . Self administers medications as prescribed . Attends church or other social activities . Performs ADL's independently . Performs IADL's independently . Recent hypoglycemic episode resulting in hospitalization and short term memory loss  Please see past updates related to this goal by clicking on the "Past Updates" button in the selected goal      .  RNCM: pt:"I am doing the best I can" (pt-stated)        CARE PLAN ENTRY (see longtitudinal plan of care for additional care plan information)  Current Barriers:  . Chronic Disease Management support, education, and care coordination needs related to HTN, HLD, and history of kidney transplant  Clinical Goal(s) related to HTN, HLD, and  history of kidney transplant  :  Over the next 120 days, patient will:  . Work with the care management team to address educational, disease management, and care coordination needs  . Begin or continue self health monitoring activities as directed today  adhere to a heart healthy/ADA diet and follow recommendations of pcp and specialist concerning preventative care for chronic conditions. . Call provider office for new or worsened signs and symptoms Blood glucose findings outside established parameters, Blood pressure findings outside established parameters, and New or worsened symptom related to immunosuppression and other chronic conditions . Call care management team with questions or concerns . Verbalize basic understanding of patient centered plan of care established today  Interventions related to HTN, HLD, and history of kidney transplant :  . Evaluation of current treatment plans and patient's adherence to plan as established by provider.  01-24-2020 The patient states he is doing well and has had follow up appointments with the specialist. No  issues related to kidney transplant. Does not take blood pressures at home but states at MD visits it has been good.  . Assessed patient understanding of disease states. 01-24-2020: Has a good understanding of chronic conditions and how to manage effectively.  . Assessed patient's education and care coordination needs.  Denies any needs at this time.  . Provided disease specific education to patient. 01-24-2020: Review of heart healthy/ADA diet. The patient verbalized compliance with heart healthy/ADA diet. Denies any issues with dietary restrictions or weight gain/loss.  Nash Dimmer with appropriate clinical care team members regarding patient needs. Has worked in the past with the pharmacist. Franco Nones about LCSW and care guides for support.   Patient Self Care Activities related to HTN, HLD, and history of kidney transplant :  . Patient is unable to  independently self-manage chronic health conditions  Please see past updates related to this goal by clicking on the "Past Updates" button in the selected goal          Plan:   Telephone follow up appointment with care management team member scheduled for: 04-04-2020 at 1 pm   [place], MSN, [facility] Mobile: [phone]

## 2020-01-24 NOTE — Patient Instructions (Signed)
Visit Information  Goals Addressed              This Visit's Progress     RNCM- Pt- "I want to stay healthy" (pt-stated)        Current Barriers:   Chronic Disease Management support and education needs related to Diabetes type 1  Nurse Case Manager Clinical Goal(s):   Over the next 120 days, patient will work with [facility] to address needs related to Diabetes  Interventions:   Evaluation of current treatment plan related to Diabetes  and patient's adherence to plan as established by provider. The patient continues to be seen at [facility] and [facility]   Discussed plans with patient for ongoing care management follow up and provided patient with direct contact information for care management team  Advised patient, providing education and rationale, to check cbg QID and record, calling PCP or Endocrinologist for findings outside established parameters.  01-24-2020: The patient has the [facility] and checks his blood sugars often. This am it was 138.   Review of the patients hemoglobin A1C. Most recent A1C at [facility] was 7.6 on 11-01-2019.   Pharmacy referral for patient trying to obtain [place] 2 sensors with alarm system for lows. Endocrinologist office also working on this. Reports continuing to have lows(53). 01-24-2020: The patient continues to have low episodes. States sometimes it is like a wave. The [employer] does record lows and alerts when his blood sugar drops. He states that his blood sugar was 138 this am and when he went to the gym and worked out for about 40 minutes it dropped to 56.  He ate something and it came back up. Education on blood sugar dropping with 15 minutes of moderate exercise and to have on hand sugary snacks.  Education and support given.   Empathetic listening as patient recounted recent hypoglycemic episode where he ended up being admitted to the hospital. Patient stating he still feels "fuzzy headed" and reports his memory is not right. Continues to work  with [facility] to recover memory. 11-29-2019: The patient verbalized he is still having issues with his memory. This is frustrating to him. Talked about writing things down and using things like this to help remember.   Review of preventative measures. The patient has eye exams regularly. Sees specialist regularly. Has not had office visit with pcp since December 2020 but has been in contact with the office.  Review of CCM team role and provided the patient with the [facility] number for needs or questions.   Evaluation of support system: Patient is back at his home and his son, daughter in law and family are living with him. He is thankful for the support of his family.   Evaluation of SDOH: Patient denies needs for transportation, financial assistance or medication assistance at this time.   Patient Self Care Activities:   Self administers medications as prescribed  Attends church or other social activities  Performs ADL's independently  Performs IADL's independently  Recent hypoglycemic episode resulting in hospitalization and short term memory loss  Please see past updates related to this goal by clicking on the "Past Updates" button in the selected goal        RNCM: pt:"I am doing the best I can" (pt-stated)        [facility] (see longtitudinal plan of care for additional care plan information)  Current Barriers:   Chronic Disease Management support, education, and care coordination needs related to HTN, HLD, and  history of kidney transplant  Clinical Goal(s) related to HTN, HLD, and history of kidney transplant  :  Over the next 120 days, patient will:   Work with the care management team to address educational, disease management, and care coordination needs   Begin or continue self health monitoring activities as directed today  adhere to a heart healthy/ADA diet and follow recommendations of pcp and specialist concerning preventative care for chronic conditions.  Call  provider office for new or worsened signs and symptoms Blood glucose findings outside established parameters, Blood pressure findings outside established parameters, and New or worsened symptom related to immunosuppression and other chronic conditions  Call care management team with questions or concerns  Verbalize basic understanding of patient centered plan of care established today  Interventions related to HTN, HLD, and history of kidney transplant :   Evaluation of current treatment plans and patient's adherence to plan as established by provider.  01-24-2020 The patient states he is doing well and has had follow up appointments with the specialist. No issues related to kidney transplant. Does not take blood pressures at home but states at MD visits it has been good.   Assessed patient understanding of disease states. 01-24-2020: Has a good understanding of chronic conditions and how to manage effectively.   Assessed patient's education and care coordination needs.  Denies any needs at this time.   Provided disease specific education to patient. 01-24-2020: Review of heart healthy/ADA diet. The patient verbalized compliance with heart healthy/ADA diet. Denies any issues with dietary restrictions or weight gain/loss.   Collaborated with appropriate clinical care team members regarding patient needs. Has worked in the past with the pharmacist. Franco Nones about LCSW and care guides for support.   Patient Self Care Activities related to HTN, HLD, and history of kidney transplant :   Patient is unable to independently self-manage chronic health conditions  Please see past updates related to this goal by clicking on the "Past Updates" button in the selected goal         Patient verbalizes understanding of instructions provided today.   Telephone follow up appointment with care management team member scheduled for: 04-04-2020 at 1 pm  Noreene Larsson RN, MSN, [facility] Mobile: [phone]

## 2020-03-20 LAB — HM DIABETES EYE EXAM

## 2020-04-04 ENCOUNTER — Telehealth: Payer: Self-pay

## 2020-05-18 ENCOUNTER — Telehealth: Payer: Self-pay | Admitting: General Practice

## 2020-05-18 ENCOUNTER — Telehealth: Payer: Self-pay

## 2020-05-18 NOTE — Telephone Encounter (Signed)
  Chronic Care Management   Outreach Note  05/18/2020 Name: Gabriel Kaufhold Sutter Sr. MRN: [id number] DOB: 1975-10-30  Referred by: [name], NP Reason for referral : Appointment (RNCM: Follow up for Chronic Disease Management and Care Coordination Needs)   An unsuccessful telephone outreach was attempted today. The patient was referred to the case management team for assistance with care management and care coordination.   Follow Up Plan: Telephone follow up appointment with care management team member scheduled for: A HIPAA compliant phone message was left for the patient providing contact information and requesting a return call.   Noreene Larsson RN, MSN, [facility] Mobile: [phone]

## 2020-05-25 ENCOUNTER — Telehealth: Payer: Self-pay

## 2020-05-25 NOTE — Telephone Encounter (Signed)
Spoke with Patient to reschedule f/u and he states that he is doing well and does not wish to schedule at this time. Will call if he needs anything

## 2020-05-25 NOTE — Chronic Care Management (AMB) (Signed)
  Care Management   Note  05/25/2020 Name: Gabriel Bulman Potts Sr. MRN: [id number] DOB: Nov 30, 1975  Gabriel Beams Cue Sr. is a 45 y.o. year old male who is a primary care patient of [name], NP and is actively engaged with the care management team. I reached out to [place]. by phone today to assist with re-scheduling a follow up visit with the RN Case Manager  Follow up plan: Patient declines further follow up and engagement by the care management team. Appropriate care team members and provider have been notified via electronic communication.   Noreene Larsson, [place], [place], [place] [address] Direct Dial: [phone] [name]

## 2020-06-04 ENCOUNTER — Ambulatory Visit (INDEPENDENT_AMBULATORY_CARE_PROVIDER_SITE_OTHER): Payer: Medicare Other

## 2020-06-04 ENCOUNTER — Telehealth: Payer: Self-pay

## 2020-06-04 VITALS — Ht 66.0 in | Wt 159.0 lb

## 2020-06-04 DIAGNOSIS — Z0000 Encounter for general adult medical examination without abnormal findings: Secondary | ICD-10-CM

## 2020-06-04 NOTE — Telephone Encounter (Signed)
Patient consented to a telehealth visit.

## 2020-06-04 NOTE — Progress Notes (Signed)
I connected with Gabriel Huff today by telephone and verified that I am speaking with the correct person using two identifiers. Location patient: home Location provider: work Persons participating in the virtual visit: [place], Lynes LPN.   I discussed the limitations, risks, security and privacy concerns of performing an evaluation and management service by telephone and the availability of in person appointments. I also discussed with the patient that there may be a patient responsible charge related to this service. The patient expressed understanding and verbally consented to this telephonic visit.    Interactive audio and video telecommunications were attempted between this provider and patient, however failed, due to patient having technical difficulties OR patient did not have access to video capability.  We continued and completed visit with audio only.     Vital signs may be patient reported or missing.  Subjective:   Gabriel Huff Sr. is a 45 y.o. male who presents for Medicare Annual/Subsequent preventive examination.  Review of Systems     Cardiac Risk Factors include: diabetes mellitus;dyslipidemia;hypertension;male gender     Objective:    Today's Vitals   06/04/20 0856  Weight: 159 lb (72.1 kg)  Height: '5\' 6"'$  (1.676 m)   Body mass index is 25.66 kg/m.  Advanced Directives 06/04/2020 06/01/2019 01/16/2019 01/16/2019 03/22/2017 03/22/2017 09/04/2015  Does Patient Have a Medical Advance Directive? No No No No No No No  Would patient like information on creating a medical advance directive? - - No - Patient declined No - Patient declined No - Patient declined No - Patient declined -    Current Medications (verified) Outpatient Encounter Medications as of 06/04/2020  Medication Sig  . Ascorbic Acid (VITAMIN C) 100 MG tablet Take 500 mg by mouth daily.   [name] aspirin 81 MG chewable tablet Chew 81 mg by mouth daily.  [name] GLUCAGON EMERGENCY 1 MG injection INJECT 1  ML INTO MUSCLE AS DIRECTED FOR LOW BLOOD SUGAR (PER HYPOGLYCEMIA PROTOCOL)FOR UP TO 2 DOSES  . insulin aspart (NOVOLOG) 100 UNIT/ML injection 8 units with breakfast, 8 units with lunch and 7 units with dinner, correction scale up to 5 units with meals  . Multiple Vitamins-Minerals (MULTIVITAMIN WITH MINERALS) tablet Take 1 tablet by mouth daily.  . mycophenolate (MYFORTIC) 180 MG EC tablet Take 360 mg by mouth 2 (two) times daily.   . Potassium 99 MG TABS Take 99 mg by mouth.  . predniSONE (DELTASONE) 5 MG tablet Take 5 mg by mouth daily.  . tacrolimus (PROGRAF) 0.5 MG capsule Take 1.5 mg by mouth every 12 (twelve) hours.    No facility-administered encounter medications on file as of 06/04/2020.    Allergies (verified) Losartan and Oxycodone   History: Past Medical History:  Diagnosis Date  . Cataract    bilateral  . End stage renal disease ([place])    peritoneal dialysis since 01/2015  . Hypertension   . Type 1 diabetes [facility])    Past Surgical History:  Procedure Laterality Date  . CAPD INSERTION N/A 12/06/2014   Procedure: LAPAROSCOPIC INSERTION CONTINUOUS AMBULATORY PERITONEAL DIALYSIS  (CAPD) CATHETER;  Surgeon: Katha Cabal, MD;  Location: [facility] ORS;  Service: Vascular;  Laterality: N/A;  . CATARACT EXTRACTION W/ INTRAOCULAR LENS IMPLANT Bilateral 2015  . CYST REMOVAL TRUNK    . GYNECOMASTIA EXCISION  1993  . KIDNEY TRANSPLANT     Family History  Problem Relation Age of Onset  . Diabetes Mother   . Diabetes Paternal Grandfather    Social History  Socioeconomic History  . Marital status: Single    Spouse name: Not on file  . Number of children: Not on file  . Years of education: Not on file  . Highest education level: Not on file  Occupational History  . Occupation: disability   Tobacco Use  . Smoking status: Never Smoker  . Smokeless tobacco: Never Used  Vaping Use  . Vaping Use: Never used  Substance and Sexual Activity  . Alcohol use: No    Alcohol/week:  0.0 standard drinks  . Drug use: No  . Sexual activity: Never  Other Topics Concern  . Not on file  Social History Narrative  . Not on file   Social Determinants of Health   Financial Resource Strain: Low Risk   . Difficulty of Paying Living Expenses: Not hard at all  Food Insecurity: No Food Insecurity  . Worried About Charity fundraiser in the Last Year: Never true  . Ran Out of Food in the Last Year: Never true  Transportation Needs: No Transportation Needs  . Lack of Transportation (Medical): No  . Lack of Transportation (Non-Medical): No  Physical Activity: Sufficiently Active  . Days of Exercise per Week: 7 days  . Minutes of Exercise per Session: 40 min  Stress: No Stress Concern Present  . Feeling of Stress : Not at all  Social Connections: Socially Isolated  . Frequency of Communication with Friends and Family: More than three times a week  . Frequency of Social Gatherings with Friends and Family: More than three times a week  . Attends Religious Services: Never  . Active Member of Clubs or Organizations: No  . Attends Archivist Meetings: Never  . Marital Status: Divorced    Tobacco Counseling Counseling given: Not Answered   Clinical Intake:  Pre-visit preparation completed: Yes  Pain : No/denies pain     Nutritional Status: BMI 25 -29 Overweight Nutritional Risks: None Diabetes: Yes  How often do you need to have someone help you when you read instructions, pamphlets, or other written materials from your doctor or pharmacy?: 1 - Never What is the last grade level you completed in school?: 12th grade  Diabetic? Nutrition Risk Assessment:  Has the patient had any N/V/D within the last 2 months?  No  Does the patient have any non-healing wounds?  No  Has the patient had any unintentional weight loss or weight gain?  No   Diabetes:  Is the patient diabetic?  Yes  If diabetic, was a CBG obtained today?  No  Did the patient bring in their  glucometer from home?  No  How often do you monitor your CBG's? daily.   Financial Strains and Diabetes Management:  Are you having any financial strains with the device, your supplies or your medication? No .  Does the patient want to be seen by Chronic Care Management for management of their diabetes?  No  Would the patient like to be referred to a Nutritionist or for Diabetic Management?  No   Diabetic Exams:  Diabetic Eye Exam: Completed 03/20/2020 Diabetic Foot Exam: Overdue, Pt has been advised about the importance in completing this exam. Pt is scheduled for diabetic foot exam on next appointment.  Interpreter Needed?: No  Information entered by :: NAllen LPN   Activities of Daily Living In your present state of health, do you have any difficulty performing the following activities: 06/04/2020  Hearing? N  Vision? N  Difficulty concentrating or making decisions? N  Walking or climbing stairs? N  Dressing or bathing? N  Doing errands, shopping? N  Preparing Food and eating ? N  Using the Toilet? N  In the past six months, have you accidently leaked urine? N  Do you have problems with loss of bowel control? N  Managing your Medications? N  Managing your Finances? N  Housekeeping or managing your Housekeeping? N  Some recent data might be hidden    Patient Care Team: [name], NP as PCP - General (Nurse Practitioner) Sheran Fava, MD as Consulting Physician (Endocrinology) Vanita Ingles, RN as Registered Nurse (General Practice)  Indicate any recent Medical Services you may have received from other than [facility] providers in the past year (date may be approximate).     Assessment:   This is a routine wellness examination for Nimesh.  Hearing/Vision screen No exam data present  Dietary issues and exercise activities discussed: Current Exercise Habits: Home exercise routine, Type of exercise: strength training/weights;walking, Time (Minutes): 40,  Frequency (Times/Week): 7, Weekly Exercise (Minutes/Week): 280  Goals    .  Patient Stated      06/04/2020, no goals    .  RNCM- Pt- "I want to stay healthy" (pt-stated)      Current Barriers:  . Chronic Disease Management support and education needs related to Diabetes type 1  Nurse Case Manager Clinical Goal(s):  [name] Over the next 120 days, patient will work with [facility] to address needs related to Diabetes  Interventions:  . Evaluation of current treatment plan related to Diabetes  and patient's adherence to plan as established by provider. The patient continues to be seen at [facility] and [facility]  . Discussed plans with patient for ongoing care management follow up and provided patient with direct contact information for care management team . Advised patient, providing education and rationale, to check cbg QID and record, calling PCP or Endocrinologist for findings outside established parameters.  01-24-2020: The patient has the [facility] and checks his blood sugars often. This am it was 138.   Review of the patients hemoglobin A1C. Most recent A1C at [facility] was 7.6 on 11-01-2019.  [name] Pharmacy referral for patient trying to obtain [place] 2 sensors with alarm system for lows. Endocrinologist office also working on this. Reports continuing to have lows(53). 01-24-2020: The patient continues to have low episodes. States sometimes it is like a wave. The Freestyle Elenor Legato does record lows and alerts when his blood sugar drops. He states that his blood sugar was 138 this am and when he went to the gym and worked out for about 40 minutes it dropped to 56.  He ate something and it came back up. Education on blood sugar dropping with 15 minutes of moderate exercise and to have on hand sugary snacks.  Education and support given.  . Empathetic listening as patient recounted recent hypoglycemic episode where he ended up being admitted to the hospital. Patient stating he still feels "fuzzy headed" and reports  his memory is not right. Continues to work with [facility] to recover memory. 11-29-2019: The patient verbalized he is still having issues with his memory. This is frustrating to him. Talked about writing things down and using things like this to help remember.  . Review of preventative measures. The patient has eye exams regularly. Sees specialist regularly. Has not had office visit with pcp since December 2020 but has been in contact with the office.  Review of CCM team role and provided  the patient with the [facility] number for needs or questions.  . Evaluation of support system: Patient is back at his home and his son, daughter in law and family are living with him. He is thankful for the support of his family.  . Evaluation of SDOH: Patient denies needs for transportation, financial assistance or medication assistance at this time.   Patient Self Care Activities:  . Self administers medications as prescribed . Attends church or other social activities . Performs ADL's independently . Performs IADL's independently . Recent hypoglycemic episode resulting in hospitalization and short term memory loss  Please see past updates related to this goal by clicking on the "Past Updates" button in the selected goal      .  RNCM: pt:"I am doing the best I can" (pt-stated)      CARE PLAN ENTRY (see longtitudinal plan of care for additional care plan information)  Current Barriers:  . Chronic Disease Management support, education, and care coordination needs related to HTN, HLD, and history of kidney transplant  Clinical Goal(s) related to HTN, HLD, and history of kidney transplant  :  Over the next 120 days, patient will:  . Work with the care management team to address educational, disease management, and care coordination needs  . Begin or continue self health monitoring activities as directed today  adhere to a heart healthy/ADA diet and follow recommendations of pcp and specialist concerning  preventative care for chronic conditions. . Call provider office for new or worsened signs and symptoms Blood glucose findings outside established parameters, Blood pressure findings outside established parameters, and New or worsened symptom related to immunosuppression and other chronic conditions . Call care management team with questions or concerns . Verbalize basic understanding of patient centered plan of care established today  Interventions related to HTN, HLD, and history of kidney transplant :  . Evaluation of current treatment plans and patient's adherence to plan as established by provider.  01-24-2020 The patient states he is doing well and has had follow up appointments with the specialist. No issues related to kidney transplant. Does not take blood pressures at home but states at MD visits it has been good.  . Assessed patient understanding of disease states. 01-24-2020: Has a good understanding of chronic conditions and how to manage effectively.  . Assessed patient's education and care coordination needs.  Denies any needs at this time.  . Provided disease specific education to patient. 01-24-2020: Review of heart healthy/ADA diet. The patient verbalized compliance with heart healthy/ADA diet. Denies any issues with dietary restrictions or weight gain/loss.  Nash Dimmer with appropriate clinical care team members regarding patient needs. Has worked in the past with the pharmacist. Franco Nones about LCSW and care guides for support.   Patient Self Care Activities related to HTN, HLD, and history of kidney transplant :  . Patient is unable to independently self-manage chronic health conditions  Please see past updates related to this goal by clicking on the "Past Updates" button in the selected goal        Depression Screen PHQ 2/9 Scores 06/04/2020 06/01/2019 03/04/2019  PHQ - 2 Score 0 0 0    Fall Risk Fall Risk  06/04/2020 06/01/2019  Falls in the past year? 0 0  Number falls in  past yr: - 0  Injury with Fall? - 0  Risk for fall due to : Medication side effect -  Follow up Falls evaluation completed;Education provided;Falls prevention discussed -    FALL RISK PREVENTION  PERTAINING TO THE HOME:  Any stairs in or around the home? Yes  If so, are there any without handrails? No  Home free of loose throw rugs in walkways, pet beds, electrical cords, etc? Yes  Adequate lighting in your home to reduce risk of falls? No   ASSISTIVE DEVICES UTILIZED TO PREVENT FALLS:  Life alert? No  Use of a cane, walker or w/c? No  Grab bars in the bathroom? No  Shower chair or bench in shower? No  Elevated toilet seat or a handicapped toilet? No   TIMED UP AND GO:  Was the test performed? No .    Cognitive Function:     6CIT Screen 06/04/2020 06/01/2019  What Year? 0 points 0 points  What month? 0 points 0 points  What time? 0 points 0 points  Count back from 20 2 points 0 points  Months in reverse 4 points 0 points  Repeat phrase 10 points 0 points  Total Score 16 0    Immunizations Immunization History  Administered Date(s) Administered  . Influenza,inj,Quad PF,6+ Mos 04/15/2017, 05/18/2018  . Pneumococcal Conjugate-13 01/14/2018  . Pneumococcal Polysaccharide-23 12/05/2014, 06/17/2017  . Tdap 12/05/2014    TDAP status: Up to date  Flu Vaccine status: Due, Education has been provided regarding the importance of this vaccine. Advised may receive this vaccine at local pharmacy or Health Dept. Aware to provide a copy of the vaccination record if obtained from local pharmacy or Health Dept. Verbalized acceptance and understanding.  Pneumococcal vaccine status: Up to date  Covid-19 vaccine status: Declined, Education has been provided regarding the importance of this vaccine but patient still declined. Advised may receive this vaccine at local pharmacy or Health Dept.or vaccine clinic. Aware to provide a copy of the vaccination record if obtained from local  pharmacy or Health Dept. Verbalized acceptance and understanding.  Qualifies for Shingles Vaccine? No   Zostavax completed n/a  Shingrix Completed?: n/a  Screening Tests Health Maintenance  Topic Date Due  . Hepatitis C Screening  Never done  . FOOT EXAM  Never done  . URINE MICROALBUMIN  Never done  . COVID-19 Vaccine (1) Never done  . HEMOGLOBIN A1C  07/17/2019  . INFLUENZA VACCINE  12/11/2019  . COLONOSCOPY (Pts 45-64yr Insurance coverage will need to be confirmed)  Never done  . OPHTHALMOLOGY EXAM  03/20/2021  . TETANUS/TDAP  12/04/2024  . PNEUMOCOCCAL POLYSACCHARIDE VACCINE AGE 35-64 HIGH RISK  Completed  . HIV Screening  Completed    Health Maintenance  Health Maintenance Due  Topic Date Due  . Hepatitis C Screening  Never done  . FOOT EXAM  Never done  . URINE MICROALBUMIN  Never done  . COVID-19 Vaccine (1) Never done  . HEMOGLOBIN A1C  07/17/2019  . INFLUENZA VACCINE  12/11/2019  . COLONOSCOPY (Pts 45-469yrInsurance coverage will need to be confirmed)  Never done    Colorectal cancer screening: due  Lung Cancer Screening: (Low Dose CT Chest recommended if Age 412-80ears, 30 pack-year currently smoking OR have quit w/in 15years.) does not qualify.   Lung Cancer Screening Referral: no  Additional Screening:  Hepatitis C Screening: does qualify; due  Vision Screening: Recommended annual ophthalmology exams for early detection of glaucoma and other disorders of the eye. Is the patient up to date with their annual eye exam?  Yes  Who is the provider or what is the name of the office in which the patient attends annual eye exams[facility]  [facility] is not established with a provider, would they like to be referred to a provider to establish care? No .   Dental Screening: Recommended annual dental exams for proper oral hygiene  Community Resource Referral / Chronic Care Management: CRR required this visit?  No   CCM required this visit?  No       Plan:     I have personally reviewed and noted the following in the patient's chart:   . Medical and social history . Use of alcohol, tobacco or illicit drugs  . Current medications and supplements . Functional ability and status . Nutritional status . Physical activity . Advanced directives . List of other physicians . Hospitalizations, surgeries, and ER visits in previous 12 months . Vitals . Screenings to include cognitive, depression, and falls . Referrals and appointments  In addition, I have reviewed and discussed with patient certain preventive protocols, quality metrics, and best practice recommendations. A written personalized care plan for preventive services as well as general preventive health recommendations were provided to patient.     Kellie Simmering, LPN   [id number]   Nurse Notes:

## 2020-06-04 NOTE — Patient Instructions (Signed)
Gabriel Huff , Thank you for taking time to come for your Medicare Wellness Visit. I appreciate your ongoing commitment to your health goals. Please review the following plan we discussed and let me know if I can assist you in the future.   Screening recommendations/referrals: Colonoscopy: due Recommended yearly ophthalmology/optometry visit for glaucoma screening and checkup Recommended yearly dental visit for hygiene and checkup  Vaccinations: Influenza vaccine: due Pneumococcal vaccine: n/a Tdap vaccine: completed 12/05/2014, due 12/04/2024 Shingles vaccine: n/a   Covid-19: decline  Advanced directives: Advance directive discussed with you today.   Conditions/risks identified: 6 CIT 16  Next appointment: Follow up in one year for your annual wellness visit   Preventive Care 40-64 Years, Male Preventive care refers to lifestyle choices and visits with your health care provider that can promote health and wellness. What does preventive care include?  A yearly physical exam. This is also called an annual well check.  Dental exams once or twice a year.  Routine eye exams. Ask your health care provider how often you should have your eyes checked.  Personal lifestyle choices, including:  Daily care of your teeth and gums.  Regular physical activity.  Eating a healthy diet.  Avoiding tobacco and drug use.  Limiting alcohol use.  Practicing safe sex.  Taking low-dose aspirin every day starting at age 80. What happens during an annual well check? The services and screenings done by your health care provider during your annual well check will depend on your age, overall health, lifestyle risk factors, and family history of disease. Counseling  Your health care provider may ask you questions about your:  Alcohol use.  Tobacco use.  Drug use.  Emotional well-being.  Home and relationship well-being.  Sexual activity.  Eating habits.  Work and work  Statistician. Screening  You may have the following tests or measurements:  Height, weight, and BMI.  Blood pressure.  Lipid and cholesterol levels. These may be checked every 5 years, or more frequently if you are over 60 years old.  Skin check.  Lung cancer screening. You may have this screening every year starting at age 3 if you have a 30-pack-year history of smoking and currently smoke or have quit within the past 15 years.  Fecal occult blood test (FOBT) of the stool. You may have this test every year starting at age 45.  Flexible sigmoidoscopy or colonoscopy. You may have a sigmoidoscopy every 5 years or a colonoscopy every 10 years starting at age 81.  Prostate cancer screening. Recommendations will vary depending on your family history and other risks.  Hepatitis C blood test.  Hepatitis B blood test.  Sexually transmitted disease (STD) testing.  Diabetes screening. This is done by checking your blood sugar (glucose) after you have not eaten for a while (fasting). You may have this done every 1-3 years. Discuss your test results, treatment options, and if necessary, the need for more tests with your health care provider. Vaccines  Your health care provider may recommend certain vaccines, such as:  Influenza vaccine. This is recommended every year.  Tetanus, diphtheria, and acellular pertussis (Tdap, Td) vaccine. You may need a Td booster every 10 years.  Zoster vaccine. You may need this after age 60.  Pneumococcal 13-valent conjugate (PCV13) vaccine. You may need this if you have certain conditions and have not been vaccinated.  Pneumococcal polysaccharide (PPSV23) vaccine. You may need one or two doses if you smoke cigarettes or if you have certain conditions. Talk to  your health care provider about which screenings and vaccines you need and how often you need them. This information is not intended to replace advice given to you by your health care provider. Make  sure you discuss any questions you have with your health care provider. Document Released: 05/25/2015 Document Revised: 01/16/2016 Document Reviewed: 02/27/2015 Elsevier Interactive Patient Education  2017 [place] Prevention in the Home Falls can cause injuries. They can happen to people of all ages. There are many things you can do to make your home safe and to help prevent falls. What can I do on the outside of my home?  Regularly fix the edges of walkways and driveways and fix any cracks.  Remove anything that might make you trip as you walk through a door, such as a raised step or threshold.  Trim any bushes or trees on the path to your home.  Use bright outdoor lighting.  Clear any walking paths of anything that might make someone trip, such as rocks or tools.  Regularly check to see if handrails are loose or broken. Make sure that both sides of any steps have handrails.  Any raised decks and porches should have guardrails on the edges.  Have any leaves, snow, or ice cleared regularly.  Use sand or salt on walking paths during winter.  Clean up any spills in your garage right away. This includes oil or grease spills. What can I do in the bathroom?  Use night lights.  Install grab bars by the toilet and in the tub and shower. Do not use towel bars as grab bars.  Use non-skid mats or decals in the tub or shower.  If you need to sit down in the shower, use a plastic, non-slip stool.  Keep the floor dry. Clean up any water that spills on the floor as soon as it happens.  Remove soap buildup in the tub or shower regularly.  Attach bath mats securely with double-sided non-slip rug tape.  Do not have throw rugs and other things on the floor that can make you trip. What can I do in the bedroom?  Use night lights.  Make sure that you have a light by your bed that is easy to reach.  Do not use any sheets or blankets that are too big for your bed. They should  not hang down onto the floor.  Have a firm chair that has side arms. You can use this for support while you get dressed.  Do not have throw rugs and other things on the floor that can make you trip. What can I do in the kitchen?  Clean up any spills right away.  Avoid walking on wet floors.  Keep items that you use a lot in easy-to-reach places.  If you need to reach something above you, use a strong step stool that has a grab bar.  Keep electrical cords out of the way.  Do not use floor polish or wax that makes floors slippery. If you must use wax, use non-skid floor wax.  Do not have throw rugs and other things on the floor that can make you trip. What can I do with my stairs?  Do not leave any items on the stairs.  Make sure that there are handrails on both sides of the stairs and use them. Fix handrails that are broken or loose. Make sure that handrails are as long as the stairways.  Check any carpeting to make sure that it  is firmly attached to the stairs. Fix any carpet that is loose or worn.  Avoid having throw rugs at the top or bottom of the stairs. If you do have throw rugs, attach them to the floor with carpet tape.  Make sure that you have a light switch at the top of the stairs and the bottom of the stairs. If you do not have them, ask someone to add them for you. What else can I do to help prevent falls?  Wear shoes that:  Do not have high heels.  Have rubber bottoms.  Are comfortable and fit you well.  Are closed at the toe. Do not wear sandals.  If you use a stepladder:  Make sure that it is fully opened. Do not climb a closed stepladder.  Make sure that both sides of the stepladder are locked into place.  Ask someone to hold it for you, if possible.  Clearly mark and make sure that you can see:  Any grab bars or handrails.  First and last steps.  Where the edge of each step is.  Use tools that help you move around (mobility aids) if they are  needed. These include:  Canes.  Walkers.  Scooters.  Crutches.  Turn on the lights when you go into a dark area. Replace any light bulbs as soon as they burn out.  Set up your furniture so you have a clear path. Avoid moving your furniture around.  If any of your floors are uneven, fix them.  If there are any pets around you, be aware of where they are.  Review your medicines with your doctor. Some medicines can make you feel dizzy. This can increase your chance of falling. Ask your doctor what other things that you can do to help prevent falls. This information is not intended to replace advice given to you by your health care provider. Make sure you discuss any questions you have with your health care provider. Document Released: 02/22/2009 Document Revised: 10/04/2015 Document Reviewed: 06/02/2014 Elsevier Interactive Patient Education  2017 [employer].

## 2020-09-27 ENCOUNTER — Emergency Department
Admission: EM | Admit: 2020-09-27 | Discharge: 2020-09-27 | Discharge status: Against Medical Advice | Payer: Medicare Other

## 2020-09-27 NOTE — ED Notes (Signed)
Father to ED to take patient home.  Patient is AAOx3.  Skin warm and dry.  D/C 18g saline lock.  Site clean and dry.  Catheter intact.

## 2020-09-27 NOTE — ED Triage Notes (Signed)
First Nurse Note:  Arrives via [facility] for C/O hypoglycemia.  Patient found on side of road unresponsive.  Initial CBG:  40.  1 am D50 given.  Repeat CBG  280.  18 g saline lock placed by EMS

## 2021-04-02 ENCOUNTER — Encounter: Payer: Self-pay | Admitting: Nurse Practitioner

## 2021-04-03 ENCOUNTER — Ambulatory Visit (INDEPENDENT_AMBULATORY_CARE_PROVIDER_SITE_OTHER): Payer: Medicare Other | Admitting: Nurse Practitioner

## 2021-04-03 ENCOUNTER — Other Ambulatory Visit: Payer: Self-pay

## 2021-04-03 ENCOUNTER — Encounter: Payer: Self-pay | Admitting: Nurse Practitioner

## 2021-04-03 VITALS — BP 135/87 | HR 82 | Temp 97.7°F | Wt 158.8 lb

## 2021-04-03 DIAGNOSIS — D849 Immunodeficiency, unspecified: Secondary | ICD-10-CM

## 2021-04-03 DIAGNOSIS — E1069 Type 1 diabetes mellitus with other specified complication: Secondary | ICD-10-CM | POA: Diagnosis not present

## 2021-04-03 DIAGNOSIS — E785 Hyperlipidemia, unspecified: Secondary | ICD-10-CM

## 2021-04-03 DIAGNOSIS — L03115 Cellulitis of right lower limb: Secondary | ICD-10-CM

## 2021-04-03 DIAGNOSIS — E1159 Type 2 diabetes mellitus with other circulatory complications: Secondary | ICD-10-CM

## 2021-04-03 DIAGNOSIS — L0390 Cellulitis, unspecified: Secondary | ICD-10-CM | POA: Insufficient documentation

## 2021-04-03 DIAGNOSIS — I152 Hypertension secondary to endocrine disorders: Secondary | ICD-10-CM

## 2021-04-03 DIAGNOSIS — E1065 Type 1 diabetes mellitus with hyperglycemia: Secondary | ICD-10-CM

## 2021-04-03 MED ORDER — CEPHALEXIN 500 MG PO CAPS: 500.0000 mg | ORAL_CAPSULE | Freq: Two times a day (BID) | ORAL | 0 refills | Status: AC

## 2021-04-03 MED ORDER — GENTAMICIN SULFATE 0.1 % EX OINT: 1.0000 "application " | TOPICAL_OINTMENT | Freq: Two times a day (BID) | CUTANEOUS | 0 refills | Status: AC

## 2021-04-03 NOTE — Assessment & Plan Note (Signed)
Chronic, ongoing with insulin pump in place, recent A1c much improved at 6.5%. Continue collaboration with Dr. Honor Junes with endocrinology and CCM team to assist in ensuring care between providers coordinated and assistance with medication regimen.  Return in 3 months.

## 2021-04-03 NOTE — Assessment & Plan Note (Signed)
Followed by [facility] transplant team, continue this collaboration and will involve CCM team for any needs.

## 2021-04-03 NOTE — Assessment & Plan Note (Signed)
Chronic, stable without medication.  Recommend he monitor BP at least a few mornings a week at home and document.  DASH diet at home.  Continue current medication regimen and adjust as needed.  Labs today.  Return in 6 months.

## 2021-04-03 NOTE — Assessment & Plan Note (Signed)
In diabetic, will extend Keflex treatment x 7 more days and add on Gentamicin ointment to apply to wound.  Recommend patient monitor closely, if any worsening redness or swelling immediately be seen.  Return in one week.

## 2021-04-03 NOTE — Progress Notes (Signed)
BP 135/87   Pulse 82   Temp 97.7 F (36.5 C) (Oral)   Wt 158 lb 12.8 oz (72 kg)   BMI 25.63 kg/m    Subjective:    Patient ID: Gabriel Mannan Sr., male    DOB: 12-26-1975, 45 y.o.   MRN: [id number]  HPI: Gabriel Huff. is a 45 y.o. male  Chief Complaint  Patient presents with   Laceration    Patient is here for a cut on right shin area. Patient states about 2 weeks ago he noticed he sugar levels had dropped and he was trying to get to something with sugar in it. Patient states he thinks he may have tripped over something and noticed his face was bloody and he does not remember how he had the cut on his right shin area. Patient states he would like to have it checked out by his provider as he his a Diabetic. Patient states he went to a walk-in clinic and was prescribed something, but patient   SKIN INFECTION Laceration  to right shin 2 weeks ago, after trying to get up to get some sugar.  He tripped and fell, but does not recall how he tore up shin.  He went to walk in clinic and they provided abx, but redness continued. Duration: weeks Location: right shin History of trauma in area: yes Pain: no Redness: yes -- he reports it is fading Swelling: no Oozing: no Pus: no Fevers: no Nausea/vomiting: no Status: stable Treatments attempted:antibiotics  Tetanus: UTD  last in 2016  DIABETES Currently has insulin pump in place and is followed by Dr. Honor Junes and last seen 03/20/21 with A1c 6.5%.  Insulin pump Medtronic 630 G, Novolog can go to 40 - 50 units/daily.  Has Libre in place.     Has history of kidney transplant 03/18/2016 and is currently listed pancreas transplant, they tried this in past and did not take.  Is followed by [facility] transplant team and they are working on setting up for another pancreas transplant, last visit 07/20/20. Hypoglycemic episodes:no Polydipsia/polyuria: no Visual disturbance: no Chest pain: no Paresthesias: no Glucose Monitoring: yes              Accucheck frequency: TID             Fasting glucose:             Post prandial:             Evening:             Before meals: Taking Insulin?: yes             Long acting insulin:             Short acting insulin: Blood Pressure Monitoring: daily Retinal Examination: Up to Date Foot Exam: Up to Date Pneumovax: Up to Date Influenza: Up to Date Aspirin: no    HYPERTENSION / HYPERLIPIDEMIA Not currently taking BP medication, but is on Lipitor for cholesterol. Satisfied with current treatment? yes Duration of hypertension: chronic BP monitoring frequency: not checking BP range:  BP medication side effects: no Duration of hyperlipidemia: chronic Cholesterol medication side effects: no Cholesterol supplements: none Medication compliance: good compliance Aspirin: no Recent stressors: no Recurrent headaches: no Visual changes: no Palpitations: no Dyspnea: no Chest pain: no Lower extremity edema: no Dizzy/lightheaded: no   Relevant past medical, surgical, family and social history reviewed and updated as indicated. Interim medical history since our last visit reviewed. Allergies and  medications reviewed and updated.  Review of Systems  Constitutional:  Negative for activity change, diaphoresis, fatigue and fever.  Respiratory:  Negative for cough, chest tightness, shortness of breath and wheezing.   Cardiovascular:  Negative for chest pain, palpitations and leg swelling.  Gastrointestinal: Negative.   Endocrine: Negative for cold intolerance, heat intolerance, polydipsia, polyphagia and polyuria.  Skin:  Positive for wound.  Neurological: Negative.   Psychiatric/Behavioral: Negative.     Per HPI unless specifically indicated above     Objective:    BP 135/87   Pulse 82   Temp 97.7 F (36.5 C) (Oral)   Wt 158 lb 12.8 oz (72 kg)   BMI 25.63 kg/m   Wt Readings from Last 3 Encounters:  04/03/21 158 lb 12.8 oz (72 kg)  06/04/20 159 lb (72.1 kg)  06/01/19 151 lb  12.8 oz (68.9 kg)    Physical Exam Vitals and nursing note reviewed.  Constitutional:      General: He is awake. He is not in acute distress.    Appearance: He is well-developed and well-groomed. He is not ill-appearing or toxic-appearing.  HENT:     Head: Normocephalic and atraumatic.     Right Ear: Hearing normal. No drainage.     Left Ear: Hearing normal. No drainage.  Eyes:     General: Lids are normal.        Right eye: No discharge.        Left eye: No discharge.     Conjunctiva/sclera: Conjunctivae normal.     Pupils: Pupils are equal, round, and reactive to light.  Neck:     Thyroid: No thyromegaly.     Vascular: No carotid bruit.  Cardiovascular:     Rate and Rhythm: Normal rate and regular rhythm.     Heart sounds: Normal heart sounds, S1 normal and S2 normal. No murmur heard.   No gallop.  Pulmonary:     Effort: Pulmonary effort is normal. No accessory muscle usage or respiratory distress.     Breath sounds: Normal breath sounds.  Abdominal:     General: Bowel sounds are normal.     Palpations: Abdomen is soft.  Musculoskeletal:        General: Normal range of motion.     Cervical back: Normal range of motion and neck supple.     Right lower leg: No edema.     Left lower leg: No edema.  Skin:    General: Skin is warm and dry.     Findings: Wound present.       Neurological:     Mental Status: He is alert and oriented to person, place, and time.     Deep Tendon Reflexes: Reflexes are normal and symmetric.  Psychiatric:        Attention and Perception: Attention normal.        Mood and Affect: Mood normal.        Speech: Speech normal.        Behavior: Behavior normal. Behavior is cooperative.        Thought Content: Thought content normal.        Judgment: Judgment normal.   Diabetic Foot Exam - Simple   Simple Foot Form Visual Inspection No deformities, no ulcerations, no other skin breakdown bilaterally: Yes Sensation Testing Intact to touch and  monofilament testing bilaterally: Yes Pulse Check Posterior Tibialis and Dorsalis pulse intact bilaterally: Yes Comments    Results for orders placed or performed in visit on 03/22/20  [facility] DIABETES EYE EXAM  Result Value Ref Range   HM Diabetic Eye Exam Retinopathy (A) No Retinopathy      Assessment & Plan:   Problem List Items Addressed This Visit       Cardiovascular and Mediastinum   Hypertension associated with diabetes ([place])    Chronic, stable without medication.  Recommend he monitor BP at least a few mornings a week at home and document.  DASH diet at home.  Continue current medication regimen and adjust as needed.  Labs today.  Return in 6 months.       Relevant Medications   atorvastatin (LIPITOR) 20 MG tablet     Endocrine   Hyperlipidemia due to type 1 diabetes mellitus (HCC)    Chronic, ongoing.  Continue statin daily and adjust dose as needed.  Lipid and CMP up to date outpatient with specialist on review.      Relevant Medications   atorvastatin (LIPITOR) 20 MG tablet   Uncontrolled type 1 diabetes mellitus with hyperglycemia (HCC) - Primary    Chronic, ongoing with insulin pump in place, recent A1c much improved at 6.5%. Continue collaboration with Dr. Honor Junes with endocrinology and CCM team to assist in ensuring care between providers coordinated and assistance with medication regimen.  Return in 3 months.      Relevant Medications   atorvastatin (LIPITOR) 20 MG tablet     Other   Cellulitis    In diabetic, will extend Keflex treatment x 7 more days and add on Gentamicin ointment to apply to wound.  Recommend patient monitor closely, if any worsening redness or swelling immediately be seen.  Return in one week.      Immunosuppression ([place])    Followed by [facility] transplant team, continue this collaboration and will involve CCM team for any needs.        Follow up plan: Return in about 1 week (around 04/10/2021) for Wound check.

## 2021-04-03 NOTE — Assessment & Plan Note (Signed)
Chronic, ongoing.  Continue statin daily and adjust dose as needed.  Lipid and CMP up to date outpatient with specialist on review.

## 2021-04-03 NOTE — Patient Instructions (Signed)
Wound Care, Adult ?Taking care of your wound properly can help to prevent pain, infection, and scarring. It can also help your wound heal more quickly. Follow instructions from your health care provider about how to care for your wound. ?Supplies needed: ?Soap and water. ?Wound cleanser, saline, or germ-free (sterile) water. ?Gauze. ?If needed, a clean bandage (dressing) or other type of wound dressing material to cover or place in the wound. Follow your health care provider's instructions about what dressing supplies to use. ?Cream or topical ointment to apply to the wound, if told by your health care provider. ?How to care for your wound ?Cleaning the wound ?Ask your health care provider how to clean the wound. This may include: ?Using mild soap and water, a wound cleanser, saline, or sterile water. ?Using a clean gauze to pat the wound dry after cleaning it. Do not rub or scrub the wound. ?Dressing care ?Wash your hands with soap and water for at least 20 seconds before and after you change the dressing. If soap and water are not available, use hand sanitizer. ?Change your dressing as told by your health care provider. This may include: ?Cleaning or rinsing out (irrigating) the wound. ?Application of cream or topical ointment, if told by your health care provider. ?Placing a dressing over the wound or in the wound (packing). ?Covering the wound with an outer dressing. ?Leave stitches (sutures), staples, skin glue, or adhesive strips in place. These skin closures may need to stay in place for 2 weeks or longer. If adhesive strip edges start to loosen and curl up, you may trim the loose edges. Do not remove adhesive strips completely unless your health care provider tells you to do that. ?Ask your health care provider when you can leave the wound uncovered. ?Checking for infection ?Check your wound area every day for signs of infection. Check for: ?More redness, swelling, or pain. ?Fluid or blood. ?Warmth. ?Pus or  a bad smell. ? ?Follow these instructions at home ?Medicines ?If you were prescribed an antibiotic medicine, cream, or ointment, take or apply it as told by your health care provider. Do not stop using the antibiotic even if your condition improves. ?If you were prescribed pain medicine, take it 30 minutes before you do any wound care or as told by your health care provider. ?Take over-the-counter and prescription medicines only as told by your health care provider. ?Eating and drinking ?Eat a diet that includes protein, vitamin A, vitamin C, and other nutrient-rich foods to help the wound heal. ?Foods rich in protein include meat, fish, eggs, dairy, beans, and nuts. ?Foods rich in vitamin A include carrots and dark green, leafy vegetables. ?Foods rich in vitamin C include citrus fruits, tomatoes, broccoli, and peppers. ?Drink enough fluid to keep your urine pale yellow. ?General instructions ?Do not take baths, swim, or use a hot tub until your health care provider approves. Ask your health care provider if you may take showers. You may only be allowed to take sponge baths. ?Do not scratch or pick at the wound. Keep it covered as told by your health care provider. ?Return to your normal activities as told by your health care provider. Ask your health care provider what activities are safe for you. ?Protect your wound from the sun when you are outside for the first 6 months, or for as long as told by your health care provider. Cover up the scar area or apply sunscreen that has an SPF of at least 30. ?Do not   use any products that contain nicotine or tobacco. These products include cigarettes, chewing tobacco, and vaping devices, such as e-cigarettes. If you need help quitting, ask your health care provider. ?Keep all follow-up visits. This is important. ?Contact a health care provider if: ?You received a tetanus shot and you have swelling, severe pain, redness, or bleeding at the injection site. ?Your pain is not  controlled with medicine. ?You have any of these signs of infection: ?More redness, swelling, or pain around the wound. ?Fluid or blood coming from the wound. ?Warmth coming from the wound. ?A fever or chills. ?You are nauseous or you vomit. ?You are dizzy. ?You have a new rash or hardness around the wound. ?Get help right away if: ?You have a red streak of skin near the area around your wound. ?Pus or a bad smell coming from the wound. ?Your wound has been closed with staples, sutures, skin glue, or adhesive strips and it begins to open up and separate. ?Your wound is bleeding, and the bleeding does not stop with gentle pressure. ?These symptoms may represent a serious problem that is an emergency. Do not wait to see if the symptoms will go away. Get medical help right away. Call your local emergency services (911 in the [place] Do not drive yourself to the hospital. ?Summary ?Always wash your hands with soap and water for at least 20 seconds before and after changing your dressing. ?Change your dressing as told by your health care provider. ?To help with healing, eat foods that are rich in protein, vitamin A, vitamin C, and other nutrients. ?Check your wound every day for signs of infection. Contact your health care provider if you think that your wound is infected. ?This information is not intended to replace advice given to you by your health care provider. Make sure you discuss any questions you have with your health care provider. ?Document Revised: 09/04/2020 Document Reviewed: 09/04/2020 ?Elsevier Patient Education ? 2022 [employer] ? ?

## 2021-04-10 ENCOUNTER — Ambulatory Visit: Payer: Medicare Other | Admitting: Nurse Practitioner

## 2021-05-31 ENCOUNTER — Emergency Department
Admission: EM | Admit: 2021-05-31 | Discharge: 2021-05-31 | Disposition: A | Discharge status: Home / Self Care | Payer: Medicare Other | Attending: Emergency Medicine | Admitting: Emergency Medicine

## 2021-05-31 ENCOUNTER — Emergency Department: Payer: Medicare Other

## 2021-05-31 ENCOUNTER — Other Ambulatory Visit: Payer: Self-pay

## 2021-05-31 ENCOUNTER — Encounter: Payer: Self-pay | Admitting: Emergency Medicine

## 2021-05-31 DIAGNOSIS — S81812A Laceration without foreign body, left lower leg, initial encounter: Secondary | ICD-10-CM | POA: Insufficient documentation

## 2021-05-31 DIAGNOSIS — Z23 Encounter for immunization: Secondary | ICD-10-CM | POA: Insufficient documentation

## 2021-05-31 DIAGNOSIS — Z203 Contact with and (suspected) exposure to rabies: Secondary | ICD-10-CM | POA: Diagnosis not present

## 2021-05-31 DIAGNOSIS — W540XXA Bitten by dog, initial encounter: Secondary | ICD-10-CM

## 2021-05-31 DIAGNOSIS — E119 Type 2 diabetes mellitus without complications: Secondary | ICD-10-CM | POA: Diagnosis not present

## 2021-05-31 DIAGNOSIS — Y9301 Activity, walking, marching and hiking: Secondary | ICD-10-CM | POA: Insufficient documentation

## 2021-05-31 DIAGNOSIS — E162 Hypoglycemia, unspecified: Secondary | ICD-10-CM | POA: Insufficient documentation

## 2021-05-31 DIAGNOSIS — Y92410 Unspecified street and highway as the place of occurrence of the external cause: Secondary | ICD-10-CM | POA: Insufficient documentation

## 2021-05-31 DIAGNOSIS — S8992XA Unspecified injury of left lower leg, initial encounter: Secondary | ICD-10-CM | POA: Diagnosis present

## 2021-05-31 LAB — BASIC METABOLIC PANEL
Anion gap: 6 (ref 5–15)
BUN: 15 mg/dL (ref 6–20)
CO2: 20 mmol/L — ABNORMAL LOW (ref 22–32)
Calcium: 9.1 mg/dL (ref 8.9–10.3)
Chloride: 109 mmol/L (ref 98–111)
Creatinine, Ser: 1.26 mg/dL — ABNORMAL HIGH (ref 0.61–1.24)
GFR, Estimated: >60 mL/min (ref >60)
Glucose, Bld: 202 mg/dL — ABNORMAL HIGH (ref 70–99)
Potassium: 4.5 mmol/L (ref 3.5–5.1)
Sodium: 135 mmol/L (ref 135–145)

## 2021-05-31 LAB — CBC
HCT: 41.7 % (ref 39.0–52.0)
Hemoglobin: 13.9 g/dL (ref 13.0–17.0)
MCH: 30.2 pg (ref 26.0–34.0)
MCHC: 33.3 g/dL (ref 30.0–36.0)
MCV: 90.5 fL (ref 80.0–100.0)
Platelets: 177 10*3/uL (ref 150–400)
RBC: 4.61 MIL/uL (ref 4.22–5.81)
RDW: 12.8 % (ref 11.5–15.5)
WBC: 14.8 10*3/uL — ABNORMAL HIGH (ref 4.0–10.5)
nRBC: 0 % (ref 0.0–0.2)

## 2021-05-31 LAB — CBG MONITORING, ED
Glucose-Capillary: 171 mg/dL — ABNORMAL HIGH (ref 70–99)
Glucose-Capillary: 109 mg/dL — ABNORMAL HIGH (ref 70–99)
Glucose-Capillary: 139 mg/dL — ABNORMAL HIGH (ref 70–99)
Glucose-Capillary: 166 mg/dL — ABNORMAL HIGH (ref 70–99)

## 2021-05-31 MED ORDER — LIDOCAINE-EPINEPHRINE 2 %-1:100000 IJ SOLN
20.0000 mL | Freq: Once | INTRAMUSCULAR | Status: AC
Start: 2021-05-31 — End: 2021-05-31
  Administered 2021-05-31: 20 mL

## 2021-05-31 MED ORDER — TETANUS-DIPHTH-ACELL PERTUSSIS 5-2.5-18.5 LF-MCG/0.5 IM SUSY
0.5000 mL | PREFILLED_SYRINGE | Freq: Once | INTRAMUSCULAR | Status: AC
  Administered 2021-05-31: 0.5 mL via INTRAMUSCULAR
  Filled 2021-05-31: qty 0.5

## 2021-05-31 MED ORDER — LIDOCAINE-EPINEPHRINE (PF) 2 %-1:200000 IJ SOLN
INTRAMUSCULAR | Status: AC
  Filled 2021-05-31: qty 20

## 2021-05-31 MED ORDER — AMOXICILLIN-POT CLAVULANATE 875-125 MG PO TABS
1.0000 | ORAL_TABLET | Freq: Once | ORAL | Status: AC
  Administered 2021-05-31: 1 via ORAL
  Filled 2021-05-31: qty 1

## 2021-05-31 MED ORDER — AMOXICILLIN-POT CLAVULANATE 875-125 MG PO TABS
1.0000 | ORAL_TABLET | Freq: Two times a day (BID) | ORAL | 0 refills | Status: AC
Start: 2021-05-31 — End: 2021-06-10

## 2021-05-31 MED ORDER — DOXYCYCLINE MONOHYDRATE 100 MG PO TABS: 100.0000 mg | ORAL_TABLET | Freq: Two times a day (BID) | ORAL | 0 refills | Status: AC

## 2021-05-31 NOTE — Discharge Instructions (Addendum)
Report incident to animal control. If dog cannot be quarantined, please return to the emergency department for rabies series. Please take Augmentin twice daily for the next 10 days and doxycycline twice daily for the next 7 days. Return to the emergency department if there is any redness or streaking surrounding the wound site.

## 2021-05-31 NOTE — ED Triage Notes (Signed)
Pt in via [facility], patient recalls walking home today, feeling as if his sugar was dropping.  Patient was found in a ditch by bystanders close to his house.  Patient also recalls a dog charging him, dog bite noted to left posterior calf, bleeding controlled at this time, dry dressing placed.  Per EMS, initial CBG 46, CBG 242 after IV therapy.  20G PIV L Hand 35mL D10 given PTA  Patient alert, able to recall most of incident but does state that there was a period where he was "in/out." Vitals WDL, NAD noted at this time.

## 2021-05-31 NOTE — ED Notes (Signed)
Patient is Type 1 Diabetic w/ insulin pump.  Pump remains on at this time.

## 2021-05-31 NOTE — ED Provider Notes (Signed)
[facility] Provider Note  Patient Contact: 8:38 PM (approximate)   History   Hypoglycemia and Animal Bite (/)   HPI  Gabriel Settlemire Deguia Sr. is a 46 y.o. male with a history of diabetes presents to the emergency department with a dog bite wound along the posterior aspect of the left calf.  Patient states that he became hypoglycemic and stumbled into a ditch.  Denies hitting his head or his neck.  Patient states that he was bitten by a dog on his way back from a flea market.  He states that animals are available to be quarantined for signs and symptoms of rabies.  He does not wish to initiate rabies vaccine series at this time.      Physical Exam   Triage Vital Signs: ED Triage Vitals  Enc Vitals Group     BP 05/31/21 1443 (!) 155/99     Pulse Rate 05/31/21 1443 94     Resp 05/31/21 1443 16     Temp 05/31/21 1443 97.8 F (36.6 C)     Temp Source 05/31/21 1443 Oral     SpO2 05/31/21 1443 96 %     Weight 05/31/21 1444 150 lb (68 kg)     Height 05/31/21 1444 5\' 9"  (1.753 m)     Head Circumference --      Peak Flow --      Pain Score 05/31/21 1444 0     Pain Loc --      Pain Edu? --      Excl. in [place]? --     Most recent vital signs: Vitals:   05/31/21 1443  BP: (!) 155/99  Pulse: 94  Resp: 16  Temp: 97.8 F (36.6 C)  SpO2: 96%     General: Alert and in no acute distress. Eyes:  PERRL. EOMI. Head: No acute traumatic findings ENT:      Ears:       Nose: No congestion/rhinnorhea.      Mouth/Throat: Mucous membranes are moist. Neck: No stridor. No cervical spine tenderness to palpation. Cardiovascular:  Good peripheral perfusion Respiratory: Normal respiratory effort without tachypnea or retractions. Lungs CTAB. Good air entry to the bases with no decreased or absent breath sounds. Gastrointestinal: Bowel sounds 4 quadrants. Soft and nontender to palpation. No guarding or rigidity. No palpable masses. No distention. No CVA  tenderness. Musculoskeletal: Full range of motion to all extremities.  Neurologic:  No gross focal neurologic deficits are appreciated.  Skin: Patient has a 5 cm linear laceration along the posterior aspect of left leg deep to underlying adipose tissue.  Palpable dorsalis pedis pulse bilaterally and symmetrically.  Capillary refill less than 2 seconds on the left.    ED Results / Procedures / Treatments   Labs (all labs ordered are listed, but only abnormal results are displayed) Labs Reviewed  CBC - Abnormal; Notable for the following components:      Result Value   WBC 14.8 (*)    All other components within normal limits  BASIC METABOLIC PANEL - Abnormal; Notable for the following components:   CO2 20 (*)    Glucose, Bld 202 (*)    Creatinine, Ser 1.26 (*)    All other components within normal limits  CBG MONITORING, ED - Abnormal; Notable for the following components:   Glucose-Capillary 171 (*)    All other components within normal limits  CBG MONITORING, ED - Abnormal; Notable for the following components:   Glucose-Capillary 109 (*)  All other components within normal limits  CBG MONITORING, ED - Abnormal; Notable for the following components:   Glucose-Capillary 139 (*)    All other components within normal limits  CBG MONITORING, ED - Abnormal; Notable for the following components:   Glucose-Capillary 166 (*)    All other components within normal limits        RADIOLOGY  I personally viewed and evaluated these images as part of my medical decision making, as well as reviewing the written report by the radiologist.  ED Provider Interpretation: I personally reviewed x-ray of left tibia/fibula and it was no retained foreign bodies or acute fractures.    [name].Laceration Repair  Date/Time: 05/31/2021 8:40 PM Performed by: Lannie Fields, PA-C Authorized by: Lannie Fields, PA-C   Consent:    Consent obtained:  Verbal   Risks discussed:  Infection and  pain Universal protocol:    Procedure explained and questions answered to patient or proxy's satisfaction: yes     Patient identity confirmed:  Verbally with patient Anesthesia:    Anesthesia method:  None Laceration details:    Location:  Leg   Length (cm):  5   Depth (mm):  5 Pre-procedure details:    Preparation:  Patient was prepped and draped in usual sterile fashion Exploration:    Limited defect created (wound extended): yes     Contaminated: yes   Treatment:    Area cleansed with:  Povidone-iodine   Amount of cleaning:  Standard   Irrigation solution:  Sterile saline   Irrigation volume:  500   Debridement:  None   Layers/structures repaired:  Deep subcutaneous Deep subcutaneous:    Suture size:  4-0   Suture material:  Monocryl   Subcutaneous suture technique: Subcutaneous interrupted. Skin repair:    Repair method:  Steri-Strips Approximation:    Approximation:  Close Repair type:    Repair type:  Simple Post-procedure details:    Dressing:  Non-adherent dressing   MEDICATIONS ORDERED IN ED: Medications  lidocaine-EPINEPHrine (XYLOCAINE W/EPI) 2 %-1:100000 (with pres) injection 20 mL (20 mLs Infiltration Not Given 05/31/21 2023)  lidocaine-EPINEPHrine (XYLOCAINE W/EPI) 2 %-1:200000 (PF) injection (  Given by Other 05/31/21 2023)     IMPRESSION / MDM / [place] / ED COURSE  I reviewed the triage vital signs and the nursing notes.                              Differential diagnosis includes, but is not limited to, dog bite wound  Assessment and plan: Dog bite wound:  46 year old male presents to the emergency department after he was bitten by a dog on his way back from a flea market.  Patient underwent laceration repair in the emergency department.  Subcutaneous simple interrupted sutures were placed followed by Steri-Strips  Patient was given his first dose of Augmentin in the emergency department.  I personally reviewed x-rays of the left  tibia/fibula and there were no retained foreign bodies or acute fractures.  Patient was discharged with Augmentin and doxycycline.  Cautioned patient that there is a very high risk of infection given his history of diabetes with current dog bite wound without compliance antibiotic.  He voiced understanding.  His tetanus status was updated in the emergency department.  Instructions for reporting incident to animal control were communicated..  Patient was advised that animal cannot be quarantined to return the emergency department to initiate rabies vaccine series.  Return precautions were given to return with redness or streaking surrounding the wound site.  All patient questions were answered.     FINAL CLINICAL IMPRESSION(S) / ED DIAGNOSES   Final diagnoses:  None     Rx / DC Orders   ED Discharge Orders     None        Note:  This document was prepared using Dragon voice recognition software and may include unintentional dictation errors.   Vallarie Mare [place], Hershal Coria 05/31/21 2127    Harvest Dark, MD 06/06/21 1318

## 2021-06-06 ENCOUNTER — Ambulatory Visit (INDEPENDENT_AMBULATORY_CARE_PROVIDER_SITE_OTHER): Payer: Medicare Other | Admitting: *Deleted

## 2021-06-06 DIAGNOSIS — Z0000 Encounter for general adult medical examination without abnormal findings: Secondary | ICD-10-CM | POA: Diagnosis not present

## 2021-06-06 NOTE — Patient Instructions (Signed)
Gabriel Huff , Thank you for taking time to come for your Medicare Wellness Visit. I appreciate your ongoing commitment to your health goals. Please review the following plan we discussed and let me know if I can assist you in the future.   Screening recommendations/referrals: Colonoscopy: Education provided Recommended yearly ophthalmology/optometry visit for glaucoma screening and checkup Recommended yearly dental visit for hygiene and checkup  Vaccinations: Influenza vaccine: Education provided Pneumococcal vaccine: up to date Tdap vaccine: up to date     Advanced directives: Education provided  Conditions/risks identified:     Preventive Care 40-64 Years, Male Preventive care refers to lifestyle choices and visits with your health care provider that can promote health and wellness. What does preventive care include? A yearly physical exam. This is also called an annual well check. Dental exams once or twice a year. Routine eye exams. Ask your health care provider how often you should have your eyes checked. Personal lifestyle choices, including: Daily care of your teeth and gums. Regular physical activity. Eating a healthy diet. Avoiding tobacco and drug use. Limiting alcohol use. Practicing safe sex. Taking low-dose aspirin every day starting at age 1. What happens during an annual well check? The services and screenings done by your health care provider during your annual well check will depend on your age, overall health, lifestyle risk factors, and family history of disease. Counseling  Your health care provider may ask you questions about your: Alcohol use. Tobacco use. Drug use. Emotional well-being. Home and relationship well-being. Sexual activity. Eating habits. Work and work Statistician. Screening  You may have the following tests or measurements: Height, weight, and BMI. Blood pressure. Lipid and cholesterol levels. These may be checked every 5 years, or  more frequently if you are over 47 years old. Skin check. Lung cancer screening. You may have this screening every year starting at age 21 if you have a 30-pack-year history of smoking and currently smoke or have quit within the past 15 years. Fecal occult blood test (FOBT) of the stool. You may have this test every year starting at age 42. Flexible sigmoidoscopy or colonoscopy. You may have a sigmoidoscopy every 5 years or a colonoscopy every 10 years starting at age 43. Prostate cancer screening. Recommendations will vary depending on your family history and other risks. Hepatitis C blood test. Hepatitis B blood test. Sexually transmitted disease (STD) testing. Diabetes screening. This is done by checking your blood sugar (glucose) after you have not eaten for a while (fasting). You may have this done every 1-3 years. Discuss your test results, treatment options, and if necessary, the need for more tests with your health care provider. Vaccines  Your health care provider may recommend certain vaccines, such as: Influenza vaccine. This is recommended every year. Tetanus, diphtheria, and acellular pertussis (Tdap, Td) vaccine. You may need a Td booster every 10 years. Zoster vaccine. You may need this after age 62. Pneumococcal 13-valent conjugate (PCV13) vaccine. You may need this if you have certain conditions and have not been vaccinated. Pneumococcal polysaccharide (PPSV23) vaccine. You may need one or two doses if you smoke cigarettes or if you have certain conditions. Talk to your health care provider about which screenings and vaccines you need and how often you need them. This information is not intended to replace advice given to you by your health care provider. Make sure you discuss any questions you have with your health care provider. Document Released: 05/25/2015 Document Revised: 01/16/2016 Document Reviewed: 02/27/2015 Elsevier  Interactive Patient Education  2017 [place] Prevention in the Home Falls can cause injuries. They can happen to people of all ages. There are many things you can do to make your home safe and to help prevent falls. What can I do on the outside of my home? Regularly fix the edges of walkways and driveways and fix any cracks. Remove anything that might make you trip as you walk through a door, such as a raised step or threshold. Trim any bushes or trees on the path to your home. Use bright outdoor lighting. Clear any walking paths of anything that might make someone trip, such as rocks or tools. Regularly check to see if handrails are loose or broken. Make sure that both sides of any steps have handrails. Any raised decks and porches should have guardrails on the edges. Have any leaves, snow, or ice cleared regularly. Use sand or salt on walking paths during winter. Clean up any spills in your garage right away. This includes oil or grease spills. What can I do in the bathroom? Use night lights. Install grab bars by the toilet and in the tub and shower. Do not use towel bars as grab bars. Use non-skid mats or decals in the tub or shower. If you need to sit down in the shower, use a plastic, non-slip stool. Keep the floor dry. Clean up any water that spills on the floor as soon as it happens. Remove soap buildup in the tub or shower regularly. Attach bath mats securely with double-sided non-slip rug tape. Do not have throw rugs and other things on the floor that can make you trip. What can I do in the bedroom? Use night lights. Make sure that you have a light by your bed that is easy to reach. Do not use any sheets or blankets that are too big for your bed. They should not hang down onto the floor. Have a firm chair that has side arms. You can use this for support while you get dressed. Do not have throw rugs and other things on the floor that can make you trip. What can I do in the kitchen? Clean up any spills right  away. Avoid walking on wet floors. Keep items that you use a lot in easy-to-reach places. If you need to reach something above you, use a strong step stool that has a grab bar. Keep electrical cords out of the way. Do not use floor polish or wax that makes floors slippery. If you must use wax, use non-skid floor wax. Do not have throw rugs and other things on the floor that can make you trip. What can I do with my stairs? Do not leave any items on the stairs. Make sure that there are handrails on both sides of the stairs and use them. Fix handrails that are broken or loose. Make sure that handrails are as long as the stairways. Check any carpeting to make sure that it is firmly attached to the stairs. Fix any carpet that is loose or worn. Avoid having throw rugs at the top or bottom of the stairs. If you do have throw rugs, attach them to the floor with carpet tape. Make sure that you have a light switch at the top of the stairs and the bottom of the stairs. If you do not have them, ask someone to add them for you. What else can I do to help prevent falls? Wear shoes that: Do not have high  heels. Have rubber bottoms. Are comfortable and fit you well. Are closed at the toe. Do not wear sandals. If you use a stepladder: Make sure that it is fully opened. Do not climb a closed stepladder. Make sure that both sides of the stepladder are locked into place. Ask someone to hold it for you, if possible. Clearly mark and make sure that you can see: Any grab bars or handrails. First and last steps. Where the edge of each step is. Use tools that help you move around (mobility aids) if they are needed. These include: Canes. Walkers. Scooters. Crutches. Turn on the lights when you go into a dark area. Replace any light bulbs as soon as they burn out. Set up your furniture so you have a clear path. Avoid moving your furniture around. If any of your floors are uneven, fix them. If there are any  pets around you, be aware of where they are. Review your medicines with your doctor. Some medicines can make you feel dizzy. This can increase your chance of falling. Ask your doctor what other things that you can do to help prevent falls. This information is not intended to replace advice given to you by your health care provider. Make sure you discuss any questions you have with your health care provider. Document Released: 02/22/2009 Document Revised: 10/04/2015 Document Reviewed: 06/02/2014 Elsevier Interactive Patient Education  2017 [employer].

## 2021-06-06 NOTE — Progress Notes (Signed)
Subjective:   Gabriel Fandino Helt Sr. is a 46 y.o. male who presents for Medicare Annual/Subsequent preventive examination.  I connected with  Gabriel Brayboy Dress Sr. on 06/06/21 by a telephone enabled telemedicine application and verified that I am speaking with the correct person using two identifiers.   I discussed the limitations of evaluation and management by telemedicine. The patient expressed understanding and agreed to proceed.  Patient location: home  Provider location: Tele-Health not in office     Review of Systems     Cardiac Risk Factors include: advanced age (>45men, >24 women);diabetes mellitus;hypertension;male gender     Objective:    Today's Vitals   There is no height or weight on file to calculate BMI.  Advanced Directives 06/06/2021 05/31/2021 06/04/2020 06/01/2019 01/16/2019 01/16/2019 03/22/2017  Does Patient Have a Medical Advance Directive? No No No No No No No  Would patient like information on creating a medical advance directive? - No - Patient declined - - No - Patient declined No - Patient declined No - Patient declined    Current Medications (verified) Outpatient Encounter Medications as of 06/06/2021  Medication Sig   Ascorbic Acid (VITAMIN C) 100 MG tablet Take 500 mg by mouth daily.    aspirin 81 MG chewable tablet Chew 81 mg by mouth daily.   atorvastatin (LIPITOR) 20 MG tablet Take 20 mg by mouth daily.   doxycycline (ADOXA) 100 MG tablet Take 1 tablet (100 mg total) by mouth 2 (two) times daily for 7 days.   GLUCAGON EMERGENCY 1 MG injection INJECT 1 ML INTO MUSCLE AS DIRECTED FOR LOW BLOOD SUGAR (PER HYPOGLYCEMIA PROTOCOL)FOR UP TO 2 DOSES   insulin aspart (NOVOLOG) 100 UNIT/ML injection 8 units with breakfast, 8 units with lunch and 7 units with dinner, correction scale up to 5 units with meals   magnesium oxide (MAG-OX) 400 MG tablet Take by mouth.   Multiple Vitamins-Minerals (MULTIVITAMIN WITH MINERALS) tablet Take 1 tablet by mouth daily.    mycophenolate (MYFORTIC) 180 MG EC tablet Take 360 mg by mouth 2 (two) times daily.    PAXLOVID, 300/100, 20 x 150 MG & 10 x 100MG  TBPK Take by mouth 2 (two) times daily.   Potassium 99 MG TABS Take 99 mg by mouth.   predniSONE (DELTASONE) 5 MG tablet Take 5 mg by mouth daily.   tacrolimus (PROGRAF) 0.5 MG capsule Take 1.5 mg by mouth every 12 (twelve) hours.    amoxicillin-clavulanate (AUGMENTIN) 875-125 MG tablet Take 1 tablet by mouth 2 (two) times daily for 10 days.   No facility-administered encounter medications on file as of 06/06/2021.    Allergies (verified) Losartan and Oxycodone   History: Past Medical History:  Diagnosis Date   Cataract    bilateral   End stage renal disease ([place])    peritoneal dialysis since 01/2015   Hypertension    Type 1 diabetes ([place])    Past Surgical History:  Procedure Laterality Date   CAPD INSERTION N/A 12/06/2014   Procedure: LAPAROSCOPIC INSERTION CONTINUOUS AMBULATORY PERITONEAL DIALYSIS  (CAPD) CATHETER;  Surgeon: Katha Cabal, MD;  Location: [facility] ORS;  Service: Vascular;  Laterality: N/A;   CATARACT EXTRACTION W/ INTRAOCULAR LENS IMPLANT Bilateral 2015   CYST REMOVAL TRUNK     GYNECOMASTIA EXCISION  1993   KIDNEY TRANSPLANT     Family History  Problem Relation Age of Onset   Diabetes Mother    Diabetes Paternal Grandfather    Social History   Socioeconomic History  Marital status: Single    Spouse name: Not on file   Number of children: Not on file   Years of education: Not on file   Highest education level: Not on file  Occupational History   Occupation: disability   Tobacco Use   Smoking status: Never   Smokeless tobacco: Never  Vaping Use   Vaping Use: Never used  Substance and Sexual Activity   Alcohol use: No    Alcohol/week: 0.0 standard drinks   Drug use: No   Sexual activity: Never  Other Topics Concern   Not on file  Social History Narrative   Not on file   Social Determinants of Health   Financial  Resource Strain: Low Risk    Difficulty of Paying Living Expenses: Not hard at all  Food Insecurity: No Food Insecurity   Worried About Charity fundraiser in the Last Year: Never true   [place] in the Last Year: Never true  Transportation Needs: No Transportation Needs   Lack of Transportation (Medical): No   Lack of Transportation (Non-Medical): No  Physical Activity: Insufficiently Active   Days of Exercise per Week: 3 days   Minutes of Exercise per Session: 30 min  Stress: No Stress Concern Present   Feeling of Stress : Not at all  Social Connections: Unknown   Frequency of Communication with Friends and Family: Twice a week   Frequency of Social Gatherings with Friends and Family: Twice a week   Attends Religious Services: More than 4 times per year   Active Member of [employer] or Organizations: No   Attends Music therapist: Never   Marital Status: Not on file    Tobacco Counseling Counseling given: Not Answered   Clinical Intake:  Pre-visit preparation completed: Yes  Pain : No/denies pain     Nutritional Risks: None Diabetes: Yes CBG done?: No Did pt. bring in CBG monitor from home?: No  How often do you need to have someone help you when you read instructions, pamphlets, or other written materials from your doctor or pharmacy?: 1 - Never  Diabetic?   Yes  Nutrition Risk Assessment:  Has the patient had any N/V/D within the last 2 months?  No  Does the patient have any non-healing wounds?  No  Has the patient had any unintentional weight loss or weight gain?  No   Diabetes:  Is the patient diabetic?  Yes  If diabetic, was a CBG obtained today?  No  Did the patient bring in their glucometer from home?  No  How often do you monitor your CBG's? 5x daily.   Financial Strains and Diabetes Management:  Are you having any financial strains with the device, your supplies or your medication? No .  Does the patient want to be seen by Chronic  Care Management for management of their diabetes?  No  Would the patient like to be referred to a Nutritionist or for Diabetic Management?  No   Diabetic Exams:  Diabetic Eye Exam: Completed . Overdue for diabetic eye exam. Pt has been advised about the importance in completing this exam  Diabetic Foot Exam: Completed . Pt has been advised about the importance in completing this exam.    Interpreter Needed?: No  Information entered by :: Leroy Kennedy LPN   Activities of Daily Living In your present state of health, do you have any difficulty performing the following activities: 06/06/2021  Hearing? N  Vision? N  Difficulty concentrating  or making decisions? N  Walking or climbing stairs? N  Dressing or bathing? N  Doing errands, shopping? N  Preparing Food and eating ? N  Using the Toilet? N  In the past six months, have you accidently leaked urine? N  Do you have problems with loss of bowel control? N  Managing your Medications? N  Managing your Finances? N  Housekeeping or managing your Housekeeping? N  Some recent data might be hidden    Patient Care Team: Guadalupe Maple, MD as PCP - General (Family Medicine) Abisogun, Domenica Reamer, MD as Consulting Physician (Endocrinology) Vanita Ingles, RN as Registered Nurse (General Practice)  Indicate any recent Medical Services you may have received from other than [facility] providers in the past year (date may be approximate).     Assessment:   This is a routine wellness examination for Karsyn.  Hearing/Vision screen Hearing Screening - Comments:: No trouble hearing Vision Screening - Comments:: Up to date [facility] Fall  Dietary issues and exercise activities discussed: Current Exercise Habits: Home exercise routine, Type of exercise: walking, Time (Minutes): 30, Frequency (Times/Week): 4, Weekly Exercise (Minutes/Week): 120, Intensity: Mild   Goals Addressed             This Visit's Progress    Patient Stated        Would like to start driving       Depression Screen PHQ 2/9 Scores 06/06/2021 06/04/2020 06/01/2019 03/04/2019  PHQ - 2 Score 0 0 0 0    Fall Risk Fall Risk  06/06/2021 06/04/2020 06/01/2019  Falls in the past year? 0 0 0  Number falls in past yr: 0 - 0  Injury with Fall? 0 - 0  Risk for fall due to : - Medication side effect -  Follow up Falls evaluation completed;Falls prevention discussed Falls evaluation completed;Education provided;Falls prevention discussed -    FALL RISK PREVENTION PERTAINING TO THE HOME:  Any stairs in or around the home? No  If so, are there any without handrails? No  Home free of loose throw rugs in walkways, pet beds, electrical cords, etc? Yes  Adequate lighting in your home to reduce risk of falls? Yes   ASSISTIVE DEVICES UTILIZED TO PREVENT FALLS:  Life alert? No  Use of a cane, walker or w/c? No  Grab bars in the bathroom? No  Shower chair or bench in shower? No  Elevated toilet seat or a handicapped toilet? No   TIMED UP AND GO:  Was the test performed? No .     Cognitive Function:  Normal cognitive status assessed by direct observation by this Nurse Health Advisor. No abnormalities found.       6CIT Screen 06/04/2020 06/01/2019  What Year? 0 points 0 points  What month? 0 points 0 points  What time? 0 points 0 points  Count back from 20 2 points 0 points  Months in reverse 4 points 0 points  Repeat phrase 10 points 0 points  Total Score 16 0    Immunizations Immunization History  Administered Date(s) Administered   Influenza,inj,Quad PF,6+ Mos 04/15/2017, 05/18/2018   Pneumococcal Conjugate-13 01/14/2018   Pneumococcal Polysaccharide-23 12/05/2014, 06/17/2017   Tdap 12/05/2014, 05/31/2021    TDAP status: Up to date  Flu Vaccine status: Due, Education has been provided regarding the importance of this vaccine. Advised may receive this vaccine at local pharmacy or Health Dept. Aware to provide a copy of the vaccination  record if obtained from local pharmacy or  Health Dept. Verbalized acceptance and understanding.  Pneumococcal vaccine status: Up to date  Covid-19 vaccine status: Declined, Education has been provided regarding the importance of this vaccine but patient still declined. Advised may receive this vaccine at local pharmacy or Health Dept.or vaccine clinic. Aware to provide a copy of the vaccination record if obtained from local pharmacy or Health Dept. Verbalized acceptance and understanding.   Screening Tests Health Maintenance  Topic Date Due   INFLUENZA VACCINE  12/10/2020   OPHTHALMOLOGY EXAM  03/20/2021   COVID-19 Vaccine (1) 06/22/2021 (Originally 06/18/1976)   COLONOSCOPY (Pts 45-38yrs Insurance coverage will need to be confirmed)  06/06/2022 (Originally 12/16/2019)   HEMOGLOBIN A1C  09/17/2021   URINE MICROALBUMIN  11/14/2021   FOOT EXAM  04/03/2022   TETANUS/TDAP  06/01/2031   Hepatitis C Screening  Completed   HIV Screening  Completed   HPV VACCINES  Aged Out    Health Maintenance  Health Maintenance Due  Topic Date Due   INFLUENZA VACCINE  12/10/2020   OPHTHALMOLOGY EXAM  03/20/2021    Colonoscopy:  Patient was educated on Cologuard/Colonoscopy   patient declined at this time  Lung Cancer Screening: (Low Dose CT Chest recommended if Age 58-80 years, 30 pack-year currently smoking OR have quit w/in 15years.) does not qualify.   Lung Cancer Screening Referral:   Additional Screening:  Hepatitis C Screening: qualify; never done  Vision Screening: Recommended annual ophthalmology exams for early detection of glaucoma and other disorders of the eye. Is the patient up to date with their annual eye exam?  No  Who is the provider or what is the name of the office in which the patient attends annual eye exams? [facility] If pt is not established with a provider, would they like to be referred to a provider to establish care? No .   Dental Screening: Recommended annual  dental exams for proper oral hygiene  Community Resource Referral / Chronic Care Management: CRR required this visit?  No   CCM required this visit?  No      Plan:     I have personally reviewed and noted the following in the patients chart:   Medical and social history Use of alcohol, tobacco or illicit drugs  Current medications and supplements including opioid prescriptions. Patient is not currently taking opioid prescriptions. Functional ability and status Nutritional status Physical activity Advanced directives List of other physicians Hospitalizations, surgeries, and ER visits in previous 12 months Vitals Screenings to include cognitive, depression, and falls Referrals and appointments  In addition, I have reviewed and discussed with patient certain preventive protocols, quality metrics, and best practice recommendations. A written personalized care plan for preventive services as well as general preventive health recommendations were provided to patient.     Leroy Kennedy, LPN   [id number]   Nurse Notes:

## 2021-06-07 ENCOUNTER — Ambulatory Visit: Payer: Medicare Other

## 2021-06-26 ENCOUNTER — Ambulatory Visit (INDEPENDENT_AMBULATORY_CARE_PROVIDER_SITE_OTHER): Payer: Medicare Other | Admitting: Nurse Practitioner

## 2021-06-26 ENCOUNTER — Other Ambulatory Visit: Payer: Self-pay

## 2021-06-26 ENCOUNTER — Telehealth: Payer: Self-pay | Admitting: Family Medicine

## 2021-06-26 ENCOUNTER — Encounter: Payer: Self-pay | Admitting: Physician Assistant

## 2021-06-26 ENCOUNTER — Ambulatory Visit (INDEPENDENT_AMBULATORY_CARE_PROVIDER_SITE_OTHER): Payer: Medicare Other | Admitting: Physician Assistant

## 2021-06-26 ENCOUNTER — Encounter: Payer: Self-pay | Admitting: Nurse Practitioner

## 2021-06-26 VITALS — BP 135/84 | HR 80 | Temp 97.6°F

## 2021-06-26 DIAGNOSIS — S81852D Open bite, left lower leg, subsequent encounter: Secondary | ICD-10-CM

## 2021-06-26 DIAGNOSIS — L0390 Cellulitis, unspecified: Secondary | ICD-10-CM

## 2021-06-26 DIAGNOSIS — W540XXA Bitten by dog, initial encounter: Secondary | ICD-10-CM

## 2021-06-26 DIAGNOSIS — W540XXD Bitten by dog, subsequent encounter: Secondary | ICD-10-CM | POA: Diagnosis not present

## 2021-06-26 DIAGNOSIS — S81852A Open bite, left lower leg, initial encounter: Secondary | ICD-10-CM

## 2021-06-26 MED ORDER — CEPHALEXIN 500 MG PO CAPS: 500.0000 mg | ORAL_CAPSULE | Freq: Four times a day (QID) | ORAL | 0 refills | Status: AC

## 2021-06-26 NOTE — Progress Notes (Signed)
Brief Progress Note Patient had episode of hypoglycemia around 01/20 and had altered levels of consciousness. He thought he may have wondered into a year and was unfortunately bitten by a dog on his left leg. He was seen in the ED for this at that time. He was updated on Tetanus and was offered rabies vaccination but refused. Animals were quarantine. He did undergo laceration repair. This was done with interrupted 4-0 Monocryl sutures and steri-strips. He was given Augmentin and doxycycline and discharged home.   He presented to this PCP today for follow up and possible suture removal. However, they reported that they could not identify and sutures and referred him here for suture removal. He was also given keflex as a precaution today via his PCP.   Examination:  On examination, he has two healing wounds to the posterior left calf, there is centrally scabbing, mild, expected erythema given his healing process. This did not appear tender. No fluctuance or abscess. There was one tail of a Monocryl suture hanging out which I removed. No other sutures visible as these have likely been absorbed given they were Monocryl.   Plan:  Nothing further to add from surgical perspective. Sutures used in ED are absorbable and we will likely not see them nor need to remove them. Advised him on local wound care. Wound does not appear to be overtly infected but it is reasonable to continue Abx as this is an animal bite and he does have T1DM. Advised him on signs and symptoms of worsening including: worsening erythema, tenderness, swelling, drainage, or fever >101F. He understands it would likely be best to present to the ED should these develop.   We will be available as needed  Please note, I will not charge him for this visit.   -- Edison Simon, PA-C [facility] 06/26/2021, 11:43 AM [phone] M-F: 7am - 4pm

## 2021-06-26 NOTE — Addendum Note (Signed)
Addended by: Jon Billings on: 06/26/2021 02:20 PM   Modules accepted: Orders

## 2021-06-26 NOTE — Telephone Encounter (Signed)
Called patient to advise prescription has been sent.  No further questions from patient.

## 2021-06-26 NOTE — Assessment & Plan Note (Signed)
Patient was bitten by a dog 3 weeks ago.  Was seen in the ER and given TDAP and stitches.  ER note does not indicate how many stitches were placed.  On exam this morning, not able to identify how many stitches.  One is visible but will leave in for general surgery so they are able to identify where the stitches.  Will treat with Keflex QID due to infection.  Will work to get patient an appt with General Surgery this week.

## 2021-06-26 NOTE — Telephone Encounter (Signed)
Copied from [place] [id number]. Topic: General - Other >> Jun 26, 2021 12:59 PM Gabriel Huff A wrote: Reason for CRM: Patient called in to inform Jon Billings stated that he went to the pharmacy and there was no medication there for him. Would like a call back at Ph# [phone]

## 2021-06-26 NOTE — Progress Notes (Signed)
BP 135/84    Pulse 80    Temp 97.6 F (36.4 C) (Oral)    SpO2 99%    Subjective:    Patient ID: Gabriel Mannan Sr., male    DOB: 04-Mar-1976, 46 y.o.   MRN: [id number]  HPI: Gabriel LUBITZ Sr. is a 46 y.o. male  Chief Complaint  Patient presents with   Suture / Staple Removal    Pt states he was bit by a dog about 3 weeks ago. Seen at [facility], states he does not know how many stitches were put in     Patient presents to clinic to have stiches removed that were placed in the ER.  Patient states he does not know how many stiches were placed. They did not instruct him on when to have them removed.  Denies fever, redness going up his leg, and severe pain.  Does have some pain especially when putting pressure on the area.    Relevant past medical, surgical, family and social history reviewed and updated as indicated. Interim medical history since our last visit reviewed. Allergies and medications reviewed and updated.  Review of Systems  Skin:        Dog bite on left calf   Per HPI unless specifically indicated above     Objective:    BP 135/84    Pulse 80    Temp 97.6 F (36.4 C) (Oral)    SpO2 99%   Wt Readings from Last 3 Encounters:  05/31/21 150 lb (68 kg)  04/03/21 158 lb 12.8 oz (72 kg)  06/04/20 159 lb (72.1 kg)    Physical Exam Vitals and nursing note reviewed.  Constitutional:      General: He is not in acute distress.    Appearance: Normal appearance. He is not ill-appearing, toxic-appearing or diaphoretic.  HENT:     Head: Normocephalic.     Right Ear: External ear normal.     Left Ear: External ear normal.     Nose: Nose normal. No congestion or rhinorrhea.     Mouth/Throat:     Mouth: Mucous membranes are moist.  Eyes:     General:        Right eye: No discharge.        Left eye: No discharge.     Extraocular Movements: Extraocular movements intact.     Conjunctiva/sclera: Conjunctivae normal.     Pupils: Pupils are equal, round, and reactive to light.   Cardiovascular:     Rate and Rhythm: Normal rate and regular rhythm.     Heart sounds: No murmur heard. Pulmonary:     Effort: Pulmonary effort is normal. No respiratory distress.     Breath sounds: Normal breath sounds. No wheezing, rhonchi or rales.  Abdominal:     General: Abdomen is flat. Bowel sounds are normal.  Musculoskeletal:     Cervical back: Normal range of motion and neck supple.  Skin:    General: Skin is warm and dry.     Capillary Refill: Capillary refill takes less than 2 seconds.       Neurological:     General: No focal deficit present.     Mental Status: He is alert and oriented to person, place, and time.  Psychiatric:        Mood and Affect: Mood normal.        Behavior: Behavior normal.        Thought Content: Thought content normal.  Judgment: Judgment normal.    Results for orders placed or performed during the hospital encounter of 05/31/21  CBC  Result Value Ref Range   WBC 14.8 (H) 4.0 - 10.5 K/uL   RBC 4.61 4.22 - 5.81 MIL/uL   Hemoglobin 13.9 13.0 - 17.0 g/dL   HCT 41.7 39.0 - 52.0 %   MCV 90.5 80.0 - 100.0 fL   MCH 30.2 26.0 - 34.0 pg   MCHC 33.3 30.0 - 36.0 g/dL   RDW 12.8 11.5 - 15.5 %   Platelets 177 150 - 400 K/uL   nRBC 0.0 0.0 - 0.2 %  Basic metabolic panel  Result Value Ref Range   Sodium 135 135 - 145 mmol/L   Potassium 4.5 3.5 - 5.1 mmol/L   Chloride 109 98 - 111 mmol/L   CO2 20 (L) 22 - 32 mmol/L   Glucose, Bld 202 (H) 70 - 99 mg/dL   BUN 15 6 - 20 mg/dL   Creatinine, Ser 1.26 (H) 0.61 - 1.24 mg/dL   Calcium 9.1 8.9 - 10.3 mg/dL   GFR, Estimated >60 >60 mL/min   Anion gap 6 5 - 15  CBG monitoring, ED  Result Value Ref Range   Glucose-Capillary 171 (H) 70 - 99 mg/dL   Comment 1 Notify RN    Comment 2 Document in Chart   CBG monitoring, ED  Result Value Ref Range   Glucose-Capillary 109 (H) 70 - 99 mg/dL   Comment 1 Notify RN    Comment 2 Document in Chart   CBG monitoring, ED  Result Value Ref Range    Glucose-Capillary 139 (H) 70 - 99 mg/dL   Comment 1 Notify RN    Comment 2 Document in Chart   CBG monitoring, ED  Result Value Ref Range   Glucose-Capillary 166 (H) 70 - 99 mg/dL      Assessment & Plan:   Problem List Items Addressed This Visit       Other   Wound cellulitis - Primary    Patient was bitten by a dog 3 weeks ago.  Was seen in the ER and given TDAP and stitches.  ER note does not indicate how many stitches were placed.  On exam this morning, not able to identify how many stitches.  One is visible but will leave in for general surgery so they are able to identify where the stitches.  Will treat with Keflex QID due to infection.  Will work to get patient an appt with General Surgery this week.      Relevant Orders   Ambulatory referral to General Surgery   Dog bite   Relevant Orders   Ambulatory referral to General Surgery     Follow up plan: Return if symptoms worsen or fail to improve.

## 2021-08-05 ENCOUNTER — Telehealth: Payer: Self-pay | Admitting: *Deleted

## 2021-08-05 NOTE — Telephone Encounter (Signed)
Transition Care Management Unsuccessful Follow-up Telephone Call ? ?Date of discharge and from where:  [facility] 08-01-2021 ? ?Attempts:  1st Attempt ? ?Reason for unsuccessful TCM follow-up call:  Left voice message ? ?  ?

## 2021-08-06 NOTE — Telephone Encounter (Signed)
Transition Care Management Follow-up Telephone Call ?Date of discharge and from where: [facility]   05-03-2022 ?How have you been since you were released from the hospital? Been doing ?Any questions or concerns? No ? ?Items Reviewed: ?Did the pt receive and understand the discharge instructions provided? Yes  ?Medications obtained and verified? Yes  ?Other?  ?Any new allergies since your discharge? No  ?Dietary orders reviewed? No ?Do you have support at home? Yes  ? ?Home Care and Equipment/Supplies: ?Were home health services ordered? not applicable ?If so, what is the name of the agency?   ?Has the agency set up a time to come to the patient's home? not applicable ?Were any new equipment or medical supplies ordered?   ?What is the name of the medical supply agency?  ?Were you able to get the supplies/equipment? not applicable ?Do you have any questions related to the use of the equipment or supplies?  ? ?Functional Questionnaire: (I = Independent and D = Dependent) ?ADLs: I ? ?Bathing/Dressing- I ? ?Meal Prep- I ? ?Eating- I ? ?Maintaining continence- I ? ?Transferring/Ambulation- I ? ?Managing Meds- I ? ?Follow up appointments reviewed: ? ?PCP Hospital f/u appt confirmed?  PATIENT WILL CALL   ?[facility] f/u appt confirmed? No  Scheduled to see  on  @ . ?Are transportation arrangements needed? No  ?If their condition worsens, is the pt aware to call PCP or go to the Emergency Dept.? Yes ?Was the patient provided with contact information for the PCP's office or ED? Yes ?Was to pt encouraged to call back with questions or concerns? Yes  ?

## 2021-08-20 ENCOUNTER — Emergency Department: Payer: Medicare Other

## 2021-08-20 ENCOUNTER — Observation Stay
Admission: EM | Admit: 2021-08-20 | Discharge: 2021-08-21 | Disposition: A | Discharge status: Home / Self Care | Payer: Medicare Other | Attending: Internal Medicine | Admitting: Internal Medicine

## 2021-08-20 ENCOUNTER — Encounter: Payer: Self-pay | Admitting: Internal Medicine

## 2021-08-20 DIAGNOSIS — I120 Hypertensive chronic kidney disease with stage 5 chronic kidney disease or end stage renal disease: Secondary | ICD-10-CM | POA: Insufficient documentation

## 2021-08-20 DIAGNOSIS — Z7982 Long term (current) use of aspirin: Secondary | ICD-10-CM | POA: Diagnosis not present

## 2021-08-20 DIAGNOSIS — Z79899 Other long term (current) drug therapy: Secondary | ICD-10-CM | POA: Diagnosis not present

## 2021-08-20 DIAGNOSIS — D849 Immunodeficiency, unspecified: Secondary | ICD-10-CM | POA: Diagnosis present

## 2021-08-20 DIAGNOSIS — E10649 Type 1 diabetes mellitus with hypoglycemia without coma: Secondary | ICD-10-CM | POA: Insufficient documentation

## 2021-08-20 DIAGNOSIS — G9340 Encephalopathy, unspecified: Secondary | ICD-10-CM | POA: Diagnosis not present

## 2021-08-20 DIAGNOSIS — Z940 Kidney transplant status: Secondary | ICD-10-CM | POA: Insufficient documentation

## 2021-08-20 DIAGNOSIS — Z992 Dependence on renal dialysis: Secondary | ICD-10-CM | POA: Insufficient documentation

## 2021-08-20 DIAGNOSIS — Z794 Long term (current) use of insulin: Secondary | ICD-10-CM | POA: Insufficient documentation

## 2021-08-20 DIAGNOSIS — F329 Major depressive disorder, single episode, unspecified: Secondary | ICD-10-CM | POA: Diagnosis not present

## 2021-08-20 DIAGNOSIS — E1022 Type 1 diabetes mellitus with diabetic chronic kidney disease: Secondary | ICD-10-CM | POA: Diagnosis not present

## 2021-08-20 DIAGNOSIS — N186 End stage renal disease: Secondary | ICD-10-CM | POA: Diagnosis not present

## 2021-08-20 DIAGNOSIS — E1065 Type 1 diabetes mellitus with hyperglycemia: Secondary | ICD-10-CM | POA: Diagnosis present

## 2021-08-20 DIAGNOSIS — Z7901 Long term (current) use of anticoagulants: Secondary | ICD-10-CM | POA: Diagnosis not present

## 2021-08-20 DIAGNOSIS — E162 Hypoglycemia, unspecified: Secondary | ICD-10-CM

## 2021-08-20 DIAGNOSIS — E785 Hyperlipidemia, unspecified: Secondary | ICD-10-CM | POA: Diagnosis not present

## 2021-08-20 DIAGNOSIS — E1069 Type 1 diabetes mellitus with other specified complication: Secondary | ICD-10-CM | POA: Diagnosis present

## 2021-08-20 DIAGNOSIS — Z90410 Acquired total absence of pancreas: Secondary | ICD-10-CM | POA: Insufficient documentation

## 2021-08-20 DIAGNOSIS — R4182 Altered mental status, unspecified: Secondary | ICD-10-CM | POA: Diagnosis present

## 2021-08-20 DIAGNOSIS — E1159 Type 2 diabetes mellitus with other circulatory complications: Secondary | ICD-10-CM | POA: Diagnosis present

## 2021-08-20 DIAGNOSIS — R4589 Other symptoms and signs involving emotional state: Secondary | ICD-10-CM | POA: Diagnosis present

## 2021-08-20 LAB — CBG MONITORING, ED
Glucose-Capillary: 74 mg/dL (ref 70–99)
Glucose-Capillary: 49 mg/dL — ABNORMAL LOW (ref 70–99)
Glucose-Capillary: 113 mg/dL — ABNORMAL HIGH (ref 70–99)
Glucose-Capillary: 108 mg/dL — ABNORMAL HIGH (ref 70–99)
Glucose-Capillary: 128 mg/dL — ABNORMAL HIGH (ref 70–99)

## 2021-08-20 LAB — CBC WITH DIFFERENTIAL/PLATELET
Abs Immature Granulocytes: 0.15 10*3/uL — ABNORMAL HIGH (ref 0.00–0.07)
Basophils Absolute: 0.1 10*3/uL (ref 0.0–0.1)
Basophils Relative: 1 %
Eosinophils Absolute: 0.1 10*3/uL (ref 0.0–0.5)
Eosinophils Relative: 1 %
HCT: 42 % (ref 39.0–52.0)
Hemoglobin: 13.7 g/dL (ref 13.0–17.0)
Immature Granulocytes: 1 %
Lymphocytes Relative: 12 %
Lymphs Abs: 1.2 10*3/uL (ref 0.7–4.0)
MCH: 29.7 pg (ref 26.0–34.0)
MCHC: 32.6 g/dL (ref 30.0–36.0)
MCV: 90.9 fL (ref 80.0–100.0)
Monocytes Absolute: 1.1 10*3/uL — ABNORMAL HIGH (ref 0.1–1.0)
Monocytes Relative: 10 %
Neutro Abs: 7.8 10*3/uL — ABNORMAL HIGH (ref 1.7–7.7)
Neutrophils Relative %: 75 %
Platelets: 225 10*3/uL (ref 150–400)
RBC: 4.62 MIL/uL (ref 4.22–5.81)
RDW: 12.7 % (ref 11.5–15.5)
WBC: 10.4 10*3/uL (ref 4.0–10.5)
nRBC: 0 % (ref 0.0–0.2)

## 2021-08-20 LAB — COMPREHENSIVE METABOLIC PANEL
ALT: 42 U/L (ref 0–44)
AST: 35 U/L (ref 15–41)
Albumin: 4 g/dL (ref 3.5–5.0)
Alkaline Phosphatase: 70 U/L (ref 38–126)
Anion gap: 12 (ref 5–15)
BUN: 15 mg/dL (ref 6–20)
CO2: 25 mmol/L (ref 22–32)
Calcium: 9.4 mg/dL (ref 8.9–10.3)
Chloride: 104 mmol/L (ref 98–111)
Creatinine, Ser: 1.35 mg/dL — ABNORMAL HIGH (ref 0.61–1.24)
GFR, Estimated: >60 mL/min (ref >60)
Glucose, Bld: 99 mg/dL (ref 70–99)
Potassium: 3.3 mmol/L — ABNORMAL LOW (ref 3.5–5.1)
Sodium: 141 mmol/L (ref 135–145)
Total Bilirubin: 1.6 mg/dL — ABNORMAL HIGH (ref 0.3–1.2)
Total Protein: 7.1 g/dL (ref 6.5–8.1)

## 2021-08-20 LAB — CK: Total CK: 75 U/L (ref 49–397)

## 2021-08-20 MED ORDER — PREDNISONE 10 MG PO TABS: 5.0000 mg | ORAL_TABLET | Freq: Every day | ORAL | Status: DC

## 2021-08-20 MED ORDER — ENOXAPARIN SODIUM 40 MG/0.4ML IJ SOSY
40.0000 mg | PREFILLED_SYRINGE | INTRAMUSCULAR | Status: DC
  Administered 2021-08-20: 40 mg via SUBCUTANEOUS
  Filled 2021-08-20: qty 0.4

## 2021-08-20 MED ORDER — ONDANSETRON HCL 4 MG/2ML IJ SOLN: 4.0000 mg | Freq: Four times a day (QID) | INTRAMUSCULAR | Status: DC | PRN

## 2021-08-20 MED ORDER — ASCORBIC ACID 500 MG PO TABS: 500.0000 mg | ORAL_TABLET | Freq: Every day | ORAL | Status: DC

## 2021-08-20 MED ORDER — ONDANSETRON HCL 4 MG PO TABS: 4.0000 mg | ORAL_TABLET | Freq: Four times a day (QID) | ORAL | Status: DC | PRN

## 2021-08-20 MED ORDER — ATORVASTATIN CALCIUM 20 MG PO TABS
20.0000 mg | ORAL_TABLET | Freq: Every day | ORAL | Status: DC
Start: 2021-08-20 — End: 2021-08-21

## 2021-08-20 MED ORDER — DEXTROSE 10 % IV SOLN: INTRAVENOUS | Status: DC

## 2021-08-20 MED ORDER — DEXTROSE 50 % IV SOLN
25.0000 mL | Freq: Once | INTRAVENOUS | Status: AC
  Administered 2021-08-20: 25 mL via INTRAVENOUS
  Filled 2021-08-20: qty 50

## 2021-08-20 MED ORDER — MYCOPHENOLATE SODIUM 180 MG PO TBEC
360.0000 mg | DELAYED_RELEASE_TABLET | Freq: Two times a day (BID) | ORAL | Status: DC
  Administered 2021-08-20: 360 mg via ORAL
  Filled 2021-08-20 (×2): qty 2

## 2021-08-20 MED ORDER — ACETAMINOPHEN 325 MG PO TABS: 650.0000 mg | ORAL_TABLET | Freq: Four times a day (QID) | ORAL | Status: DC | PRN

## 2021-08-20 MED ORDER — ACETAMINOPHEN 650 MG RE SUPP: 650.0000 mg | Freq: Four times a day (QID) | RECTAL | Status: DC | PRN

## 2021-08-20 MED ORDER — ADULT MULTIVITAMIN W/MINERALS CH: 1.0000 | ORAL_TABLET | Freq: Every day | ORAL | Status: DC

## 2021-08-20 MED ORDER — MAGNESIUM OXIDE -MG SUPPLEMENT 400 (240 MG) MG PO TABS: 400.0000 mg | ORAL_TABLET | Freq: Every day | ORAL | Status: DC

## 2021-08-20 MED ORDER — TACROLIMUS 0.5 MG PO CAPS
1.5000 mg | ORAL_CAPSULE | Freq: Two times a day (BID) | ORAL | Status: DC
  Administered 2021-08-20: 1.5 mg via ORAL
  Filled 2021-08-20 (×2): qty 1

## 2021-08-20 NOTE — Hospital Course (Signed)
Mr. Gabriel Huff is a 46 year old male with history of insulin-dependent diabetes mellitus type 1, hyperlipidemia,Hypertension, history of end-stage renal disease previously on peritoneal dialysis, nonmelanoma skin cancer, condyloma memory issues, history of BK uremia, history of kidney and pancreas transplant in 03/18/2020, on immunosuppression, who presents to the emergency department via EMS for chief concerns of hypoglycemia. ? ?Per nursing note, patient was found to be hypoglycemic with B GL in the 30s for around 4 hours via family, EMS arrived and gave patient 250 mL of D10 and patient's blood glucose improved to 74 in the ED. ? ?Initial vitals in the emergency department showed temperature of 97.6, respiration rate of 10, heart rate 98, blood pressure 163/102, SPO2 100% on room air. ? ?Serum sodium 141, potassium 3.3, chloride 104, bicarb 25, BUN 15, serum creatinine of 1.35, GFR greater than 60, nonfasting blood glucose 99, WBC 10.4, hemoglobin 13.7, platelets of 225. ? ?CK was 75. ? ?Repeat blood glucose at 2019 showed it was 49. ? ?ED treatment: Patient received D50 1 amp, and was started on D10 infusion. ?

## 2021-08-20 NOTE — Assessment & Plan Note (Signed)
Continue atorvastatin 20 mg daily. 

## 2021-08-20 NOTE — Assessment & Plan Note (Addendum)
-   Presumed secondary to hypoglycemia ?- Continue D10 infusion ?- Status post insulin pump removed in the ED ?

## 2021-08-20 NOTE — ED Provider Notes (Signed)
? ?[facility] ?Provider Note ? ? ? Event Date/Time  ? First MD Initiated Contact with Patient 08/20/21 1839   ?  (approximate) ? ? ?History  ? ?Hypoglycemia ? ? ?HPI ? ?Gabriel Dreisbach Goggins Sr. is a 46 y.o. male who was brought in by EMS for low blood sugar.  EMS reports they found the patient on the floor covered in sweat and saliva and possibly urine.  He apparently had been on the floor for some time.  They report that EMS has been out of the house at least 4 times in the last 2 weeks for episodes of low blood sugar.  Patient has an insulin pump and one of her sensors that you put on your skin that runs constantly.  He has it on the back of his shoulder because he does not like it on his arm or his belly.  It does not seem to be working.  When asked when he changes that he said whenever it tells me to.  Patient has Humulin and in his pump.  Blood sugar was in the 32 range when EMS arrived.  Pump was still running.  They turned it off and took out the batteries.  Patient is got D10 blood sugar went up to 138 and is down in the 70s again.  Patient is very shaky sweaty and slow to answer questions.  Patient's daughter-in-law all reportedly (per EMS) says patient does not eat much and then binges on sweets and adjust his insulin to cover his glucose. ?I attempted to call his endocrinologist Dr. Honor Junes at [facility] .  However Dr. Honor Junes does not have the phone number we can contact him out and he is not on the secure chat that I can contact him at either.  I do not see any way to let him know about this information. ?It is my opinion that this gentleman probably showed him on to have a insulin pump. ?  ? ? ?Physical Exam  ? ?Triage Vital Signs: ?ED Triage Vitals [08/20/21 1856]  ?Enc Vitals Group  ?   BP   ?   Pulse   ?   Resp   ?   Temp 97.6 ?F (36.4 ?C)  ?   Temp Source Oral  ?   SpO2   ?   Weight 160 lb (72.6 kg)  ?   Height   ?   Head Circumference   ?   Peak Flow   ?   Pain Score   ?    Pain Loc   ?   Pain Edu?   ?   Excl. in [place]?   ? ? ?Most recent vital signs: ?Vitals:  ? 08/20/21 1856  ?Temp: 97.6 ?F (36.4 ?C)  ? ? ? ?General: Awake, alert somewhat slow to respond ?Head normocephalic atraumatic but there is a an abrasion on the right frontal parietal area consistent with hitting his head. ?Eyes pupils equal round reactive light extraocular movements intact except for that the left pupil is slightly irregular apparently since he had cataract surgery in that eye years ago. ?CV:  Good peripheral perfusion.  Heart regular rate and rhythm no audible murmurs ?Resp:  Normal effort.  Lungs are clear ?Abd:  No distention.  Soft and nontender ?Extremities patient moving all extremities equally and well there is no edema ? ? ?ED Results / Procedures / Treatments  ? ?Labs ?(all labs ordered are listed, but only abnormal results are displayed) ?Labs Reviewed  ?  COMPREHENSIVE METABOLIC PANEL - Abnormal; Notable for the following components:  ?    Result Value  ? Potassium 3.3 (*)   ? Creatinine, Ser 1.35 (*)   ? Total Bilirubin 1.6 (*)   ? All other components within normal limits  ?CBC WITH DIFFERENTIAL/PLATELET - Abnormal; Notable for the following components:  ? Neutro Abs 7.8 (*)   ? Monocytes Absolute 1.1 (*)   ? Abs Immature Granulocytes 0.15 (*)   ? All other components within normal limits  ?CBG MONITORING, ED - Abnormal; Notable for the following components:  ? Glucose-Capillary 49 (*)   ? All other components within normal limits  ?CK  ?CBG MONITORING, ED  ? ? ? ?EKG ? ? ? ? ?RADIOLOGY ? ? ? ?PROCEDURES: ? ?Critical Care performed: Echo care time half an hour.  This includes discussing the patient's history with him and reviewing it in the old records.  Also discussed the patient with EMS.  We have been monitoring his glucoses carefully and adjusting his sugars by increasing his dosage of dextrose.  I am talking to the hospitalist. ? ?Procedures ? ? ?MEDICATIONS ORDERED IN ED: ?Medications  ?dextrose  10 % infusion ( Intravenous New Bag/Given 08/20/21 1857)  ?dextrose 50 % solution 25 mL (has no administration in time range)  ? ? ? ?IMPRESSION / MDM / ASSESSMENT AND PLAN / ED COURSE  ?I reviewed the triage vital signs and the nursing notes. ? ?Patient's blood sugar dropped initially and was started on D10 IV.  Patient continued on D10 IV and blood sugar dropped further so we are giving him D50 half-and-half IV we will continue to watch his sugars carefully.  We will have to admit him as he cannot keep his sugars up.  This is probably because he was getting Humulin and a longer acting form of insulin in his pump. ? ? ? ?The patient is on the cardiac monitor to evaluate for evidence of arrhythmia and/or significant heart rate changes.  None have been seen ? ?  ? ? ?FINAL CLINICAL IMPRESSION(S) / ED DIAGNOSES  ? ?Final diagnoses:  ?Hypoglycemia  ? ? ? ?Rx / DC Orders  ? ?ED Discharge Orders   ? ? None  ? ?  ? ? ? ?Note:  This document was prepared using Dragon voice recognition software and may include unintentional dictation errors. ?  ?Nena Polio, MD ?08/20/21 2027 ? ?

## 2021-08-20 NOTE — Assessment & Plan Note (Signed)
-   Per transplant note on 08/01/2021: Patient is on Prograf 1.5 mg twice daily, Myfortic 360 mg twice daily, prednisone 5 mg daily ?

## 2021-08-20 NOTE — Assessment & Plan Note (Addendum)
-   Flat affect, limited eye contact, stares at the ceiling when laying in his room in bed ?- Family states that patient does not take care of himself, fast during the day and eats junk/sugary food at night ?- Family left for couple of hours and patient destroyed the house, and is found lethargic due to severe hypoglycemia in the 30s ?- Behavioral health services has been consulted for evaluation of depression ?

## 2021-08-20 NOTE — Assessment & Plan Note (Signed)
-   Continue D10 infusion ?- Blood glucose check every 1 hour, 9 events ?

## 2021-08-20 NOTE — ED Triage Notes (Signed)
Pt arrives via EMS from home due to hypoglycemia. Pt was found by family who reported his BGL to be in the 30's for around four hrs. EMS sts that they gave 219ml of D 10. Pt glucose when pt got to the ED is 74. ?

## 2021-08-20 NOTE — H&P (Signed)
?History and Physical  ? ?CHRISTPHER STOGSDILL Sr. YQM:[id number] DOB: 29-Apr-1976 DOA: 08/20/2021 ? ?PCP: Guadalupe Maple, MD  ?Outpatient Specialists: Dr. Honor Junes, endocrinology; [facility] ?Patient coming from: Home via EMS ? ?I have personally briefly reviewed patient's old medical records in [place]. ? ?Chief Concern: Altered mental status ? ?HPI: Mr. Gabriel Huff is a 46 year old male with history of insulin-dependent diabetes mellitus type 1, hyperlipidemia,Hypertension, history of end-stage renal disease previously on peritoneal dialysis, nonmelanoma skin cancer, condyloma memory issues, history of BK uremia, history of kidney and pancreas transplant in 03/18/2020, on immunosuppression, who presents to the emergency department via EMS for chief concerns of hypoglycemia. ? ?Per nursing note, patient was found to be hypoglycemic with B GL in the 30s for around 4 hours via family, EMS arrived and gave patient 250 mL of D10 and patient's blood glucose improved to 74 in the ED. ? ?Initial vitals in the emergency department showed temperature of 97.6, respiration rate of 10, heart rate 98, blood pressure 163/102, SPO2 100% on room air. ? ?Serum sodium 141, potassium 3.3, chloride 104, bicarb 25, BUN 15, serum creatinine of 1.35, GFR greater than 60, nonfasting blood glucose 99, WBC 10.4, hemoglobin 13.7, platelets of 225. ? ?CK was 75. ? ?Repeat blood glucose at 2019 showed it was 49. ? ?ED treatment: Patient received D50 1 amp, and was started on D10 infusion. ? ?At bedside he is able to tell me his full name, his age, current calendar year and his current location. ? ?He reports that he had passed out and hit his head. ? ?Social history: His eldest son and wife are living with him. He denies etoh and tobacco use. He denies recreational drug use. 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 ? ?ROS: ?Constitutional: no weight change, no fever ?ENT/Mouth: no sore throat, no  rhinorrhea ?Eyes: no eye pain, no vision changes ?Cardiovascular: no chest pain, no dyspnea,  no edema, no palpitations ?Respiratory: no cough, no sputum, no wheezing ?Gastrointestinal: no nausea, no vomiting, no diarrhea, no constipation ?Genitourinary: no urinary incontinence, no dysuria, no hematuria ?Musculoskeletal: no arthralgias, no myalgias ?Skin: no skin lesions, no pruritus, ?Neuro: + weakness, no loss of consciousness, no syncope ?Psych: no anxiety, no depression, + decrease appetite ?Heme/Lymph: no bruising, no bleeding ? ?ED Course: Discussed with emergency medicine provider, patient requiring hospitalization for chief concerns of altered mental status secondary to hypoglycemia. ? ?Assessment/Plan ? ?Principal Problem: ?  Acute encephalopathy ?Active Problems: ?  History of kidney transplant ?  Hypertension associated with diabetes ([place]) ?  Hyperlipidemia due to type 1 diabetes mellitus ([place]) ?  Immunosuppression ([place]) ?  Depressed mood ?  History of pancreatectomy ?  Hypoglycemia ?  ?Assessment and Plan: ? ?* Acute encephalopathy ?- Presumed secondary to hypoglycemia ?- Continue D10 infusion ?- Status post insulin pump removed in the ED ? ?Hypoglycemia ?- Continue D10 infusion ?- Blood glucose check every 1 hour, 9 events ? ?Depressed mood ?- Flat affect, limited eye contact, stares at the ceiling when laying in his room in bed ?- Family states that patient does not take care of himself, fast during the day and eats junk/sugary food at night ?- Family left for couple of hours and patient destroyed the house, and is found lethargic due to severe hypoglycemia in the 30s ?- Behavioral health services has been consulted for evaluation of depression ? ?Immunosuppression ([place]) ?- Resumed home mycophenolate 360 mg p.o. twice daily, tacrolimus 1.5 mg  p.o. every 12 hours, prednisone 5 mg daily ?- Patient endorsed compliance with immunosuppression medications ? ?Hyperlipidemia due to type 1 diabetes mellitus  ([place]) ?- Continue atorvastatin 20 mg daily ? ?History of kidney transplant ?- Per transplant note on 08/01/2021: Patient is on Prograf 1.5 mg twice daily, Myfortic 360 mg twice daily, prednisone 5 mg daily ? ?Chart reviewed.  ? ?DVT prophylaxis: Enoxaparin ?Code Status: full code  ?Diet: Heart healthy ?Family Communication: Attempted to call mother at [phone], mother states that she is sleepy and advised me to call patient's the father.  Mother was not able to tell me when the last time she saw her son was.  I called father Satya Buttram at [phone], no pickup and no voicemail available.  I attempted to call the home number listed [phone], nobody ?Disposition Plan: Pending clinical course, anticipate less than 2 nights ?Consults called: Behavioral health and diabetes educator ?Admission status: Progressive cardiac, observe ? ?Past Medical History:  ?Diagnosis Date  ? Cataract   ? bilateral  ? End stage renal disease ([place])   ? peritoneal dialysis since 01/2015  ? Hypertension   ? Type 1 diabetes ([place])   ? ?Past Surgical History:  ?Procedure Laterality Date  ? CAPD INSERTION N/A 12/06/2014  ? Procedure: LAPAROSCOPIC INSERTION CONTINUOUS AMBULATORY PERITONEAL DIALYSIS  (CAPD) CATHETER;  Surgeon: Katha Cabal, MD;  Location: [facility] ORS;  Service: Vascular;  Laterality: N/A;  ? CATARACT EXTRACTION W/ INTRAOCULAR LENS IMPLANT Bilateral 2015  ? CYST REMOVAL TRUNK    ? [name]  ? KIDNEY TRANSPLANT    ? ?Social History:  reports that he has never smoked. He has never used smokeless tobacco. He reports that he does not drink alcohol and does not use drugs. ? ?Allergies  ?Allergen Reactions  ? Losartan Swelling and Other (See Comments)  ?  Reaction:  Feet/ankle swelling   ? Oxycodone Nausea Only  ? ?Family History  ?Problem Relation Age of Onset  ? Diabetes Mother   ? Diabetes Paternal Grandfather   ? ?Family history: Family history reviewed and not pertinent ? ?Prior to Admission medications    ?Medication Sig Start Date End Date Taking? Authorizing Provider  ?Ascorbic Acid (VITAMIN C) 100 MG tablet Take 500 mg by mouth daily.    Yes [provider]  ?aspirin 81 MG chewable tablet Chew 81 mg by mouth daily.   Yes [provider]  ?atorvastatin (LIPITOR) 20 MG tablet Take 20 mg by mouth daily.   Yes [provider]  ?GLUCAGON EMERGENCY 1 MG injection INJECT 1 ML INTO MUSCLE AS DIRECTED FOR LOW BLOOD SUGAR (PER HYPOGLYCEMIA PROTOCOL)FOR UP TO 2 DOSES 01/22/19  Yes [provider]  ?insulin aspart (NOVOLOG) 100 UNIT/ML injection 8 units with breakfast, 8 units with lunch and 7 units with dinner, correction scale up to 5 units with meals 01/25/19  Yes [provider]  ?magnesium oxide (MAG-OX) 400 MG tablet Take by mouth. 07/23/20  Yes [provider]  ?Multiple Vitamins-Minerals (MULTIVITAMIN WITH MINERALS) tablet Take 1 tablet by mouth daily.   Yes [provider]  ?mycophenolate (MYFORTIC) 180 MG EC tablet Take 360 mg by mouth 2 (two) times daily.    Yes [provider]  ?Potassium 99 MG TABS Take 99 mg by mouth.   Yes [provider]  ?predniSONE (DELTASONE) 5 MG tablet Take 5 mg by mouth daily. 02/25/19  Yes [provider]  ?tacrolimus (PROGRAF) 0.5 MG capsule Take 1.5 mg by mouth  every 12 (twelve) hours.  02/25/19  Yes [provider]  ? ?Physical Exam: ?Vitals:  ? 08/20/21 2000 08/20/21 2030 08/20/21 2324 08/21/21 0000  ?BP:   136/87 (!) 141/83  ?Pulse: 95 90  99  ?Resp: 11 15  17   ?Temp:   97.8 ?F (36.6 ?C) 98 ?F (36.7 ?C)  ?TempSrc:   Oral   ?SpO2: 99% 99%  99%  ?Weight:      ? ?Constitutional: appears age-appropriate, NAD, calm, comfortable ?Eyes: PERRL, lids and conjunctivae normal ?ENMT: Mucous membranes are moist. Posterior pharynx clear of any exudate or lesions. Age-appropriate dentition. Hearing appropriate ?Neck: normal, supple, no masses, no thyromegaly ?Respiratory: clear to auscultation  bilaterally, no wheezing, no crackles. Normal respiratory effort. No accessory muscle use.  ?Cardiovascular: Regular rate and rhythm, no murmurs / rubs / gallops. No extremity edema. 2+ pedal pulses. No carotid bruits.  ?Abd

## 2021-08-20 NOTE — Assessment & Plan Note (Signed)
-   Resumed home mycophenolate 360 mg p.o. twice daily, tacrolimus 1.5 mg p.o. every 12 hours, prednisone 5 mg daily ?- Patient endorsed compliance with immunosuppression medications ?

## 2021-08-21 DIAGNOSIS — E162 Hypoglycemia, unspecified: Secondary | ICD-10-CM

## 2021-08-21 DIAGNOSIS — Z940 Kidney transplant status: Secondary | ICD-10-CM

## 2021-08-21 DIAGNOSIS — R4589 Other symptoms and signs involving emotional state: Secondary | ICD-10-CM

## 2021-08-21 DIAGNOSIS — G9340 Encephalopathy, unspecified: Secondary | ICD-10-CM | POA: Diagnosis not present

## 2021-08-21 LAB — CBG MONITORING, ED
Glucose-Capillary: 188 mg/dL — ABNORMAL HIGH (ref 70–99)
Glucose-Capillary: 234 mg/dL — ABNORMAL HIGH (ref 70–99)
Glucose-Capillary: 263 mg/dL — ABNORMAL HIGH (ref 70–99)
Glucose-Capillary: 275 mg/dL — ABNORMAL HIGH (ref 70–99)
Glucose-Capillary: 281 mg/dL — ABNORMAL HIGH (ref 70–99)
Glucose-Capillary: 290 mg/dL — ABNORMAL HIGH (ref 70–99)
Glucose-Capillary: 302 mg/dL — ABNORMAL HIGH (ref 70–99)
Glucose-Capillary: 311 mg/dL — ABNORMAL HIGH (ref 70–99)
Glucose-Capillary: 328 mg/dL — ABNORMAL HIGH (ref 70–99)

## 2021-08-21 LAB — BASIC METABOLIC PANEL
Anion gap: 7 (ref 5–15)
BUN: 14 mg/dL (ref 6–20)
CO2: 24 mmol/L (ref 22–32)
Calcium: 8.8 mg/dL — ABNORMAL LOW (ref 8.9–10.3)
Chloride: 104 mmol/L (ref 98–111)
Creatinine, Ser: 1.18 mg/dL (ref 0.61–1.24)
GFR, Estimated: >60 mL/min (ref >60)
Glucose, Bld: 299 mg/dL — ABNORMAL HIGH (ref 70–99)
Potassium: 4.6 mmol/L (ref 3.5–5.1)
Sodium: 135 mmol/L (ref 135–145)

## 2021-08-21 LAB — CBC
HCT: 38.4 % — ABNORMAL LOW (ref 39.0–52.0)
Hemoglobin: 12.7 g/dL — ABNORMAL LOW (ref 13.0–17.0)
MCH: 29.9 pg (ref 26.0–34.0)
MCHC: 33.1 g/dL (ref 30.0–36.0)
MCV: 90.4 fL (ref 80.0–100.0)
Platelets: 178 10*3/uL (ref 150–400)
RBC: 4.25 MIL/uL (ref 4.22–5.81)
RDW: 12.8 % (ref 11.5–15.5)
WBC: 9.1 10*3/uL (ref 4.0–10.5)
nRBC: 0 % (ref 0.0–0.2)

## 2021-08-21 LAB — HIV ANTIBODY (ROUTINE TESTING W REFLEX): HIV Screen 4th Generation wRfx: NONREACTIVE

## 2021-08-21 NOTE — Consult Note (Signed)
[facility] Face-to-Face Psychiatry Consult  ? ?Reason for Consult:  "Flat affect, not taking care of himself" ?Referring Physician:  Amy Bartoletti ?Patient Identification: Gabriel Burgueno Starn Sr. ?MRN:  [id number] ?Principal Diagnosis: Depressed mood ?Diagnosis:  Principal Problem: ?  Depressed mood ?Active Problems: ?  History of kidney transplant ?  Uncontrolled type 1 diabetes mellitus with hyperglycemia ([place]) ?  Hypertension associated with diabetes ([place]) ?  Acute encephalopathy ?  Hyperlipidemia due to type 1 diabetes mellitus ([place]) ?  Immunosuppression ([place]) ?  History of pancreatectomy ?  Hypoglycemia ? ? ?Total Time spent with patient: 30 minutes ? ?Subjective:  "I am definitely not suicidal" ?Gabriel Penado Appelt Sr. is a 46 y.o. male patient admitted with hypoglycemia. Consult for above.  ? ?HPI: Patient seen and chart reviewed.  Upon approach, patient was laying on the gurney, alert and oriented x4.  He states that he is ready to be discharged.  He makes intermittent eye contact.  He smiles appropriately.  Linear speech, appropriate conversation.  He disputes the chart notes which indicate that perhaps he has not "taking care of himself."  He says that he does eat properly.  He says he was working out in the yard and did not realize that his sugar was getting low but he went inside to get something to eat when his sensor went off.  He says that he fell over and perhaps not some things over.  He says that is all he remembers.  Patient describes protective factors as his faith and his family.  He states that he lives with his son and daughter-in-law and their 2 children, which brings him a lot of joy.  He says he has 2 other grandchildren.  He reports that he has no thought or intention of suicide. ? 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  He was diagnosed with diabetes at age 8.  He had a pancreas transplant that "did not work out."  Kidney was transplanted  at the same time.  He enjoys spending time in the yard, time with his grandchildren, and collecting different types of toys.  ?Patient reports seeing his pastor for some counseling in the past, but has never seen a professional therapist.  He states that he would consider doing them at some point if he needed to, but he does not feel the need for therapy at this time.  He denies depression.  States that he has never seen a mental health provider and has never been on any mental health medications. ?Patient is calm, cooperative, pleasant.  He denies suicidal or homicidal ideations.  Denies auditory or visual hallucinations or paranoia.  He does not meet criteria for psychiatric hospitalization. ? ?Past Psychiatric History: Denies ? ?Risk to Self:   ?Risk to Others:   ?Prior Inpatient Therapy:   ?Prior Outpatient Therapy:   ? ?Past Medical History:  ?Past Medical History:  ?Diagnosis Date  ? Cataract   ? bilateral  ? End stage renal disease ([place])   ? peritoneal dialysis since 01/2015  ? Hypertension   ? Type 1 diabetes ([place])   ?  ?Past Surgical History:  ?Procedure Laterality Date  ? CAPD INSERTION N/A 12/06/2014  ? Procedure: LAPAROSCOPIC INSERTION CONTINUOUS AMBULATORY PERITONEAL DIALYSIS  (CAPD) CATHETER;  Surgeon: Katha Cabal, MD;  Location: [facility] ORS;  Service: Vascular;  Laterality: N/A;  ? CATARACT EXTRACTION W/ INTRAOCULAR LENS IMPLANT Bilateral 2015  ? CYST  REMOVAL TRUNK    ? [place]  ? KIDNEY TRANSPLANT    ? ?Family History:  ?Family History  ?Problem Relation Age of Onset  ? Diabetes Mother   ? Diabetes Paternal Grandfather   ? ?Family Psychiatric  History: None reported ?Social History:  ?Social History  ? ?Substance and Sexual Activity  ?Alcohol Use No  ? Alcohol/week: 0.0 standard drinks  ?   ?Social History  ? ?Substance and Sexual Activity  ?Drug Use No  ?  ?Social History  ? ?Socioeconomic History  ? Marital status: Single  ?  Spouse name: Not on file  ? Number of children: Not  on file  ? Years of education: Not on file  ? Highest education level: Not on file  ?Occupational History  ? Occupation: disability   ?Tobacco Use  ? Smoking status: Never  ? Smokeless tobacco: Never  ?Vaping Use  ? Vaping Use: Never used  ?Substance and Sexual Activity  ? Alcohol use: No  ?  Alcohol/week: 0.0 standard drinks  ? Drug use: No  ? Sexual activity: Never  ?Other Topics Concern  ? Not on file  ?Social History Narrative  ? Not on file  ? ?Social Determinants of Health  ? ?Financial Resource Strain: Low Risk   ? Difficulty of Paying Living Expenses: Not hard at all  ?Food Insecurity: No Food Insecurity  ? Worried About Charity fundraiser in the Last Year: Never true  ? Ran Out of Food in the Last Year: Never true  ?Transportation Needs: No Transportation Needs  ? Lack of Transportation (Medical): No  ? Lack of Transportation (Non-Medical): No  ?Physical Activity: Insufficiently Active  ? Days of Exercise per Week: 3 days  ? Minutes of Exercise per Session: 30 min  ?Stress: No Stress Concern Present  ? Feeling of Stress : Not at all  ?Social Connections: Unknown  ? Frequency of Communication with Friends and Family: Twice a week  ? Frequency of Social Gatherings with Friends and Family: Twice a week  ? Attends Religious Services: More than 4 times per year  ? Active Member of Clubs or Organizations: No  ? Attends Archivist Meetings: Never  ? Marital Status: Not on file  ? ?Additional Social History: ?  ? ?Allergies:   ?Allergies  ?Allergen Reactions  ? Losartan Swelling and Other (See Comments)  ?  Reaction:  Feet/ankle swelling   ? Oxycodone Nausea Only  ? ? ?Labs:  ?Results for orders placed or performed during the hospital encounter of 08/20/21 (from the past 48 hour(s))  ?Comprehensive metabolic panel     Status: Abnormal  ? Collection Time: 08/20/21  6:41 PM  ?Result Value Ref Range  ? Sodium 141 135 - 145 mmol/L  ? Potassium 3.3 (L) 3.5 - 5.1 mmol/L  ? Chloride 104 98 - 111 mmol/L  ? CO2  25 22 - 32 mmol/L  ? Glucose, Bld 99 70 - 99 mg/dL  ?  Comment: Glucose reference range applies only to samples taken after fasting for at least 8 hours.  ? BUN 15 6 - 20 mg/dL  ? Creatinine, Ser 1.35 (H) 0.61 - 1.24 mg/dL  ? Calcium 9.4 8.9 - 10.3 mg/dL  ? Total Protein 7.1 6.5 - 8.1 g/dL  ? Albumin 4.0 3.5 - 5.0 g/dL  ? AST 35 15 - 41 U/L  ? ALT 42 0 - 44 U/L  ? Alkaline Phosphatase 70 38 - 126 U/L  ? Total  Bilirubin 1.6 (H) 0.3 - 1.2 mg/dL  ? GFR, Estimated >60 >60 mL/min  ?  Comment: (NOTE) ?Calculated using the CKD-EPI Creatinine Equation (2021) ?  ? Anion gap 12 5 - 15  ?  Comment: Performed at [facility], [address], [place], [place] [address]  ?CBC with Differential     Status: Abnormal  ? Collection Time: 08/20/21  6:41 PM  ?Result Value Ref Range  ? WBC 10.4 4.0 - 10.5 K/uL  ? RBC 4.62 4.22 - 5.81 MIL/uL  ? Hemoglobin 13.7 13.0 - 17.0 g/dL  ? HCT 42.0 39.0 - 52.0 %  ? MCV 90.9 80.0 - 100.0 fL  ? MCH 29.7 26.0 - 34.0 pg  ? MCHC 32.6 30.0 - 36.0 g/dL  ? RDW 12.7 11.5 - 15.5 %  ? Platelets 225 150 - 400 K/uL  ? nRBC 0.0 0.0 - 0.2 %  ? Neutrophils Relative % 75 %  ? Neutro Abs 7.8 (H) 1.7 - 7.7 K/uL  ? Lymphocytes Relative 12 %  ? Lymphs Abs 1.2 0.7 - 4.0 K/uL  ? Monocytes Relative 10 %  ? Monocytes Absolute 1.1 (H) 0.1 - 1.0 K/uL  ? Eosinophils Relative 1 %  ? Eosinophils Absolute 0.1 0.0 - 0.5 K/uL  ? Basophils Relative 1 %  ? Basophils Absolute 0.1 0.0 - 0.1 K/uL  ? Immature Granulocytes 1 %  ? Abs Immature Granulocytes 0.15 (H) 0.00 - 0.07 K/uL  ?  Comment: Performed at [facility], [address], [place], [place] [address]  ?POC CBG, ED     Status: None  ? Collection Time: 08/20/21  6:42 PM  ?Result Value Ref Range  ? Glucose-Capillary 74 70 - 99 mg/dL  ?  Comment: Glucose reference range applies only to samples taken after fasting for at least 8 hours.  ?CK     Status: None  ? Collection Time: 08/20/21  6:47 PM  ?Result Value Ref Range  ? Total CK 75 49 - 397 U/L  ?  Comment:  Performed at [facility], [address] [place], [place] [address]  ?POC CBG, ED     Status: Abnormal  ? Collection Time: 08/20/21  8:19 PM  ?Result Value Ref Range  ? Glucose-Capillary 49

## 2021-08-21 NOTE — Progress Notes (Signed)
IV removed. Discharge instructions given to patient. Verbalized understanding. No acute distress at this time. Patient awaiting ride.  ?

## 2021-08-21 NOTE — Progress Notes (Addendum)
Pt blood sugar at 188 at 0017. NP Foust made aware. Will continue to monitor. ? ?Update 0114: Pt blood sugar at 234. NP Foust made aware. Will continue to monitor. ? ?Update 0212: Spoke with NP Foust but no new order was place. Will continue to monitor. ? ?Update 0226: pt blood sugar at 263 NP Foust made aware but no new order was place. Will continue to monitor. ? ?Update 0329: Pt blood sugar at 290. NP Foust made aware but no new order place. Will continue to monitor. ? ?

## 2021-08-28 ENCOUNTER — Telehealth: Payer: Self-pay | Admitting: *Deleted

## 2021-08-28 NOTE — Telephone Encounter (Signed)
Transition Care Management Follow-up Telephone Call ?Date of discharge and from where: 08-22-2021 [place] ?How have you been since you were released from the hospital? Feeling good ?Any questions or concerns? No ? ?Items Reviewed: ?Did the pt receive and understand the discharge instructions provided? Yes  ?Medications obtained and verified?  ?Other? No  ?Any new allergies since your discharge? No  ?Dietary orders reviewed? No ?Do you have support at home? Yes  ? ?Home Care and Equipment/Supplies: ?Were home health services ordered? not applicable ?If so, what is the name of the agency?   ?Has the agency set up a time to come to the patient's home? not applicable ?Were any new equipment or medical supplies ordered?   ?What is the name of the medical supply agency?  ?Were you able to get the supplies/equipment? not applicable ?Do you have any questions related to the use of the equipment or supplies?  ? ?Functional Questionnaire: (I = Independent and D = Dependent) ?ADLs: I ? ?Bathing/Dressing- I ? ?Meal Prep- I ? ?Eating- I ? ?Maintaining continence- I ? ?Transferring/Ambulation- I ? ?Managing Meds- I ? ?Follow up appointments reviewed: ? ?PCP Hospital f/u appt confirmed?  Patient stated he will call to schedule    ?[facility] f/u appt confirmed? n0. ?Are transportation arrangements needed?  He will call his dad drives him to his appointments ?If their condition worsens, is the pt aware to call PCP or go to the Emergency Dept.? Yes ?Was the patient provided with contact information for the PCP's office or ED? Yes ?Was to pt encouraged to call back with questions or concerns? Yes  ?

## 2022-07-26 HISTORY — PX: TRANSPLANT PANCREATIC ALLOGRAFT: SUR1383

## 2022-08-01 ENCOUNTER — Telehealth: Payer: Self-pay | Admitting: *Deleted

## 2022-08-01 NOTE — Transitions of Care (Post Inpatient/ED Visit) (Signed)
   08/01/2022  Name: Gabriel Willever Lanahan Sr. MRN: [id number] DOB: 06-Jan-1976  Today's TOC FU Call Status: Today's TOC FU Call Status:: Unsuccessul Call (1st Attempt) Unsuccessful Call (1st Attempt) Date: 08/01/22  Attempted to reach the patient regarding the most recent Inpatient/ED visit.  Follow Up Plan: Additional outreach attempts will be made to reach the patient to complete the Transitions of Care (Post Inpatient/ED visit) call.   [facility] [phone]

## 2022-08-04 ENCOUNTER — Telehealth: Payer: Self-pay

## 2022-08-04 NOTE — Transitions of Care (Post Inpatient/ED Visit) (Signed)
   08/04/2022  Name: Gabriel Vest Michie Sr. MRN: [id number] DOB: 07-Sep-1975  Today's TOC FU Call Status: Today's TOC FU Call Status:: Unsuccessful Call (2nd Attempt) Unsuccessful Call (2nd Attempt) Date: 08/04/22  Attempted to reach the patient regarding the most recent Inpatient/ED visit.  Follow Up Plan: Additional outreach attempts will be made to reach the patient to complete the Transitions of Care (Post Inpatient/ED visit) call.   Johnney Killian, RN, BSN, CCM Care Management Coordinator [facility] Phone: [phone]: [phone]

## 2022-08-05 ENCOUNTER — Telehealth: Payer: Self-pay

## 2022-08-05 NOTE — Transitions of Care (Post Inpatient/ED Visit) (Signed)
   08/05/2022  Name: Gabriel Baechle Eplin Sr. MRN: [id number] DOB: May 27, 1975  Today's TOC FU Call Status: Today's TOC FU Call Status:: Unsuccessful Call (3rd Attempt) Unsuccessful Call (3rd Attempt) Date: 08/05/22  Attempted to reach the patient regarding the most recent Inpatient/ED visit.  Follow Up Plan: No further outreach attempts will be made at this time. We have been unable to contact the patient. Johnney Killian, RN, BSN, CCM Care Management Coordinator [facility] Phone: [phone]: [phone]

## 2022-10-14 ENCOUNTER — Ambulatory Visit: Payer: Self-pay

## 2022-10-14 ENCOUNTER — Emergency Department: Payer: Medicare Other

## 2022-10-14 ENCOUNTER — Emergency Department
Admission: EM | Admit: 2022-10-14 | Discharge: 2022-10-14 | Disposition: A | Discharge status: Home / Self Care | Payer: Medicare Other | Attending: Student in an Organized Health Care Education/Training Program | Admitting: Student in an Organized Health Care Education/Training Program

## 2022-10-14 ENCOUNTER — Other Ambulatory Visit: Payer: Self-pay

## 2022-10-14 DIAGNOSIS — S4992XA Unspecified injury of left shoulder and upper arm, initial encounter: Secondary | ICD-10-CM | POA: Diagnosis present

## 2022-10-14 DIAGNOSIS — E109 Type 1 diabetes mellitus without complications: Secondary | ICD-10-CM | POA: Diagnosis not present

## 2022-10-14 DIAGNOSIS — S42022A Displaced fracture of shaft of left clavicle, initial encounter for closed fracture: Secondary | ICD-10-CM | POA: Insufficient documentation

## 2022-10-14 DIAGNOSIS — I10 Essential (primary) hypertension: Secondary | ICD-10-CM | POA: Insufficient documentation

## 2022-10-14 DIAGNOSIS — Y9355 Activity, bike riding: Secondary | ICD-10-CM | POA: Insufficient documentation

## 2022-10-14 DIAGNOSIS — W228XXA Striking against or struck by other objects, initial encounter: Secondary | ICD-10-CM | POA: Diagnosis not present

## 2022-10-14 MED ORDER — ACETAMINOPHEN 325 MG PO TABS
650.0000 mg | ORAL_TABLET | Freq: Once | ORAL | Status: AC
  Administered 2022-10-14: 650 mg via ORAL
  Filled 2022-10-14: qty 2

## 2022-10-14 NOTE — Discharge Instructions (Signed)
It is quite possible that you will require surgery for your clavicle fracture given the degree of displacement and overlap of the fracture fragments.  Please follow-up with orthopedics, call the phone number provided to make this appointment.  Please keep your arm in the sling.  Please return for any new, worsening, or change in symptoms or other concerns.
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
## 2022-10-14 NOTE — Chronic Care Management (AMB) (Signed)
   10/14/2022  Lennon Alstrom Giaimo Sr. 1975-12-28 [id number]  Reason for Encounter: Patient is not currently enrolled in the CCM program. CCM status changed to previously enrolled   Alto Denver RN, MSN, CCM RN Care Manager  Chronic Care Management Direct Number: [phone]

## 2022-10-16 ENCOUNTER — Other Ambulatory Visit: Payer: Self-pay | Admitting: Surgery

## 2022-10-21 ENCOUNTER — Encounter
Admission: RE | Admit: 2022-10-21 | Discharge: 2022-10-21 | Disposition: A | Discharge status: Home / Self Care | Payer: Medicare Other | Source: Ambulatory Visit | Attending: Surgery | Admitting: Surgery

## 2022-10-21 ENCOUNTER — Encounter: Payer: Self-pay | Admitting: Surgery

## 2022-10-21 ENCOUNTER — Other Ambulatory Visit: Payer: Self-pay

## 2022-10-21 VITALS — Ht 66.0 in | Wt 150.0 lb

## 2022-10-21 DIAGNOSIS — Z940 Kidney transplant status: Secondary | ICD-10-CM

## 2022-10-21 DIAGNOSIS — I152 Hypertension secondary to endocrine disorders: Secondary | ICD-10-CM

## 2022-10-21 DIAGNOSIS — E1159 Type 2 diabetes mellitus with other circulatory complications: Secondary | ICD-10-CM

## 2022-10-21 HISTORY — DX: Other specified postprocedural states: Z98.890

## 2022-10-21 HISTORY — DX: Personal history of urinary calculi: Z87.442

## 2022-10-21 HISTORY — DX: Anogenital (venereal) warts: A63.0

## 2022-10-21 HISTORY — DX: Type 2 diabetes mellitus with unspecified diabetic retinopathy without macular edema: E11.319

## 2022-10-21 HISTORY — DX: Type 2 diabetes mellitus with diabetic nephropathy: E11.21

## 2022-10-21 HISTORY — DX: End stage renal disease: N18.6

## 2022-10-21 HISTORY — DX: Localized edema: R60.0

## 2022-10-21 NOTE — Patient Instructions (Addendum)
Your procedure is scheduled on: Thursday October 23, 2022. Report to the Registration Desk on the 1st floor of the [facility]. To find out your arrival time, please call [phone] between 1PM - 3PM on: Wednesday October 22, 2022. If your arrival time is 6:00 am, do not arrive before that time as the [facility] entrance doors do not open until 6:00 am.  REMEMBER: Instructions that are not followed completely may result in serious medical risk, up to and including death; or upon the discretion of your surgeon and anesthesiologist your surgery may need to be rescheduled.  Do not eat food after midnight the night before surgery.  No gum chewing or hard candies.  You may however, drink CLEAR liquids up to 2 hours before you are scheduled to arrive for your surgery. Do not drink anything within 2 hours of your scheduled arrival time.  Clear liquids include: - water   In addition, your doctor has ordered for you to drink the provided:  Gatorade G2 Drinking this carbohydrate drink up to two hours before surgery helps to reduce insulin resistance and improve patient outcomes. Please complete drinking 2 hours before scheduled arrival time.  One week prior to surgery: Stop Anti-inflammatories (NSAIDS) such as Advil, Aleve, Ibuprofen, Motrin, Naproxen, Naprosyn and Aspirin based products such as Excedrin, Goody's Powder, BC Powder. Stop ANY OVER THE COUNTER supplements until after surgery. You may however, continue to take Tylenol if needed for pain up until the day of surgery.  Continue taking all prescribed medications with the exception of the following:   Follow recommendations from Cardiologist or PCP regarding stopping blood thinners.  TAKE ONLY THESE MEDICATIONS THE MORNING OF SURGERY WITH A SIP OF WATER:  tacrolimus (PROGRAF) 0.5 MG  mycophenolate (MYFORTIC) 180 MG  famotidine (PEPCID) 20 MG Antacid (take one the night before and one on the morning of surgery - helps to prevent  nausea after surgery.) valGANciclovir (VALCYTE) 450 MG  SULFAMETHOXAZOLE 160 mg predniSONE (DELTASONE) 5 MG  aspirin 81 MG chewable    No Alcohol for 24 hours before or after surgery.  No Smoking including e-cigarettes for 24 hours before surgery.  No chewable tobacco products for at least 6 hours before surgery.  No nicotine patches on the day of surgery.  Do not use any "recreational" drugs for at least a week (preferably 2 weeks) before your surgery.  Please be advised that the combination of cocaine and anesthesia may have negative outcomes, up to and including death. If you test positive for cocaine, your surgery will be cancelled.  On the morning of surgery brush your teeth with toothpaste and water, you may rinse your mouth with mouthwash if you wish. Do not swallow any toothpaste or mouthwash.  Use CHG Soap or wipes as directed on instruction sheet.  Do not wear jewelry, make-up, hairpins, clips or nail polish.  Do not wear lotions, powders, or perfumes.   Do not shave body hair from the neck down 48 hours before surgery.  Contact lenses, hearing aids and dentures may not be worn into surgery.  Do not bring valuables to the hospital. [facility] is not responsible for any missing/lost belongings or valuables.   Total Shoulder Arthroplasty:  use Benzoyl Peroxide 5% Gel as directed on instruction sheet.  Bring your C-PAP to the hospital in case you may have to spend the night.   Notify your doctor if there is any change in your medical condition (cold, fever, infection).  Wear comfortable clothing (specific  to your surgery type) to the hospital.  After surgery, you can help prevent lung complications by doing breathing exercises.  Take deep breaths and cough every 1-2 hours. Your doctor may order a device called an Incentive Spirometer to help you take deep breaths. When coughing or sneezing, hold a pillow firmly against your incision with both hands. This is called  "splinting." Doing this helps protect your incision. It also decreases belly discomfort.  If you are being admitted to the hospital overnight, leave your suitcase in the car. After surgery it may be brought to your room.  In case of increased patient census, it may be necessary for you, the patient, to continue your postoperative care in the Same Day Surgery department.  If you are being discharged the day of surgery, you will not be allowed to drive home. You will need a responsible individual to drive you home and stay with you for 24 hours after surgery.   If you are taking public transportation, you will need to have a responsible individual with you.  Please call the Pre-admissions Testing Dept. at [phone] if you have any questions about these instructions.  Surgery Visitation Policy:  Patients having surgery or a procedure may have two visitors.  Children under the age of 38 must have an adult with them who is not the patient.  Inpatient Visitation:    Visiting hours are 7 a.m. to 8 p.m. Up to four visitors are allowed at one time in a patient room. The visitors may rotate out with other people during the day.  One visitor age 31 or older may stay with the patient overnight and must be in the room by 8 p.m. Preparing the Skin Before Surgery     To help prevent the risk of infection at your surgical site, we are now providing you with rinse-free Sage 2% Chlorhexidine Gluconate (CHG) disposable wipes.  Chlorhexidine Gluconate (CHG) Soap  o An antiseptic cleaner that kills germs and bonds with the skin to continue killing germs even after washing  o Used for showering the night before surgery and morning of surgery  The night before surgery: Shower or bathe with warm water. Do not apply perfume, lotions, powders. Wait one hour after shower. Skin should be dry and cool. Open Sage wipe package - use 6 disposable cloths. Wipe body using one cloth for the right arm, one  cloth for the left arm, one cloth for the right leg, one cloth for the left leg, one cloth for the chest/abdomen area, and one cloth for the back. Do not use on open wounds or sores. Do not use on face or genitals (private parts). If you are breast feeding, do not use on breasts. 5. Do not rinse, allow to dry. 6. Skin may feel "tacky" for several minutes. 7. Dress in clean clothes. 8. Place clean sheets on your bed and do not sleep with pets.  REPEAT ABOVE ON THE MORNING OF SURGERY BEFORE ARRIVING TO THE HOSPITAL.

## 2022-10-22 ENCOUNTER — Encounter
Admission: RE | Admit: 2022-10-22 | Discharge: 2022-10-22 | Disposition: A | Discharge status: Home / Self Care | Payer: Medicare Other | Source: Ambulatory Visit | Attending: Surgery | Admitting: Surgery

## 2022-10-22 ENCOUNTER — Telehealth: Payer: Self-pay | Admitting: Urgent Care

## 2022-10-22 DIAGNOSIS — I444 Left anterior fascicular block: Secondary | ICD-10-CM | POA: Insufficient documentation

## 2022-10-22 DIAGNOSIS — Z01810 Encounter for preprocedural cardiovascular examination: Secondary | ICD-10-CM | POA: Insufficient documentation

## 2022-10-22 DIAGNOSIS — E1159 Type 2 diabetes mellitus with other circulatory complications: Secondary | ICD-10-CM | POA: Insufficient documentation

## 2022-10-22 DIAGNOSIS — I152 Hypertension secondary to endocrine disorders: Secondary | ICD-10-CM | POA: Diagnosis not present

## 2022-10-22 DIAGNOSIS — Z940 Kidney transplant status: Secondary | ICD-10-CM | POA: Insufficient documentation

## 2022-10-22 HISTORY — DX: Other viral infections of unspecified site: B34.8

## 2022-10-22 NOTE — Progress Notes (Signed)
  Perioperative Services Pre-Admission/Anesthesia Testing    Date: 10/22/22  Name: Gabriel Damron Heyliger Sr. MRN:   [id number]  Re: Clearance for surgery (ORIF clavicle)  Clinical Notes:  Patient is scheduled for an OPEN REDUCTION INTERNAL FIXATION (ORIF) CLAVICULAR FRACTURE (Left: Shoulder) on 10/23/2022.  Patient was referred to me for preanesthesia review due to a complex past medical history significant for organ transplant.  Chart reviewed.  Patient type I diabetic with associated diabetic nephropathy. Due to associated renal failure, patient was started on peritoneal dialysis back in 01/2015. 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  Following procedures, patient is no longer on dialysis. Patient on immunosuppressive therapy using mycophenolate, prednisone, and tacrolimus.  Initial pancreas transplant failed resulting in the need for second transplant to be done on 07/26/2022. Per his report, he is no longer considered to be diabetic. In addition to the aforementioned immunosuppressive therapies, due to the fact that patient is within the 77-month window following transplant, patient also on valganciclovir at this time.  Limited time between PAT date and surgery date, making it difficult to obtain clearance documentation from patient's transplant team. I was able to communicate with transplant coordinator Marguerita Beards, RN) today. Coordinator advised that patient is cleared, from a transplant perspective, for his planned surgical procedure tomorrow. Patient will follow up with transplant team/clinic postoperatively for ongoing care and management   Impression and Plan:  Gabriel Trawick Salada Sr. Is status post SPK (03/2016) with transplant of a second pancreas (07/2022). Patient on immunosuppressive cocktail. He is closely followed by transplant nephrology. I have discussed case with attending anesthesia Randa Ngo, MD). No significant anesthesia concerns. MD did ask  that transplant +/- medicine sign off on patient proceeding with planned procedure.   Per transplant coordinator, patient is cleared to proceed with surgery as planned. He should continue scheduled RTC visits with the transplant team.  No other preoperative needs from PAT at this time.   Quentin Mulling, MSN, APRN, FNP-C, CEN [facility]  Peri-operative Services Nurse Practitioner Phone: [phone] 10/22/22 1:39 PM  NOTE: This note has been prepared using Dragon dictation software. Despite my best ability to proofread, there is always the potential that unintentional transcriptional errors may still occur from this process.

## 2022-10-23 ENCOUNTER — Other Ambulatory Visit: Payer: Self-pay

## 2022-10-23 ENCOUNTER — Ambulatory Visit: Payer: Medicare Other | Admitting: Urgent Care

## 2022-10-23 ENCOUNTER — Encounter: Payer: Self-pay | Admitting: Surgery

## 2022-10-23 ENCOUNTER — Ambulatory Visit: Payer: Medicare Other

## 2022-10-23 ENCOUNTER — Encounter
Admission: RE | Disposition: A | Discharge status: Home / Self Care | Payer: Self-pay | Source: Home / Self Care | Attending: Surgery

## 2022-10-23 ENCOUNTER — Ambulatory Visit
Admission: RE | Admit: 2022-10-23 | Discharge: 2022-10-23 | Disposition: A | Discharge status: Home / Self Care | Payer: Medicare Other | Attending: Surgery | Admitting: Surgery

## 2022-10-23 DIAGNOSIS — Z794 Long term (current) use of insulin: Secondary | ICD-10-CM | POA: Insufficient documentation

## 2022-10-23 DIAGNOSIS — N186 End stage renal disease: Secondary | ICD-10-CM | POA: Diagnosis not present

## 2022-10-23 DIAGNOSIS — E1022 Type 1 diabetes mellitus with diabetic chronic kidney disease: Secondary | ICD-10-CM | POA: Insufficient documentation

## 2022-10-23 DIAGNOSIS — Z9483 Pancreas transplant status: Secondary | ICD-10-CM | POA: Insufficient documentation

## 2022-10-23 DIAGNOSIS — I120 Hypertensive chronic kidney disease with stage 5 chronic kidney disease or end stage renal disease: Secondary | ICD-10-CM | POA: Diagnosis not present

## 2022-10-23 DIAGNOSIS — W2209XA Striking against other stationary object, initial encounter: Secondary | ICD-10-CM | POA: Diagnosis not present

## 2022-10-23 DIAGNOSIS — Z940 Kidney transplant status: Secondary | ICD-10-CM | POA: Diagnosis not present

## 2022-10-23 DIAGNOSIS — E1065 Type 1 diabetes mellitus with hyperglycemia: Secondary | ICD-10-CM | POA: Diagnosis not present

## 2022-10-23 DIAGNOSIS — S42022A Displaced fracture of shaft of left clavicle, initial encounter for closed fracture: Secondary | ICD-10-CM | POA: Diagnosis not present

## 2022-10-23 DIAGNOSIS — Y9355 Activity, bike riding: Secondary | ICD-10-CM | POA: Diagnosis not present

## 2022-10-23 HISTORY — PX: ORIF CLAVICULAR FRACTURE: SHX5055

## 2022-10-23 HISTORY — DX: Long term (current) use of unspecified immunomodulators and immunosuppressants: Z79.60

## 2022-10-23 HISTORY — DX: Other long term (current) drug therapy: Z79.899

## 2022-10-23 HISTORY — DX: Unspecified malignant neoplasm of skin, unspecified: C44.90

## 2022-10-23 SURGERY — OPEN REDUCTION INTERNAL FIXATION (ORIF) CLAVICULAR FRACTURE
Anesthesia: General | Site: Shoulder | Laterality: Left

## 2022-10-23 MED ORDER — SODIUM CHLORIDE 0.9 % IV SOLN: INTRAVENOUS | Status: DC

## 2022-10-23 MED ORDER — BUPIVACAINE-EPINEPHRINE 0.25% -1:200000 IJ SOLN
INTRAMUSCULAR | Status: DC | PRN
  Administered 2022-10-23: 15 mL

## 2022-10-23 MED ORDER — KETAMINE HCL 10 MG/ML IJ SOLN
INTRAMUSCULAR | Status: DC | PRN
  Administered 2022-10-23: 20 mg via INTRAVENOUS
  Administered 2022-10-23: 10 mg via INTRAVENOUS

## 2022-10-23 MED ORDER — DEXAMETHASONE SODIUM PHOSPHATE 10 MG/ML IJ SOLN
INTRAMUSCULAR | Status: DC | PRN
  Administered 2022-10-23: 5 mg via INTRAVENOUS

## 2022-10-23 MED ORDER — ACETAMINOPHEN 325 MG PO TABS: 325.0000 mg | ORAL_TABLET | Freq: Four times a day (QID) | ORAL | Status: DC | PRN

## 2022-10-23 MED ORDER — BUPIVACAINE-EPINEPHRINE 0.25% -1:200000 IJ SOLN
INTRAMUSCULAR | Status: DC | PRN
  Administered 2022-10-23: 35 mL via INTRAMUSCULAR

## 2022-10-23 MED ORDER — HYDROCODONE-ACETAMINOPHEN 5-325 MG PO TABS
ORAL_TABLET | ORAL | Status: AC
  Filled 2022-10-23: qty 1

## 2022-10-23 MED ORDER — METOCLOPRAMIDE HCL 5 MG/ML IJ SOLN: 5.0000 mg | Freq: Three times a day (TID) | INTRAMUSCULAR | Status: DC | PRN

## 2022-10-23 MED ORDER — ROCURONIUM BROMIDE 100 MG/10ML IV SOLN
INTRAVENOUS | Status: DC | PRN
  Administered 2022-10-23: 50 mg via INTRAVENOUS
  Administered 2022-10-23: 20 mg via INTRAVENOUS
  Administered 2022-10-23: 30 mg via INTRAVENOUS

## 2022-10-23 MED ORDER — METOCLOPRAMIDE HCL 10 MG PO TABS: 5.0000 mg | ORAL_TABLET | Freq: Three times a day (TID) | ORAL | Status: DC | PRN

## 2022-10-23 MED ORDER — CEFAZOLIN SODIUM-DEXTROSE 2-4 GM/100ML-% IV SOLN
2.0000 g | INTRAVENOUS | Status: AC
  Administered 2022-10-23: 2 g via INTRAVENOUS

## 2022-10-23 MED ORDER — PHENYLEPHRINE HCL (PRESSORS) 10 MG/ML IV SOLN
INTRAVENOUS | Status: DC | PRN
  Administered 2022-10-23: 80 ug via INTRAVENOUS
  Administered 2022-10-23: 160 ug via INTRAVENOUS

## 2022-10-23 MED ORDER — KETAMINE HCL 50 MG/5ML IJ SOSY
PREFILLED_SYRINGE | INTRAMUSCULAR | Status: AC
  Filled 2022-10-23: qty 5

## 2022-10-23 MED ORDER — BUPIVACAINE HCL (PF) 0.25 % IJ SOLN
INTRAMUSCULAR | Status: AC
  Filled 2022-10-23: qty 30

## 2022-10-23 MED ORDER — HYDROCODONE-ACETAMINOPHEN 5-325 MG PO TABS
1.0000 | ORAL_TABLET | ORAL | Status: AC
  Administered 2022-10-23: 1 via ORAL

## 2022-10-23 MED ORDER — 0.9 % SODIUM CHLORIDE (POUR BTL) OPTIME
TOPICAL | Status: DC | PRN
  Administered 2022-10-23: 500 mL

## 2022-10-23 MED ORDER — PROPOFOL 10 MG/ML IV BOLUS
INTRAVENOUS | Status: DC | PRN
  Administered 2022-10-23: 150 mg via INTRAVENOUS

## 2022-10-23 MED ORDER — HYDROCODONE-ACETAMINOPHEN 7.5-325 MG PO TABS: 1.0000 | ORAL_TABLET | Freq: Once | ORAL | Status: DC | PRN

## 2022-10-23 MED ORDER — PROPOFOL 1000 MG/100ML IV EMUL
INTRAVENOUS | Status: AC
  Filled 2022-10-23: qty 100

## 2022-10-23 MED ORDER — MIDAZOLAM HCL 2 MG/2ML IJ SOLN
INTRAMUSCULAR | Status: DC | PRN
  Administered 2022-10-23: 2 mg via INTRAVENOUS

## 2022-10-23 MED ORDER — CHLORHEXIDINE GLUCONATE 0.12 % MT SOLN: 15.0000 mL | Freq: Once | OROMUCOSAL | Status: AC

## 2022-10-23 MED ORDER — ONDANSETRON HCL 4 MG PO TABS: 4.0000 mg | ORAL_TABLET | Freq: Four times a day (QID) | ORAL | Status: DC | PRN

## 2022-10-23 MED ORDER — CHLORHEXIDINE GLUCONATE 0.12 % MT SOLN
OROMUCOSAL | Status: AC
  Filled 2022-10-23: qty 15

## 2022-10-23 MED ORDER — PROPOFOL 500 MG/50ML IV EMUL
INTRAVENOUS | Status: DC | PRN
  Administered 2022-10-23: 150 ug/kg/min via INTRAVENOUS

## 2022-10-23 MED ORDER — MIDAZOLAM HCL 2 MG/2ML IJ SOLN
INTRAMUSCULAR | Status: AC
  Filled 2022-10-23: qty 2

## 2022-10-23 MED ORDER — CEFAZOLIN SODIUM-DEXTROSE 2-4 GM/100ML-% IV SOLN
INTRAVENOUS | Status: AC
  Filled 2022-10-23: qty 100

## 2022-10-23 MED ORDER — ORAL CARE MOUTH RINSE
15.0000 mL | Freq: Once | OROMUCOSAL | Status: AC
  Administered 2022-10-23: 15 mL via OROMUCOSAL

## 2022-10-23 MED ORDER — PHENYLEPHRINE 80 MCG/ML (10ML) SYRINGE FOR IV PUSH (FOR BLOOD PRESSURE SUPPORT)
PREFILLED_SYRINGE | INTRAVENOUS | Status: AC
  Filled 2022-10-23: qty 10

## 2022-10-23 MED ORDER — PHENYLEPHRINE HCL-NACL 20-0.9 MG/250ML-% IV SOLN
INTRAVENOUS | Status: AC
  Filled 2022-10-23: qty 250

## 2022-10-23 MED ORDER — FENTANYL CITRATE (PF) 100 MCG/2ML IJ SOLN
INTRAMUSCULAR | Status: DC | PRN
  Administered 2022-10-23 (×4): 25 ug via INTRAVENOUS

## 2022-10-23 MED ORDER — ONDANSETRON HCL 4 MG/2ML IJ SOLN: 4.0000 mg | Freq: Four times a day (QID) | INTRAMUSCULAR | Status: DC | PRN

## 2022-10-23 MED ORDER — ROCURONIUM BROMIDE 10 MG/ML (PF) SYRINGE
PREFILLED_SYRINGE | INTRAVENOUS | Status: AC
  Filled 2022-10-23: qty 10

## 2022-10-23 MED ORDER — LIDOCAINE HCL (CARDIAC) PF 100 MG/5ML IV SOSY
PREFILLED_SYRINGE | INTRAVENOUS | Status: DC | PRN
  Administered 2022-10-23: 60 mg via INTRAVENOUS
  Administered 2022-10-23: 40 mg via INTRAVENOUS

## 2022-10-23 MED ORDER — HYDROMORPHONE HCL 1 MG/ML IJ SOLN: 0.2500 mg | INTRAMUSCULAR | Status: DC | PRN

## 2022-10-23 MED ORDER — HYDROCODONE-ACETAMINOPHEN 5-325 MG PO TABS: 1.0000 | ORAL_TABLET | ORAL | Status: DC | PRN

## 2022-10-23 MED ORDER — SUGAMMADEX SODIUM 200 MG/2ML IV SOLN
INTRAVENOUS | Status: DC | PRN
  Administered 2022-10-23: 200 mg via INTRAVENOUS

## 2022-10-23 MED ORDER — EPINEPHRINE PF 1 MG/ML IJ SOLN
INTRAMUSCULAR | Status: AC
  Filled 2022-10-23: qty 1

## 2022-10-23 MED ORDER — BUPIVACAINE LIPOSOME 1.3 % IJ SUSP
INTRAMUSCULAR | Status: AC
  Filled 2022-10-23: qty 20

## 2022-10-23 MED ORDER — ONDANSETRON HCL 4 MG/2ML IJ SOLN
INTRAMUSCULAR | Status: DC | PRN
  Administered 2022-10-23: 4 mg via INTRAVENOUS

## 2022-10-23 MED ORDER — FENTANYL CITRATE (PF) 100 MCG/2ML IJ SOLN
INTRAMUSCULAR | Status: AC
  Filled 2022-10-23: qty 2

## 2022-10-23 SURGICAL SUPPLY — 48 items
APL PRP STRL LF DISP 70% ISPRP (MISCELLANEOUS) ×2
BIT DRILL 2.3 QUICK RELEASE (BIT) IMPLANT
BIT DRILL 2.8X5 QR DISP (BIT) IMPLANT
BIT DRILL 3.0X5 QUICK RELEASE (DRILL) IMPLANT
BNDG CMPR 5X4 CHSV STRCH STRL (GAUZE/BANDAGES/DRESSINGS) ×1
BNDG COHESIVE 4X5 TAN STRL LF (GAUZE/BANDAGES/DRESSINGS) ×2 IMPLANT
CHLORAPREP W/TINT 26 (MISCELLANEOUS) ×4 IMPLANT
DRAPE C-ARM XRAY 36X54 (DRAPES) ×2 IMPLANT
DRAPE INCISE IOBAN 66X45 STRL (DRAPES) ×4 IMPLANT
DRILL 2.3 QUICK RELEASE (BIT) ×1
DRILL 3.0X5 QUICK RELEASE (DRILL) ×1
DRSG OPSITE POSTOP 4X6 (GAUZE/BANDAGES/DRESSINGS) ×2 IMPLANT
DRSG XEROFORM 1X8 (GAUZE/BANDAGES/DRESSINGS) IMPLANT
GAUZE SPONGE 4X4 12PLY STRL (GAUZE/BANDAGES/DRESSINGS) ×2 IMPLANT
GLOVE BIO SURGEON STRL SZ7.5 (GLOVE) ×4 IMPLANT
GLOVE BIO SURGEON STRL SZ8 (GLOVE) ×4 IMPLANT
GLOVE BIOGEL PI IND STRL 8 (GLOVE) ×4 IMPLANT
GLOVE INDICATOR 8.0 STRL GRN (GLOVE) ×2 IMPLANT
GOWN STRL REUS W/ TWL LRG LVL3 (GOWN DISPOSABLE) ×2 IMPLANT
GOWN STRL REUS W/ TWL XL LVL3 (GOWN DISPOSABLE) ×2 IMPLANT
GOWN STRL REUS W/TWL LRG LVL3 (GOWN DISPOSABLE) ×1
GOWN STRL REUS W/TWL XL LVL3 (GOWN DISPOSABLE) ×1
IMMOBILIZER SHDR LG LX 900803 (SOFTGOODS) ×2 IMPLANT
KIT STABILIZATION SHOULDER (MISCELLANEOUS) ×2 IMPLANT
KIT TURNOVER KIT A (KITS) ×2 IMPLANT
MANIFOLD NEPTUNE II (INSTRUMENTS) ×2 IMPLANT
MASK FACE SPIDER DISP (MASK) ×2 IMPLANT
MAT ABSORB FLUID 56X50 GRAY (MISCELLANEOUS) ×2 IMPLANT
NDL FILTER BLUNT 18X1 1/2 (NEEDLE) ×2 IMPLANT
NEEDLE FILTER BLUNT 18X1 1/2 (NEEDLE) ×1 IMPLANT
NS IRRIG 500ML POUR BTL (IV SOLUTION) ×2 IMPLANT
PACK ARTHROSCOPY SHOULDER (MISCELLANEOUS) ×2 IMPLANT
PLATE CLAV LOCK 6H SML (Plate) IMPLANT
SCREW BONE NLCKG 3.0X18 HEXA (Screw) IMPLANT
SCREW BONE NONLOCK 3.0X18 (Screw) ×1 IMPLANT
SCREW HEX LOCK 3.5X20MM (Screw) IMPLANT
SCREW HEXALOBE LOCKING 3.5X14M (Screw) IMPLANT
SCREW HEXALOBE NON-LOCK 3.5X14 (Screw) IMPLANT
SCREW NON LOCKING HEX 3.5X18MM (Screw) IMPLANT
SPONGE T-LAP 18X18 ~~LOC~~+RFID (SPONGE) ×2 IMPLANT
STAPLER SKIN PROX 35W (STAPLE) ×2 IMPLANT
STRIP CLOSURE SKIN 1/2X4 (GAUZE/BANDAGES/DRESSINGS) ×2 IMPLANT
SUT VIC AB 2-0 CT1 27 (SUTURE) ×2
SUT VIC AB 2-0 CT1 TAPERPNT 27 (SUTURE) ×4 IMPLANT
SUT VIC AB 2-0 CT2 27 (SUTURE) ×4 IMPLANT
SYR 10ML LL (SYRINGE) ×2 IMPLANT
TRAP FLUID SMOKE EVACUATOR (MISCELLANEOUS) ×2 IMPLANT
WATER STERILE IRR 500ML POUR (IV SOLUTION) ×2 IMPLANT
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
## 2022-10-23 NOTE — Discharge Instructions (Addendum)
Orthopedic discharge instructions: May shower with intact OpSite dressing.  Apply ice frequently to shoulder. Take hydrocodone as prescribed when needed.  May supplement with ES Tylenol if necessary. Keep shoulder immobilizer on at all times except may remove for bathing purposes. Follow-up in 10-14 days or as scheduled.  AMBULATORY SURGERY  DISCHARGE INSTRUCTIONS   The drugs that you were given will stay in your system until tomorrow so for the next 24 hours you should not:  Drive an automobile Make any legal decisions Drink any alcoholic beverage   You may resume regular meals tomorrow.  Today it is better to start with liquids and gradually work up to solid foods.  You may eat anything you prefer, but it is better to start with liquids, then soup and crackers, and gradually work up to solid foods.   Please notify your doctor immediately if you have any unusual bleeding, trouble breathing, redness and pain at the surgery site, drainage, fever, or pain not relieved by medication.    Information for Discharge Teaching:  DO REMOVE TEAL EXPAREL BREACLET FOR 4 days, 10/27/2022 EXPAREL (bupivacaine liposome injectable suspension)   Your surgeon or anesthesiologist gave you EXPAREL(bupivacaine) to help control your pain after surgery.  EXPAREL is a local anesthetic that provides pain relief by numbing the tissue around the surgical site. EXPAREL is designed to release pain medication over time and can control pain for up to 72 hours. Depending on how you respond to EXPAREL, you may require less pain medication during your recovery.  Possible side effects: Temporary loss of sensation or ability to move in the area where bupivacaine was injected. Nausea, vomiting, constipation Rarely, numbness and tingling in your mouth or lips, lightheadedness, or anxiety may occur. Call your doctor right away if you think you may be experiencing any of these sensations, or if you have other  questions regarding possible side effects.  Follow all other discharge instructions given to you by your surgeon or nurse. Eat a healthy diet and drink plenty of water or other fluids.  If you return to the hospital for any reason within 96 hours following the administration of EXPAREL, it is important for health care providers to know that you have received this anesthetic. A teal colored band has been placed on your arm with the date, time and amount of EXPAREL you have received in order to alert and inform your health care providers. Please leave this armband in place for the full 96 hours following administration, and then you may remove the band.        Please contact your physician with any problems or Same Day Surgery at [phone], Monday through Friday 6 am to 4 pm, or [place] [facility] number at [phone].

## 2022-10-23 NOTE — Anesthesia Procedure Notes (Signed)
Procedure Name: Intubation Date/Time: 10/23/2022 11:34 AM  Performed by: Lynden Oxford, CRNAPre-anesthesia Checklist: Patient identified, Emergency Drugs available, Suction available and Patient being monitored Patient Re-evaluated:Patient Re-evaluated prior to induction Oxygen Delivery Method: Circle system utilized Preoxygenation: Pre-oxygenation with 100% oxygen Induction Type: IV induction Ventilation: Mask ventilation without difficulty Laryngoscope Size: McGraph and 4 Grade View: Grade II Tube type: Oral Number of attempts: 1 Airway Equipment and Method: Stylet and Video-laryngoscopy Placement Confirmation: ETT inserted through vocal cords under direct vision, positive ETCO2 and breath sounds checked- equal and bilateral Secured at: 22 cm Tube secured with: Tape Dental Injury: Teeth and Oropharynx as per pre-operative assessment
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
## 2022-10-23 NOTE — Transfer of Care (Signed)
Immediate Anesthesia Transfer of Care Note  Patient: Gabriel Kienle Irmen Sr.  Procedure(s) Performed: OPEN REDUCTION INTERNAL FIXATION (ORIF) CLAVICULAR FRACTURE (Left: Shoulder)  Patient Location: PACU  Anesthesia Type:General  Level of Consciousness: drowsy  Airway & Oxygen Therapy: Patient Spontanous Breathing and Patient connected to face mask oxygen  Post-op Assessment: Report given to RN and Post -op Vital signs reviewed and stable  Post vital signs: Reviewed and stable  Last Vitals:  Vitals Value Taken Time  BP 160/93 10/23/22 1345  Temp 36 C 10/23/22 1332  Pulse 91 10/23/22 1346  Resp 14 10/23/22 1346  SpO2 100 % 10/23/22 1346  Vitals shown include unvalidated device data.  Last Pain:  Vitals:   10/23/22 0957  TempSrc:   PainSc: 3          Complications: No notable events documented.

## 2022-10-23 NOTE — Anesthesia Preprocedure Evaluation (Addendum)
Anesthesia Evaluation  Patient identified by MRN, date of birth, ID band Patient awake    Reviewed: Allergy & Precautions, NPO status , Patient's Chart, lab work & pertinent test results  History of Anesthesia Complications (+) PONV and history of anesthetic complications  Airway Mallampati: II  TM Distance: >3 FB Neck ROM: full    Dental no notable dental hx.    Pulmonary neg pulmonary ROS   Pulmonary exam normal        Cardiovascular hypertension, Normal cardiovascular exam     Neuro/Psych negative neurological ROS  negative psych ROS   GI/Hepatic negative GI ROS, Neg liver ROS,,,  Endo/Other  diabetes (s/p kidney transplant)    Renal/GU ESRFRenal disease (s/p kidney transplant)     Musculoskeletal   Abdominal   Peds  Hematology negative hematology ROS (+)   Anesthesia Other Findings Past Medical History: No date: BK viremia No date: Condyloma No date: Diabetic nephropathy (HCC) No date: Diabetic retinopathy (HCC) No date: ESRD on peritoneal dialysis [facility])     Comment:  a.) PD started in 01/2015; b.) s/p SPK transplant               03/18/2016 --> no longer on PD 2015: History of bilateral cataract extraction No date: History of kidney stones No date: Hypertension No date: Long term current use of immunosuppressive drug     Comment:  a.) mycophenolate + prednisone + tacrolimus +               valganciclovir No date: Nonmelanoma skin cancer No date: Peripheral edema No date: PONV (postoperative nausea and vomiting) 03/18/2016: Status post simultaneous kidney and pancreas transplant  (HCC)     Comment:  a.) s/p SPK 03/18/2016 --> pancreas failed leading to               second pancreas trasnplant on 07/26/2022 No date: Type 1 diabetes (HCC)     Comment:  a.) s/p pancreas transplant (SPK; combined               pancreas-kidney) on 03/18/2016 (unsuccessful/failed);               second transplant  07/26/2022  Past Surgical History: 12/06/2014: CAPD INSERTION; N/A     Comment:  Procedure: LAPAROSCOPIC INSERTION CONTINUOUS AMBULATORY               PERITONEAL DIALYSIS  (CAPD) CATHETER;  Surgeon: Renford Dills, MD;  Location: [facility] ORS;  Service: Vascular;                Laterality: N/A; 2015: CATARACT EXTRACTION W/ INTRAOCULAR LENS IMPLANT; Bilateral 03/18/2016: COMBINED KIDNEY-PANCREAS TRANSPLANT; Left No date: CYST REMOVAL TRUNK 06/02/2016: CYSTOURETHROSCOPY; N/A 1993: GYNECOMASTIA EXCISION 03/18/2016: PANCREATECTOMY 07/26/2022: TRANSPLANT PANCREATIC ALLOGRAFT; N/A     Comment:  Second transplant  BMI    Body Mass Index: 24.21 kg/m      Reproductive/Obstetrics negative OB ROS                              Anesthesia Physical Anesthesia Plan  ASA: 3  Anesthesia Plan: General ETT   Post-op Pain Management:    Induction: Intravenous  PONV Risk Score and Plan: 3 and Ondansetron, Dexamethasone, Treatment may vary due to age or medical condition, Propofol infusion, TIVA and Midazolam  Airway Management Planned: Oral ETT  Additional Equipment:  Intra-op Plan:   Post-operative Plan: Extubation in OR  Informed Consent: I have reviewed the patients History and Physical, chart, labs and discussed the procedure including the risks, benefits and alternatives for the proposed anesthesia with the patient or authorized representative who has indicated his/her understanding and acceptance.     Dental Advisory Given  Plan Discussed with: Anesthesiologist, CRNA and Surgeon  Anesthesia Plan Comments: (Patient consented for risks of anesthesia including but not limited to:  - adverse reactions to medications - damage to eyes, teeth, lips or other oral mucosa - nerve damage due to positioning  - sore throat or hoarseness - Damage to heart, brain, nerves, lungs, other parts of body or loss of life  Patient voiced understanding.)         Anesthesia Quick Evaluation

## 2022-10-23 NOTE — Anesthesia Postprocedure Evaluation (Signed)
Anesthesia Post Note  Patient: Gabriel Schult Godby Sr.  Procedure(s) Performed: OPEN REDUCTION INTERNAL FIXATION (ORIF) CLAVICULAR FRACTURE (Left: Shoulder)  Patient location during evaluation: PACU Anesthesia Type: General Level of consciousness: awake and alert Pain management: pain level controlled Vital Signs Assessment: post-procedure vital signs reviewed and stable Respiratory status: spontaneous breathing, nonlabored ventilation, respiratory function stable and patient connected to nasal cannula oxygen Cardiovascular status: blood pressure returned to baseline and stable Postop Assessment: no apparent nausea or vomiting Anesthetic complications: no   No notable events documented.   Last Vitals:  Vitals:   10/23/22 1345 10/23/22 1350  BP: (!) 160/93   Pulse: 90 93  Resp: 12 12  Temp:    SpO2: 100% 100%    Last Pain:  Vitals:   10/23/22 1332  TempSrc:   PainSc: Asleep                 Louie Boston

## 2022-10-24 ENCOUNTER — Encounter: Payer: Self-pay | Admitting: Surgery
